# Patient Record
Sex: Female | Born: 1935 | Race: White | Hispanic: No | Smoking: Never smoker
Health system: Southern US, Community
[De-identification: ages and names within clinical notes are randomized; demographics above are authoritative.]

## PROBLEM LIST (undated history)

## (undated) DIAGNOSIS — IMO0001 Reserved for inherently not codable concepts without codable children: Secondary | ICD-10-CM

## (undated) DIAGNOSIS — F0280 Dementia in other diseases classified elsewhere without behavioral disturbance: Secondary | ICD-10-CM

## (undated) DIAGNOSIS — G309 Alzheimer's disease, unspecified: Secondary | ICD-10-CM

## (undated) DIAGNOSIS — M4850XA Collapsed vertebra, not elsewhere classified, site unspecified, initial encounter for fracture: Secondary | ICD-10-CM

## (undated) DIAGNOSIS — F209 Schizophrenia, unspecified: Secondary | ICD-10-CM

## (undated) DIAGNOSIS — I10 Essential (primary) hypertension: Secondary | ICD-10-CM

## (undated) DIAGNOSIS — G8929 Other chronic pain: Secondary | ICD-10-CM

## (undated) DIAGNOSIS — K449 Diaphragmatic hernia without obstruction or gangrene: Secondary | ICD-10-CM

## (undated) DIAGNOSIS — K219 Gastro-esophageal reflux disease without esophagitis: Secondary | ICD-10-CM

## (undated) DIAGNOSIS — M549 Dorsalgia, unspecified: Secondary | ICD-10-CM

## (undated) DIAGNOSIS — A0472 Enterocolitis due to Clostridium difficile, not specified as recurrent: Secondary | ICD-10-CM

## (undated) HISTORY — PX: CAROTID ENDARTERECTOMY: SUR193

## (undated) HISTORY — PX: CAROTID STENT: SHX1301

## (undated) HISTORY — PX: LUNG REMOVAL, PARTIAL: SHX233

## (undated) HISTORY — PX: OTHER SURGICAL HISTORY: SHX169

---

## 2006-10-04 ENCOUNTER — Inpatient Hospital Stay (HOSPITAL_COMMUNITY): Admission: EM | Admit: 2006-10-04 | Discharge: 2006-10-10 | Payer: Self-pay | Admitting: Emergency Medicine

## 2006-11-03 ENCOUNTER — Emergency Department (HOSPITAL_COMMUNITY): Admission: EM | Admit: 2006-11-03 | Discharge: 2006-11-03 | Payer: Self-pay | Admitting: Emergency Medicine

## 2010-04-30 ENCOUNTER — Emergency Department (HOSPITAL_COMMUNITY): Admission: EM | Admit: 2010-04-30 | Discharge: 2010-04-30 | Payer: Self-pay | Admitting: Emergency Medicine

## 2010-10-06 ENCOUNTER — Encounter: Payer: Self-pay | Admitting: Orthopaedic Surgery

## 2010-11-30 LAB — CBC
Hemoglobin: 14 g/dL (ref 12.0–15.0)
MCH: 32.5 pg (ref 26.0–34.0)
MCV: 96.3 fL (ref 78.0–100.0)
Platelets: 101 10*3/uL — ABNORMAL LOW (ref 150–400)
RBC: 4.3 MIL/uL (ref 3.87–5.11)
RDW: 14.3 % (ref 11.5–15.5)

## 2010-11-30 LAB — DIFFERENTIAL
Basophils Absolute: 0 10*3/uL (ref 0.0–0.1)
Eosinophils Absolute: 0.1 10*3/uL (ref 0.0–0.7)
Eosinophils Relative: 2 % (ref 0–5)
Lymphocytes Relative: 17 % (ref 12–46)
Lymphs Abs: 1.3 10*3/uL (ref 0.7–4.0)
Monocytes Absolute: 0.8 10*3/uL (ref 0.1–1.0)
Monocytes Relative: 10 % (ref 3–12)
Neutro Abs: 5.5 10*3/uL (ref 1.7–7.7)
Neutrophils Relative %: 71 % (ref 43–77)

## 2010-11-30 LAB — COMPREHENSIVE METABOLIC PANEL
Albumin: 3.6 g/dL (ref 3.5–5.2)
BUN: 17 mg/dL (ref 6–23)
CO2: 31 mEq/L (ref 19–32)
Calcium: 9.4 mg/dL (ref 8.4–10.5)
GFR calc non Af Amer: >60 mL/min (ref >60)
Total Protein: 6.8 g/dL (ref 6.0–8.3)

## 2010-11-30 LAB — URINE CULTURE
Culture  Setup Time: 201108162045
Culture: NO GROWTH

## 2010-11-30 LAB — URINE MICROSCOPIC-ADD ON

## 2010-11-30 LAB — URINALYSIS, ROUTINE W REFLEX MICROSCOPIC
Bilirubin Urine: NEGATIVE
Nitrite: NEGATIVE
pH: 6 (ref 5.0–8.0)

## 2011-02-01 NOTE — Discharge Summary (Signed)
NAMEKIMBERLEE, [name] NO.:  [id number]   MEDICAL RECORD NO.:  [id number]          PATIENT TYPE:  INP   LOCATION:  3021                         FACILITY:  [facility]   PHYSICIAN:  Lonia Blood, M.D.       DATE OF BIRTH:  1923/11/24   DATE OF ADMISSION:  10/03/2006  DATE OF DISCHARGE:                               DISCHARGE SUMMARY   The patient's primary care physician is in [place], [place].   DISCHARGE DIAGNOSES:  1. Clostridium difficile colitis.  2. Alzheimer dementia.  3. Psychosis, not otherwise specified.  4. Hypertension.  5. Hyperlipidemia.  6. Status post left lung resection.  7. Status post right carotid endarterectomy December 2007.  8. Hypokalemia secondary to diarrhea, repleting.  9. Anemia of chronic disease.   MEDICATIONS:  1. Megace 400 mg daily.  2. Metronidazole 500 mg by mouth 3 times a day with a stop date of      October 26, 2006.  3. Seroquel 25 mg in the morning and 50 mg at bedtime.  4. Ensure one can four times a day.  5. Tylenol 650 mg by mouth every 4 hours as needed for pain.   CONDITION ON DISCHARGE:  The disposition is still unclear for this  patient.  Discussions are still ongoing with the daughter about the  patient returning home versus placement in assisted living versus short-  term skilled nursing home placement.   CONSULTATIONS DURING THIS ADMISSION:  We have requested a psychiatry  consultation for competency evaluation.   PROCEDURES DURING THIS ADMISSION:  1. On October 04, 2006, the patient underwent abdominal ultrasound      with finding of gallstones, bilateral renal cysts.  2. Chest x-ray October 06, 2005, with findings of left basilar      postoperative changes and scarring, no definite acute findings.   For admission history and physical, refer to the dictated H&P done by  Dr. Lovell Sheehan October 04, 2006.   HOSPITAL COURSE:  Problem 1.  DIARRHEA AND ABDOMINAL PAIN:  [name] was admitted to  [facility] with a presumed diagnosis of Clostridium difficile  colitis.  The diagnosis was confirmed shortly after admission by a  positive Clostridium difficile toxin assay.  [name] was taken off of  any offending antibiotics and she was kept on Flagyl orally throughout  the hospitalization .   Problem 2.  DEMENTIA WITH HISTORY OF PSYCHOSIS:  During this  hospitalization we have decreased the dose of Seroquel that [name]  was using because of increasing sedation.  Shortly after doing that, the  patient started having some paranoid ideations and we have obtained a  psychiatry consult.  The consult is pending at the time of my dictation.   Problem 3.  HYPERTENSION:  The patient's blood pressure has been  excellently controlled without any interventions.   Problem 4.  HYPOKALEMIA:  This was secondary to diarrhea and the  patient's potassium is getting repleted as we speak.   Problem 5.  The disposition is very unclear for this patient.  She has a  complicated social situation  with a history of placement in a long-term  facility where apparently the conditions were borderline acceptable.  Currently the patient's daughter and health care power of attorney is  attempting to place the patient in assisted living [place]  where there is another daughter living.      Lonia Blood, M.D.  Electronically Signed     SL/MEDQ  D:  10/07/2006  T:  10/07/2006  Job:  [id number]

## 2011-02-01 NOTE — H&P (Signed)
[name], [name]                  ACCOUNT NO.:  [id number]   MEDICAL RECORD NO.:  [id number]          PATIENT TYPE:  INP   LOCATION:  1828                         FACILITY:  [facility]   PHYSICIAN:  Della Goo, M.D. DATE OF BIRTH:  1923-11-13   DATE OF ADMISSION:  10/03/2006  DATE OF DISCHARGE:                              HISTORY & PHYSICAL   CHIEF COMPLAINT:  Abdominal pain.   HISTORY OF PRESENT ILLNESS:  This is an 77 year old female who was  brought to the emergency department by her daughter secondary to  complaints of abdominal pain and continued diarrhea.  The patient has  had diarrhea since January 2 and prior to this had a diarrheal illness  for several weeks and was hospitalized in a hospital in [place],  [place], secondary to severe dehydration.  The patient's daughter  reports the diarrhea is not as severe as before; however, it is foul-  smelling.  The patient recently moved in with her daughter from an  assisted living facility in [place], [place], and the patient's  daughter reports the patient has had decreased p.o. intake, fatigue and  lethargy during the past week.  The patient is able to give some of her  history of complaints.  However, most of the history has been obtained  from the patient's daughter.  The patient does have a history of  Alzheimer's dementia.   PAST MEDICAL HISTORY:  1. Alzheimer's dementia.  2. Hyperlipidemia.  3. Hypertension.  4. Osteopenia.   PAST SURGICAL HISTORY:  1. Status post left lung resection secondary to a benign nodule.  2. Status post right carotid endarterectomy December26,2007.   MEDICATIONS:  1. Depakote 250 mg, this is the extended-release tablets, one p.o.      b.i.d.  2. Triamterene/hydrochlorothiazide 37.5/25 mg one p.o. q.a.m.  3. Zocor 20 mg one p.o. daily.  4. Seroquel 50 mg one p.o. q.a.m. and 100 mg p.o. q.h.s.  5. Aricept 5 mg one p.o. daily.  6. Potassium chloride 20 mEq one p.o. daily.  7. Colace  had been discontinued secondary to the patient's diarrhea.   ALLERGIES:  CODEINE.   SOCIAL HISTORY:  The patient is currently living with her [place]  No history of tobacco or alcohol usage.   FAMILY HISTORY:  Noncontributory.   REVIEW OF SYSTEMS:  Pertinents are mentioned above.   LABORATORY STUDIES:  White blood cell count 11.6, hemoglobin 10.9,  hematocrit 32.6, platelets 380, neutrophils 79%, lymphocytes 12%.  Urinalysis:  Large leukocyte esterase and positive urine nitrites.  Sodium 138, potassium 4.2, chloride 96, CO2 32, BUN 19, creatinine 0.99,  glucose 116, AST 21, ALT 12.  Lipase 112.  Valproic acid 11.5.   ASSESSMENT:  An 77 year old female admitted with complaints of abdominal  pain, found to have:   1. A urinary tract infection.  2. Chronic diarrhea, rule out Clostridium difficile.  3. Dehydration.  4. Normocytic anemia.  5. History of Alzheimer's.   PLAN:  The patient will be admitted and placed on IV antibiotic therapy  of Rocephin and Cipro.  She will  be placed on IV fluids for rehydration  therapy.  Stool will be sent for C. difficile and C&S.  The antibiotic  treatment will be further adjusted pending culture results.  The patient  will continue on her regular medications at this time.      Della Goo, M.D.  Electronically Signed     HJ/MEDQ  D:  10/04/2006  T:  10/04/2006  Job:  [id number]

## 2011-02-01 NOTE — Discharge Summary (Signed)
[name], [name] NO.:  [id number]   MEDICAL RECORD NO.:  [id number]          PATIENT TYPE:  INP   LOCATION:  3021                         FACILITY:  [facility]   PHYSICIAN:  Marcellus Scott, MD     DATE OF BIRTH:  1923/11/11   DATE OF ADMISSION:  10/03/2006  DATE OF DISCHARGE:  10/10/2006                               DISCHARGE SUMMARY   ADDENDUM AND FINAL DISCHARGE SUMMARY:  The patient's primary care  physician is in [place], [place], so the patient is unassigned to the  [employer].   This is an addendum discharge summary which will outline the events of  this patient's inpatient care from October 08, 2006, to date.  For  details of her inpatient care prior to that, please refer to the history  and physical note done on October 04, 2006, and the interim discharge  summary that was done by Dr. Lavera Guise on October 07, 2006.   DISCHARGE DIAGNOSES:  1. Clostridium difficile colitis.  2. Hypokalemia.  3. Hypomagnesemia.  4. Anemia.  5. Dyslipidemia.  6. 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  7. Dementia.  8. Hypertension.   MEDICATIONS:  1. Depakote Extended Release 250 mg orally twice daily.  2. Zocor 20 mg p.o. q.h.s.  3. FloraQ one capsule p.o. b.i.d. through Oct 26, 2006  4. Pepcid 20 mg p.o. daily.  5. Nu-Iron 150 mg p.o. daily.  6. Megace 400 mg p.o. daily.  7. Metronidazole 500 mg p.o. three times a day through October 26, 2006.  8. Seroquel 25 mg p.o. daily and 50 mg p.o. q.h.s.  9. Aricept 10 mg p.o. q.h.s.  10.Ensure one can p.o. four times a day.  11.Tylenol 650 mg p.o. q.4-6h. p.r.n. for pain.   PROCEDURES DONE SINCE JANUARY 22:  None.   CONSULTATIONS:  1. Psychiatry, Antonietta Breach, M.D.  2. Speech therapy.  3. Physical therapy and occupational therapy.   HOSPITAL COURSE AND CONDITION OF PATIENT ON DISCHARGE:  Since January  22, the patient's diarrhea was noted to progressively decrease and today  she  has only had one BM, which was formed stool, over the last 24 hours.  The patient says she is feeling much better.  She has remained afebrile  with no leukocytosis, and the patient is to complete her course of  Flagyl.  Her Sherre Lain can also be continued until February 10 and then  discontinued.  The patient accidentally received a single dose of  Protonix, Lasix and Renagel on the 23rd.  Her input-outputs were closely  monitored.  The patient was also briefly placed on intravenous fluid  hydration.  Her electrolytes of hypokalemia and hypomagnesemia were  checked and repleted.  Her daughter was informed of this event and she  verbalized understanding.  The patient was also seen by psychiatry on  October 08, 2006.  Please refer to their consult note for further  details.  They essentially assessed that the patient did not have the  capacity for informed consent and they suggested to avoid excess  Seroquel because of  side effects and suggested increase only for return  of hallucinations and delusions. If there was any increase in agitation  only, they suggested increasing the dose of Depakote.  They also  recommended following her CBCs and hepatic panel periodically while on  Depakote.  They also suggested increasing her Aricept to 10 mg p.o.  q.h.s., which has been done.  The patient has not demonstrated any  further hallucinations or delusions or agitation at this time.  Her  anemia workup is outlined as below.  It has been stable and further  workup can be done as an outpatient as deemed necessary by her primary  care physicians.   The patient had a speech and swallow evaluation, which was initially  suboptimal because of her being sedated on Seroquel.  A repeat swallow  evaluation was done today, January 25.  Please refer to their  recommendations for details, but they have suggested a regular/thin diet  and no further speech therapy evaluation.  She was also evaluated by  physical  therapy and occupational therapy, and they are okay with the  patient being transferred to an assisted living facility as expressed by  her daughter and health care power of attorney.  I have been calling the  patient's daughter on a daily basis, updating her on her mother's  progress in the hospital, and she has verbalized understanding.  The  patient carries a diagnosis of hypertension; however, thiazide diuretics  have been held in the hospital secondary to her presenting with diarrhea  and hypokalemia.  Even now the blood pressures are controlled well  without any medications.  This has to be reassessed as an outpatient and  if requiring antihypertensives, thiazide diuretics are probably not the  best choice for this elderly woman who is prone for multiple electrolyte  abnormalities.   LABORATORY DATA:  Most recent CBC:  Hemoglobin of 9.9, hematocrit of  28.9, MCV of 87.8, white blood cells 6.9, platelets of 303.  Her basic  metabolic panel:  Sodium 141, potassium 3.5, chloride 103, bicarb 29,  glucose 110, BUN 4, creatinine 0.76, calcium of 8.8.  Magnesium of 1.8.  Phosphorus of 2.5.  TSH of 0.852.  Anemia workup with iron less than 10,  a ferritin of 417, suggesting an anemia of chronic disease, vitamin B12  of 1884, folate of 953.  Her stool culture is negative.  Urine cultures  were negative.  Stool C. difficile was positive.      Marcellus Scott, MD  Electronically Signed     AH/MEDQ  D:  10/10/2006  T:  10/10/2006  Job:  [id number]

## 2012-01-24 DIAGNOSIS — R109 Unspecified abdominal pain: Secondary | ICD-10-CM | POA: Diagnosis not present

## 2012-01-24 DIAGNOSIS — R82998 Other abnormal findings in urine: Secondary | ICD-10-CM | POA: Diagnosis not present

## 2012-01-24 DIAGNOSIS — N952 Postmenopausal atrophic vaginitis: Secondary | ICD-10-CM | POA: Diagnosis not present

## 2012-03-10 DIAGNOSIS — N281 Cyst of kidney, acquired: Secondary | ICD-10-CM | POA: Diagnosis not present

## 2012-03-10 DIAGNOSIS — R82998 Other abnormal findings in urine: Secondary | ICD-10-CM | POA: Diagnosis not present

## 2012-03-10 DIAGNOSIS — N952 Postmenopausal atrophic vaginitis: Secondary | ICD-10-CM | POA: Diagnosis not present

## 2012-07-21 DIAGNOSIS — R109 Unspecified abdominal pain: Secondary | ICD-10-CM | POA: Diagnosis not present

## 2012-07-21 DIAGNOSIS — N952 Postmenopausal atrophic vaginitis: Secondary | ICD-10-CM | POA: Diagnosis not present

## 2012-07-29 DIAGNOSIS — F200 Paranoid schizophrenia: Secondary | ICD-10-CM | POA: Diagnosis not present

## 2012-08-26 DIAGNOSIS — F200 Paranoid schizophrenia: Secondary | ICD-10-CM | POA: Diagnosis not present

## 2012-10-02 ENCOUNTER — Encounter (HOSPITAL_COMMUNITY): Payer: Self-pay | Admitting: *Deleted

## 2012-10-02 ENCOUNTER — Emergency Department (HOSPITAL_COMMUNITY)
Admission: EM | Admit: 2012-10-02 | Discharge: 2012-10-03 | DRG: 379 | Disposition: A | Discharge status: Home / Self Care | Payer: Medicare Other | Attending: Emergency Medicine | Admitting: Emergency Medicine

## 2012-10-02 DIAGNOSIS — F209 Schizophrenia, unspecified: Secondary | ICD-10-CM | POA: Diagnosis present

## 2012-10-02 DIAGNOSIS — K625 Hemorrhage of anus and rectum: Secondary | ICD-10-CM | POA: Diagnosis present

## 2012-10-02 DIAGNOSIS — K219 Gastro-esophageal reflux disease without esophagitis: Secondary | ICD-10-CM | POA: Diagnosis present

## 2012-10-02 DIAGNOSIS — G309 Alzheimer's disease, unspecified: Secondary | ICD-10-CM | POA: Diagnosis present

## 2012-10-02 DIAGNOSIS — F0280 Dementia in other diseases classified elsewhere without behavioral disturbance: Secondary | ICD-10-CM | POA: Diagnosis not present

## 2012-10-02 DIAGNOSIS — K922 Gastrointestinal hemorrhage, unspecified: Secondary | ICD-10-CM

## 2012-10-02 DIAGNOSIS — I10 Essential (primary) hypertension: Secondary | ICD-10-CM | POA: Diagnosis present

## 2012-10-02 DIAGNOSIS — Z79899 Other long term (current) drug therapy: Secondary | ICD-10-CM | POA: Diagnosis not present

## 2012-10-02 DIAGNOSIS — Z7982 Long term (current) use of aspirin: Secondary | ICD-10-CM | POA: Diagnosis not present

## 2012-10-02 DIAGNOSIS — F0390 Unspecified dementia without behavioral disturbance: Secondary | ICD-10-CM | POA: Diagnosis present

## 2012-10-02 HISTORY — DX: Essential (primary) hypertension: I10

## 2012-10-02 HISTORY — DX: Gastro-esophageal reflux disease without esophagitis: K21.9

## 2012-10-02 HISTORY — DX: Dementia in other diseases classified elsewhere, unspecified severity, without behavioral disturbance, psychotic disturbance, mood disturbance, and anxiety: F02.80

## 2012-10-02 HISTORY — DX: Schizophrenia, unspecified: F20.9

## 2012-10-02 HISTORY — DX: Alzheimer's disease, unspecified: G30.9

## 2012-10-02 HISTORY — DX: Reserved for inherently not codable concepts without codable children: IMO0001

## 2012-10-02 LAB — BASIC METABOLIC PANEL
BUN: 18 mg/dL (ref 6–23)
CO2: 32 mEq/L (ref 19–32)
Calcium: 9.9 mg/dL (ref 8.4–10.5)
Chloride: 100 mEq/L (ref 96–112)
GFR calc Af Amer: 63 mL/min — ABNORMAL LOW (ref >90)
Glucose, Bld: 116 mg/dL — ABNORMAL HIGH (ref 70–99)
Potassium: 3.7 mEq/L (ref 3.5–5.1)
Sodium: 137 mEq/L (ref 135–145)

## 2012-10-02 LAB — CBC WITH DIFFERENTIAL/PLATELET
Band Neutrophils: 0 % (ref 0–10)
Eosinophils Relative: 3 % (ref 0–5)
HCT: 36.1 % (ref 36.0–46.0)
Hemoglobin: 12.1 g/dL (ref 12.0–15.0)
Lymphs Abs: 1.9 10*3/uL (ref 0.7–4.0)
MCV: 90.5 fL (ref 78.0–100.0)
Monocytes Absolute: 0.6 10*3/uL (ref 0.1–1.0)
Promyelocytes Absolute: 0 %
RBC: 3.99 MIL/uL (ref 3.87–5.11)
RDW: 12.1 % (ref 11.5–15.5)
nRBC: 0 /100 WBC

## 2012-10-02 NOTE — ED Provider Notes (Addendum)
History     CSN: [id number]  Arrival date & time 10/02/12  2123   First MD Initiated Contact with Patient 10/02/12 2310      Chief Complaint  Patient presents with  . Vaginal Bleeding    (Consider location/radiation/quality/duration/timing/severity/associated sxs/prior treatment) HPI  Level 5 caveat due to dementia Lawonda Pretlow Racette is a 79 y.o. female who presents to the Emergency Department complaining of vaginal bleeding that began an hour ago.Per daughter she had bleeding from either the rectum or the vagina. When the daughter looked she thought it was coming from the vagina. Patient has had normal stools for the past several days. Denies any recent fever, chills, vomiting, c/o abdominal pain. Patient has dementia and schizophrenia. Requires total care which is being provided in the home.   PCP Dr. Janna Arch Past Medical History  Diagnosis Date  . Hypertension   . Reflux   . Schizophrenia   . Alzheimer disease     Past Surgical History  Procedure Date  . Carotid stent   . Tumor of lung -benign , and removed.     History reviewed. No pertinent family history.  History  Substance Use Topics  . Smoking status: Never Smoker   . Smokeless tobacco: Not on file  . Alcohol Use: No    OB History    Grav Para Term Preterm Abortions TAB SAB Ect Mult Living                  Review of Systems  Unable to perform ROS: Dementia    Allergies  Codeine  Home Medications   Current Outpatient Rx  Name  Route  Sig  Dispense  Refill  . ASPIRIN EC 81 MG PO TBEC   Oral   Take 81 mg by mouth every evening.         Marland Kitchen ESTRADIOL 2 MG VA RING   Vaginal   Place 2 mg vaginally every 3 (three) months. follow package directions         . LISINOPRIL-HYDROCHLOROTHIAZIDE 20-12.5 MG PO TABS   Oral   Take 1 tablet by mouth daily.         . ADULT MULTIVITAMIN W/MINERALS CH   Oral   Take 1 tablet by mouth daily.         Marland Kitchen OLANZAPINE 5 MG PO TBDP   Oral   Take 5 mg by mouth  at bedtime.         Marland Kitchen PANTOPRAZOLE SODIUM 20 MG PO TBEC   Oral   Take 20 mg by mouth 2 (two) times daily.         Marland Kitchen VITAMIN C 500 MG PO TABS   Oral   Take 500 mg by mouth daily.           BP 141/66  Pulse 64  Temp 98.4 F (36.9 C) (Oral)  Resp 20  Ht 5\' 1"  (1.549 m)  Wt 128 lb (58.06 kg)  BMI 24.19 kg/m2  SpO2 95%  Physical Exam  Constitutional: She appears well-developed and well-nourished.  HENT:  Head: Normocephalic and atraumatic.  Right Ear: External ear normal.  Left Ear: External ear normal.  Mouth/Throat: Oropharynx is clear and moist.  Eyes: EOM are normal. Pupils are equal, round, and reactive to light.  Neck: Normal range of motion.  Cardiovascular: Normal heart sounds.   Pulmonary/Chest: Breath sounds normal.  Abdominal: Bowel sounds are normal.  Genitourinary:       Pelvic exam performed with permission of  daughter  and female ED tech assist during exam.  External genitalia w/o lesions. Vaginal vault without discharge.  Cervix w/o lesions, not friable, No blood in the vaginal vault or from the cervix.   Bimanual exam w/o CMT, uterine or adenexal tenderness.Rectal exam with soft stool in the rectal vault, brown, guaiac positive.   Musculoskeletal: Normal range of motion.  Neurological: She is alert. She has normal reflexes.    ED Course  Procedures (including critical care time)  Results for orders placed during the hospital encounter of 10/02/12  CBC WITH DIFFERENTIAL      Component Value Range   WBC 7.2  4.0 - 10.5 K/uL   RBC 3.99  3.87 - 5.11 MIL/uL   Hemoglobin 12.1  12.0 - 15.0 g/dL   HCT 09.8  11.9 - 14.7 %   MCV 90.5  78.0 - 100.0 fL   MCH 30.3  26.0 - 34.0 pg   MCHC 33.5  30.0 - 36.0 g/dL   RDW 82.9  56.2 - 13.0 %   Platelets 231  150 - 400 K/uL   Neutrophils Relative 60  43 - 77 %   Lymphocytes Relative 27  12 - 46 %   Monocytes Relative 9  3 - 12 %   Eosinophils Relative 3  0 - 5 %   Basophils Relative 1  0 - 1 %   Band  Neutrophils 0  0 - 10 %   Metamyelocytes Relative 0     Myelocytes 0     Promyelocytes Absolute 0     Blasts 0     nRBC 0  0 /100 WBC   Neutro Abs 4.4  1.7 - 7.7 K/uL   Lymphs Abs 1.9  0.7 - 4.0 K/uL   Monocytes Absolute 0.6  0.1 - 1.0 K/uL   Eosinophils Absolute 0.2  0.0 - 0.7 K/uL   Basophils Absolute 0.1  0.0 - 0.1 K/uL  BASIC METABOLIC PANEL      Component Value Range   Sodium 137  135 - 145 mEq/L   Potassium 3.7  3.5 - 5.1 mEq/L   Chloride 100  96 - 112 mEq/L   CO2 32  19 - 32 mEq/L   Glucose, Bld 116 (*) 70 - 99 mg/dL   BUN 18  6 - 23 mg/dL   Creatinine, Ser 8.65  0.50 - 1.10 mg/dL   Calcium 9.9  8.4 - 78.4 mg/dL   GFR calc non Af Amer 54 (*) >90 mL/min   GFR calc Af Amer 63 (*) >90 mL/min   12:04 AM:  T/C to Dr. Phillips Odor, hospitalist, case discussed, including:  HPI, pertinent PM/SHx, VS/PE, dx testing, ED course and treatment.  Agreeable to admission.  Requests to write temporary orders, telemetry bed.   MDM  Patient presents with what was thought to be vaginal bleeding. On physical exam bleeding was from the rectum, brown stool which guaiaced positive. Vital signs are stable. Hgb is stable at 12.1. Will arrange for admission for further work up of possible GI bleeding.Spoke with Dr. Phillips Odor, hospitalist, who will admit the patient. Pt stable in ED with no significant deterioration in condition.The patient appears reasonably stabilized for admission considering the current resources, flow, and capabilities available in the ED at this time, and I doubt any other [facility] requiring further screening and/or treatment in the ED prior to admission.  MDM Reviewed: nursing note and vitals Interpretation: labs     Nicoletta Dress. Colon Branch, MD 10/03/12 6962  [id number] Discussed  patient with Dr. Phillips Odor. She has spent time with the daughter and discussed the pros and cons of admission. Daughter would like to take the patient home. Dr. Phillips Odor has approved of discharge home. Referral will be made to  GI. Daughter advised to return to the ER if bleeding resumes.   Nicoletta Dress. Colon Branch, MD 10/03/12 [phone]

## 2012-10-02 NOTE — ED Notes (Addendum)
Vaginal bleeding. X 1 hour.  Hx of vaginal dryness and uses a estrogen ring to treat pt.Last placed in October. Pt sleeping in triage, daughter says she took her schizophrenia med tonight.

## 2012-10-03 DIAGNOSIS — K922 Gastrointestinal hemorrhage, unspecified: Secondary | ICD-10-CM

## 2012-10-03 DIAGNOSIS — K625 Hemorrhage of anus and rectum: Secondary | ICD-10-CM | POA: Diagnosis present

## 2012-10-03 DIAGNOSIS — F209 Schizophrenia, unspecified: Secondary | ICD-10-CM | POA: Diagnosis present

## 2012-10-03 DIAGNOSIS — F0390 Unspecified dementia without behavioral disturbance: Secondary | ICD-10-CM | POA: Diagnosis present

## 2012-10-03 NOTE — Progress Notes (Signed)
Call received from patient's daughter that when she got patient home bleeding reoccurred -when she inspected where the blood was coming from she clearly feels that it is vaginal, despite a normal but technically challenging exam in the ED this PM- patient had no blood around rectum no bowel movements and when daughter looked at the area.blood was pooled in vaginal introitus. She is on Estring for UTI prevention and is at risk for postmenopausal uterine hypertrophy on estrogen and also bleeding. I advised patient's daughter to have the ring removed ASAP and have complete evaluation by [facility] who is the prescriber. I again educated her on signs that her mother may be having clinically significant blood loss-ie soaking 2-3 pads in an hour or any maroon or bloody loose stools. She will follow up with PCP and call urology on Monday AM.

## 2012-10-03 NOTE — ED Notes (Signed)
Pts daughter spoke with EDP and decided to wait to speak with Dr. Phillips Odor prior to deciding if her mother should be admitted or not

## 2012-10-03 NOTE — ED Notes (Signed)
Await further orders as pts daughter would like to speak with admitting MD prior to making her decision about her mother being admitted

## 2012-10-03 NOTE — ED Notes (Signed)
When EDP performed rectal exam and checked hemocult, the hemocult was positive

## 2012-10-03 NOTE — Consult Note (Addendum)
Admission requested for [name] who was brought in by her daughter for BRBPR that occurred this afternoon. Admission accepted but when I arrived to the room the patient's daughter was questioning whether she wanted her be admitted. We had a detailed discussion about the patients overall condition which includes advanced dementia and behavioral disturbance secondary to schizophrenia. She has a long and complicated medical history and per the daughter it has taken them years to get her controlled enough to be at home- she also is frightened of her mother getting a hospital acquired infection-she has her mother wearing a mask and gloves in the ED room. She has history of hospital aquired C. Diff that per daughter "almost killed her". Since being in the ED she has had no more rectal bleeding-history is consistent with either hemorrhoid rupture or fissure, nothing to suggest brisk bleed or diverticular bleed. She had severe constipation about 4-5 days ago which required enemas and aggressive bowel regimen-so there may have been some trauma associated with that. Patient also starting to show signs of delirium and worsening confusion.  Recommendation: 1. Probably safe to discharge home-I educated daughter on red flag signs and symptoms and to return if significant re-bleed occurs.  2. Daily Stool Softner OTC Colace.  3. Consider outpatient GI consultation if problem continues- we discussed risks and benefits of aggressive care vs. Comfort care and QOL.   4. Hold ASA for 3 days.  Discussed with Dr. Colon Branch who will complete d/c.

## 2012-10-05 LAB — OCCULT BLOOD, POC DEVICE: Fecal Occult Bld: POSITIVE — AB

## 2012-11-23 DIAGNOSIS — F200 Paranoid schizophrenia: Secondary | ICD-10-CM | POA: Diagnosis not present

## 2013-02-01 DIAGNOSIS — E212 Other hyperparathyroidism: Secondary | ICD-10-CM | POA: Diagnosis not present

## 2013-02-01 DIAGNOSIS — M1990 Unspecified osteoarthritis, unspecified site: Secondary | ICD-10-CM | POA: Diagnosis not present

## 2013-02-01 DIAGNOSIS — I119 Hypertensive heart disease without heart failure: Secondary | ICD-10-CM | POA: Diagnosis not present

## 2013-02-01 DIAGNOSIS — L0390 Cellulitis, unspecified: Secondary | ICD-10-CM | POA: Diagnosis not present

## 2013-02-01 DIAGNOSIS — L0291 Cutaneous abscess, unspecified: Secondary | ICD-10-CM | POA: Diagnosis not present

## 2013-02-03 DIAGNOSIS — I739 Peripheral vascular disease, unspecified: Secondary | ICD-10-CM | POA: Diagnosis not present

## 2013-02-15 ENCOUNTER — Emergency Department (HOSPITAL_COMMUNITY): Payer: Medicare Other

## 2013-02-15 ENCOUNTER — Encounter (HOSPITAL_COMMUNITY): Payer: Self-pay | Admitting: *Deleted

## 2013-02-15 ENCOUNTER — Emergency Department (HOSPITAL_COMMUNITY)
Admission: EM | Admit: 2013-02-15 | Discharge: 2013-02-15 | Disposition: A | Discharge status: Home / Self Care | Payer: Medicare Other | Attending: Emergency Medicine | Admitting: Emergency Medicine

## 2013-02-15 DIAGNOSIS — I10 Essential (primary) hypertension: Secondary | ICD-10-CM | POA: Insufficient documentation

## 2013-02-15 DIAGNOSIS — Z043 Encounter for examination and observation following other accident: Secondary | ICD-10-CM | POA: Diagnosis not present

## 2013-02-15 DIAGNOSIS — W19XXXA Unspecified fall, initial encounter: Secondary | ICD-10-CM

## 2013-02-15 DIAGNOSIS — Y9389 Activity, other specified: Secondary | ICD-10-CM | POA: Insufficient documentation

## 2013-02-15 DIAGNOSIS — F0280 Dementia in other diseases classified elsewhere without behavioral disturbance: Secondary | ICD-10-CM | POA: Insufficient documentation

## 2013-02-15 DIAGNOSIS — W1809XA Striking against other object with subsequent fall, initial encounter: Secondary | ICD-10-CM | POA: Insufficient documentation

## 2013-02-15 DIAGNOSIS — S0990XA Unspecified injury of head, initial encounter: Secondary | ICD-10-CM | POA: Insufficient documentation

## 2013-02-15 DIAGNOSIS — G8929 Other chronic pain: Secondary | ICD-10-CM | POA: Diagnosis not present

## 2013-02-15 DIAGNOSIS — K219 Gastro-esophageal reflux disease without esophagitis: Secondary | ICD-10-CM | POA: Insufficient documentation

## 2013-02-15 DIAGNOSIS — M549 Dorsalgia, unspecified: Secondary | ICD-10-CM | POA: Diagnosis not present

## 2013-02-15 DIAGNOSIS — Z7982 Long term (current) use of aspirin: Secondary | ICD-10-CM | POA: Diagnosis not present

## 2013-02-15 DIAGNOSIS — Z87828 Personal history of other (healed) physical injury and trauma: Secondary | ICD-10-CM | POA: Diagnosis not present

## 2013-02-15 DIAGNOSIS — Z79899 Other long term (current) drug therapy: Secondary | ICD-10-CM | POA: Insufficient documentation

## 2013-02-15 DIAGNOSIS — S79919A Unspecified injury of unspecified hip, initial encounter: Secondary | ICD-10-CM | POA: Diagnosis not present

## 2013-02-15 DIAGNOSIS — Y9289 Other specified places as the place of occurrence of the external cause: Secondary | ICD-10-CM | POA: Insufficient documentation

## 2013-02-15 DIAGNOSIS — F209 Schizophrenia, unspecified: Secondary | ICD-10-CM | POA: Diagnosis not present

## 2013-02-15 DIAGNOSIS — T1490XA Injury, unspecified, initial encounter: Secondary | ICD-10-CM | POA: Diagnosis not present

## 2013-02-15 DIAGNOSIS — S79929A Unspecified injury of unspecified thigh, initial encounter: Secondary | ICD-10-CM | POA: Diagnosis not present

## 2013-02-15 DIAGNOSIS — G309 Alzheimer's disease, unspecified: Secondary | ICD-10-CM | POA: Insufficient documentation

## 2013-02-15 DIAGNOSIS — W010XXA Fall on same level from slipping, tripping and stumbling without subsequent striking against object, initial encounter: Secondary | ICD-10-CM | POA: Insufficient documentation

## 2013-02-15 HISTORY — DX: Other chronic pain: G89.29

## 2013-02-15 HISTORY — DX: Dorsalgia, unspecified: M54.9

## 2013-02-15 NOTE — ED Notes (Signed)
Pt to department via EMS.  Per report, pt fell over trying to put on slippers.  Pt didn't c/o injury to EMS, upon arrival to department, pt c/o back pain.  Pt does have history of back problems.  Pt has history of Alzheimer's, and is poor historian.  Family expected to arrive in department.  Not available at present.

## 2013-02-15 NOTE — ED Provider Notes (Signed)
History  This chart was scribed for Donnetta Hutching, MD by Ardelia Mems, ED Scribe. This patient was seen in room APA03/APA03 and the patient's care was started at 8:03 PM.   CSN: [id number]  Arrival date & time 02/15/13  1931     Chief Complaint  Patient presents with  . Fall     The history is provided by a relative. The history is limited by the condition of the patient. No language interpreter was used.   Level 5 Caveat secondary to alzheimer's, dementia and schizophrenia.  HPI Comments: [name] is a 79 y.o. Female with a h/o Alzheimer's, dementia, schizophrenia and chronic back pain brought in by EMS who presents to the Emergency Department complaining of a fall that occurred earlier today. Pt is a poor historian. Pt was attempting to put her shoes on and slipped and fell according to relative. Relative states that pt hit her head on the hardwood. Pt normally walks and stands only with assistance. Relative states that pt has a h/o spinal injuries and that her bones are brittle. Relative states that pt's behavior is normal today and that pt is heavily medicated to combat her agitation. Relative seems to be hypervigilant and also states that pt's breathing is not normal today. Pt has h/o of blockage and stents in both of her carotid arteries.  Past Medical History  Diagnosis Date  . Hypertension   . Reflux   . Schizophrenia   . Alzheimer disease   . Chronic back pain     Past Surgical History  Procedure Laterality Date  . Carotid stent    . Tumor of lung -benign , and removed.      History reviewed. No pertinent family history.  History  Substance Use Topics  . Smoking status: Never Smoker   . Smokeless tobacco: Not on file  . Alcohol Use: No    OB History   Grav Para Term Preterm Abortions TAB SAB Ect Mult Living                  Review of Systems  Unable to perform ROS: Dementia    Allergies  Codeine  Home Medications   Current Outpatient Rx  Name   Route  Sig  Dispense  Refill  . aspirin EC 81 MG tablet   Oral   Take 81 mg by mouth every evening.         Marland Kitchen estradiol (ESTRING) 2 MG vaginal ring   Vaginal   Place 2 mg vaginally every 3 (three) months. follow package directions         . lisinopril-hydrochlorothiazide (PRINZIDE,ZESTORETIC) 20-12.5 MG per tablet   Oral   Take 1 tablet by mouth daily.         . Multiple Vitamin (MULTIVITAMIN WITH MINERALS) TABS   Oral   Take 1 tablet by mouth daily.         Marland Kitchen OLANZapine zydis (ZYPREXA) 5 MG disintegrating tablet   Oral   Take 5 mg by mouth at bedtime.         . pantoprazole (PROTONIX) 20 MG tablet   Oral   Take 20 mg by mouth 2 (two) times daily.         . vitamin C (ASCORBIC ACID) 500 MG tablet   Oral   Take 500 mg by mouth daily.           Triage Vitals: BP 150/94  Pulse 92  Temp(Src) 98 F (36.7 C) (Oral)  Resp 20  SpO2 95%  Physical Exam  Nursing note and vitals reviewed. HENT:  Head: Normocephalic and atraumatic.  Eyes:  Unable.  Neck: Normal range of motion. Neck supple.  Cardiovascular: Normal rate, regular rhythm and normal heart sounds.   Pulmonary/Chest: Effort normal and breath sounds normal.  Abdominal: Soft. Bowel sounds are normal.  Musculoskeletal:  unable  Neurological: She has normal reflexes.  unable  Skin: Skin is warm and dry.  Psychiatric:  unable    ED Course  Procedures (including critical care time)  DIAGNOSTIC STUDIES: Oxygen Saturation is 95% on RA, adequate by my interpretation.    COORDINATION OF CARE: 8:23 PM- Pt and pt's relative informed of plan to receive x-ray of pelvis and CT scan of head.  Dg Pelvis 1-2 Views  02/15/2013   *RADIOLOGY REPORT*  Clinical Data: History of fall.  PELVIS - 1-2 VIEW  Comparison: Pelvic radiograph 04/30/2010.  Findings: The bones appear osteopenic.  No definite acute displaced fracture of the bony pelvic ring or either of the proximal femurs as visualized.  Femoral heads  project over the acetabula bilaterally on this single-view examination.  IMPRESSION: 1.  No acute radiographic abnormality of the bony pelvis.   Original Report Authenticated By: Trudie Reed, M.D.   Ct Head Wo Contrast  02/15/2013   *RADIOLOGY REPORT*  Clinical Data: History of trauma from a fall.  CT HEAD WITHOUT CONTRAST  Technique:  Contiguous axial images were obtained from the base of the skull through the vertex without contrast.  Comparison: Head CT 04/30/2010  Findings: Mild cerebral atrophy.  Patchy and confluent areas of decreased attenuation throughout the deep and periventricular white matter of the cerebral hemispheres bilaterally, compatible with chronic microvascular ischemic disease. No acute displaced skull fractures are identified.  No acute intracranial abnormality. Specifically, no evidence of acute post-traumatic intracranial hemorrhage, no definite regions of acute/subacute cerebral ischemia, no focal mass, mass effect, hydrocephalus or abnormal intra or extra-axial fluid collections.  The visualized paranasal sinuses and mastoids are well pneumatized.  IMPRESSION: 1.  No acute displaced skull fractures or acute intracranial abnormalities. 2.  Mild cerebral atrophy with chronic microvascular ischemic changes in the cerebral white matter redemonstrated, as above.   Original Report Authenticated By: Trudie Reed, M.D.    Labs Reviewed - No data to display No results found.   No diagnosis found.   Date: 02/15/2013  Rate: 52  Rhythm:sinus brady QRS Axis: normal  Intervals: normal  ST/T Wave abnormalities: normal  Conduction Disutrbances: none  Narrative Interpretation: unremarkable     MDM  CT scan of head and plain films of pelvis negative.   Daughter reports normal behavior          I personally performed the services described in this documentation, which was scribed in my presence. The recorded information has been reviewed and is accurate.    Donnetta Hutching, MD 02/15/13 2211

## 2013-02-15 NOTE — ED Notes (Signed)
Family at bedside. Daughter reports that fall was assisted.  Daughter also reports that pt has history of osteoperosis and she is concerned about a fracture.  Daughter further requests that any pain medications be non-narcotic due to pt's problems with constipation.

## 2013-03-29 DIAGNOSIS — F200 Paranoid schizophrenia: Secondary | ICD-10-CM | POA: Diagnosis not present

## 2013-05-12 DIAGNOSIS — R5382 Chronic fatigue, unspecified: Secondary | ICD-10-CM | POA: Diagnosis not present

## 2013-05-12 DIAGNOSIS — I110 Hypertensive heart disease with heart failure: Secondary | ICD-10-CM | POA: Diagnosis not present

## 2013-05-12 DIAGNOSIS — M1990 Unspecified osteoarthritis, unspecified site: Secondary | ICD-10-CM | POA: Diagnosis not present

## 2013-05-12 DIAGNOSIS — E039 Hypothyroidism, unspecified: Secondary | ICD-10-CM | POA: Diagnosis not present

## 2013-07-06 DIAGNOSIS — R5381 Other malaise: Secondary | ICD-10-CM | POA: Diagnosis not present

## 2013-07-06 DIAGNOSIS — E785 Hyperlipidemia, unspecified: Secondary | ICD-10-CM | POA: Diagnosis not present

## 2013-07-06 DIAGNOSIS — D649 Anemia, unspecified: Secondary | ICD-10-CM | POA: Diagnosis not present

## 2013-07-06 DIAGNOSIS — I10 Essential (primary) hypertension: Secondary | ICD-10-CM | POA: Diagnosis not present

## 2013-07-09 DIAGNOSIS — F200 Paranoid schizophrenia: Secondary | ICD-10-CM | POA: Diagnosis not present

## 2013-12-11 ENCOUNTER — Encounter (HOSPITAL_COMMUNITY): Payer: Self-pay | Admitting: Emergency Medicine

## 2013-12-11 ENCOUNTER — Emergency Department (HOSPITAL_COMMUNITY): Payer: Medicare Other

## 2013-12-11 ENCOUNTER — Emergency Department (HOSPITAL_COMMUNITY)
Admission: EM | Admit: 2013-12-11 | Discharge: 2013-12-12 | Disposition: A | Discharge status: Home / Self Care | Payer: Medicare Other | Attending: Emergency Medicine | Admitting: Emergency Medicine

## 2013-12-11 DIAGNOSIS — R109 Unspecified abdominal pain: Secondary | ICD-10-CM | POA: Insufficient documentation

## 2013-12-11 DIAGNOSIS — G309 Alzheimer's disease, unspecified: Secondary | ICD-10-CM | POA: Insufficient documentation

## 2013-12-11 DIAGNOSIS — Z79899 Other long term (current) drug therapy: Secondary | ICD-10-CM | POA: Diagnosis not present

## 2013-12-11 DIAGNOSIS — Z8659 Personal history of other mental and behavioral disorders: Secondary | ICD-10-CM | POA: Diagnosis not present

## 2013-12-11 DIAGNOSIS — R05 Cough: Secondary | ICD-10-CM | POA: Diagnosis not present

## 2013-12-11 DIAGNOSIS — F0280 Dementia in other diseases classified elsewhere without behavioral disturbance: Secondary | ICD-10-CM | POA: Insufficient documentation

## 2013-12-11 DIAGNOSIS — R079 Chest pain, unspecified: Secondary | ICD-10-CM | POA: Diagnosis not present

## 2013-12-11 DIAGNOSIS — R059 Cough, unspecified: Secondary | ICD-10-CM | POA: Diagnosis not present

## 2013-12-11 DIAGNOSIS — I10 Essential (primary) hypertension: Secondary | ICD-10-CM | POA: Diagnosis not present

## 2013-12-11 DIAGNOSIS — G8929 Other chronic pain: Secondary | ICD-10-CM | POA: Insufficient documentation

## 2013-12-11 DIAGNOSIS — K219 Gastro-esophageal reflux disease without esophagitis: Secondary | ICD-10-CM | POA: Diagnosis not present

## 2013-12-11 NOTE — ED Provider Notes (Signed)
CSN: [id number]     Arrival date & time 12/11/13  2009 History   First MD Initiated Contact with Patient 12/11/13 2033     Chief Complaint  Patient presents with  . Cough     (Consider location/radiation/quality/duration/timing/severity/associated sxs/prior Treatment) HPI Comments: [name] is a 80 y.o. Female with a history of advanced alzheimers in need of total care, and is cared for by her daughter in her home.  The caregiver has noticed a wet sounding but non productive cough starting earlier this week and is concerned for the possibility of this becoming a pneumonia given her limited mobility.  She has not had fever or chills or appearance of shortness of breath.  She has been at her baseline regarding activity and level of alertness.  The caregiver also states she has been very careful to keep her exposure to illnesses to a minimum, and has had no illnesses in the past 4 years.  Unfortunately she was exposed to a caregiver this week with a cough.   Daughter also reports she has complaint of abdominal pain frequently in the morning upon first waking, last episode occuring yesterday am.  Caregiver states she only complains however, when she is asked if her abdomen hurts.  She has no vomiting, no diarrhea or fevers.  She has not had symptoms today.  Daughter states she has noticed this pattern since before Christmas.  She has not discussed this with her pcp.     The history is provided by a caregiver. The history is limited by the condition of the patient.    Past Medical History  Diagnosis Date  . Hypertension   . Reflux   . Schizophrenia   . Alzheimer disease   . Chronic back pain    Past Surgical History  Procedure Laterality Date  . Carotid stent    . Tumor of lung -benign , and removed.     History reviewed. No pertinent family history. History  Substance Use Topics  . Smoking status: Never Smoker   . Smokeless tobacco: Not on file  . Alcohol Use: No   OB History    Grav Para Term Preterm Abortions TAB SAB Ect Mult Living                 Review of Systems  Unable to perform ROS Constitutional: Negative for fever.  Respiratory: Positive for cough. Negative for shortness of breath.   Gastrointestinal: Negative for vomiting and diarrhea.  Skin: Negative for rash and wound.      Allergies  Codeine  Home Medications   Current Outpatient Rx  Name  Route  Sig  Dispense  Refill  . ketoconazole (NIZORAL) 2 % cream   Topical   Apply 1 application topically daily.         Marland Kitchen LISINOPRIL-HYDROCHLOROTHIAZIDE PO   Oral   Take 5 mLs by mouth daily. Patient taking Lisinopril 20/12.5/70ml daily         . pantoprazole sodium (PROTONIX) 40 mg/20 mL PACK   Oral   Take 20 mg by mouth 2 (two) times daily.         Marland Kitchen PRIMSOL 50 MG/5ML SOLN   Oral   Take 10 mLs by mouth at bedtime.          BP 133/88  Pulse 84  Temp(Src) 98.8 F (37.1 C) (Oral)  Resp 20  Ht 5' (1.524 m)  Wt 112 lb (50.803 kg)  BMI 21.87 kg/m2  SpO2 99% Physical Exam  Nursing note and vitals reviewed. Constitutional: She appears well-developed and well-nourished.  HENT:  Head: Normocephalic and atraumatic.  Eyes: Conjunctivae are normal.  Neck: Normal range of motion. Neck supple.  Cardiovascular: Normal rate, regular rhythm, normal heart sounds and intact distal pulses.   Pulmonary/Chest: Effort normal and breath sounds normal. No respiratory distress. She has no decreased breath sounds. She has no wheezes. She has no rhonchi. She exhibits no tenderness.  Poor effort with exam   Abdominal: Soft. Bowel sounds are normal. She exhibits no distension and no mass. There is no tenderness. There is no guarding.  Musculoskeletal: Normal range of motion. She exhibits no edema and no tenderness.  Lymphadenopathy:    She has no cervical adenopathy.  Neurological: She is alert.  Normal demeanor per family.  Pleasant, laughs,  Cannot answer questions meaningfully.  Skin: Skin  is warm and dry.    ED Course  Procedures (including critical care time) Labs Review Labs Reviewed - No data to display Imaging Review Dg Chest 2 View  12/11/2013   CLINICAL DATA:  Cough, chest congestion and chest pain.  EXAM: CHEST  2 VIEW  COMPARISON:  10/06/2006.  FINDINGS: Stable normal sized heart. The lungs remain clear. Stable surgical staples in the left lower lung. A moderate-sized hiatal hernia is unchanged. Diffuse osteopenia. Thoracolumbar vertebral compression deformities with anterior degenerative changes, compatible with old fractures. These have not changed significantly since 10/06/2006.  IMPRESSION: 1. No acute abnormality. 2. Stable moderate-sized hiatal hernia. 3. Old thoracolumbar vertebral compression fractures.   Electronically Signed   By: Gordan PaymentSteve  Reid M.D.   On: 12/11/2013 21:49   Dg Abd 2 Views  12/11/2013   CLINICAL DATA:  Abdominal pain, weakness  EXAM: ABDOMEN - 2 VIEW  COMPARISON:  None.  FINDINGS: Nonobstructive bowel gas pattern.  No evidence of free air under the diaphragm on the upright view.  Postsurgical changes overlying the left lower lung.  Degenerative changes of the lumbar spine.  IMPRESSION: No evidence of small bowel obstruction or free air.   Electronically Signed   By: Charline BillsSriyesh  Krishnan M.D.   On: 12/11/2013 21:54     EKG Interpretation None      MDM   Final diagnoses:  Cough    Patients labs and/or radiological studies were viewed and considered during the medical decision making and disposition process. Pt was also seen by Dr. Adriana Simasook during this visit.  Pt stable for dc.  Offered guaifenesin for cough, daughter deferred.      Burgess AmorJulie Jillann Charette, PA-C 12/11/13 2340

## 2013-12-11 NOTE — ED Notes (Signed)
Pt. Caregiver states "I need to leave my heart is in A-fib and you all are not going to do anything for her tonight." Asked pt. To stay and assured her that we would take care of the pt. And that the pt. Was stable to be discharged and that we would provide her with discharge paperwork and rx. Explained to pt caregiver that her paperwork was not up yet. EDP spoke with caregiver and explained to her that it was a virus. Pt. Caregiver requested that her husband come back in the morning to pick up rx for medication discussed with EDP. Spoke with charge nurse and she stated that it would be ok for pt. Caregiver to return in the morning to pick up rx. Pt. Left with caregiver at 2322.

## 2013-12-11 NOTE — ED Notes (Signed)
Pts caretaker came in with a cough earlier this week. Pt began coughing yesterday, worse in the evening.

## 2013-12-12 MED ORDER — GUAIFENESIN-DM 100-10 MG/5ML PO SYRP: 5.0000 mL | ORAL_SOLUTION | ORAL | Status: DC | PRN

## 2013-12-12 NOTE — ED Provider Notes (Signed)
Level V caveat for dementia. Patient is in no respiratory distress. Chest x-ray negative.  Discussed with caregiver  Donnetta HutchingBrian Destanee Bedonie, MD 12/12/13 [phone]

## 2013-12-12 NOTE — Discharge Instructions (Signed)
Cough, Adult  A cough is a reflex. It helps you clear your throat and airways. A cough can help heal your body. A cough can last 2 or 3 weeks (acute) or may last more than 8 weeks (chronic). Some common causes of a cough can include an infection, allergy, or a cold. HOME CARE  Only take medicine as told by your doctor.  If given, take your medicines (antibiotics) as told. Finish them even if you start to feel better.  Use a cold steam vaporizer or humidier in your home. This can help loosen thick spit (secretions).  Rest as needed.  Stop smoking if you smoke. GET HELP RIGHT AWAY IF:  You have yellowish-white fluid (pus) in your thick spit.  Your cough gets worse.  Your medicine does not reduce coughing, and you are losing sleep.  You cough up blood.  You have trouble breathing.  Your pain gets worse and medicine does not help.  You have a fever. MAKE SURE YOU:   Understand these instructions.  Will watch your condition.  Will get help right away if you are not doing well or get worse. Document Released: 05/16/2011 Document Revised: 11/25/2011 Document Reviewed: 05/16/2011 [facility] Patient Information 2014 [place], [place].

## 2013-12-13 DIAGNOSIS — J209 Acute bronchitis, unspecified: Secondary | ICD-10-CM | POA: Diagnosis not present

## 2013-12-18 ENCOUNTER — Encounter (HOSPITAL_COMMUNITY): Payer: Self-pay | Admitting: Emergency Medicine

## 2013-12-18 ENCOUNTER — Inpatient Hospital Stay (HOSPITAL_COMMUNITY)
Admission: EM | Admit: 2013-12-18 | Discharge: 2013-12-25 | DRG: 641 | Disposition: A | Discharge status: Home Health | Payer: Medicare Other | Attending: Family Medicine | Admitting: Family Medicine

## 2013-12-18 ENCOUNTER — Emergency Department (HOSPITAL_COMMUNITY): Payer: Medicare Other

## 2013-12-18 DIAGNOSIS — K521 Toxic gastroenteritis and colitis: Secondary | ICD-10-CM | POA: Diagnosis present

## 2013-12-18 DIAGNOSIS — E8770 Fluid overload, unspecified: Secondary | ICD-10-CM | POA: Diagnosis present

## 2013-12-18 DIAGNOSIS — F209 Schizophrenia, unspecified: Secondary | ICD-10-CM | POA: Diagnosis present

## 2013-12-18 DIAGNOSIS — E871 Hypo-osmolality and hyponatremia: Principal | ICD-10-CM | POA: Diagnosis present

## 2013-12-18 DIAGNOSIS — R197 Diarrhea, unspecified: Secondary | ICD-10-CM | POA: Diagnosis not present

## 2013-12-18 DIAGNOSIS — R059 Cough, unspecified: Secondary | ICD-10-CM

## 2013-12-18 DIAGNOSIS — G8929 Other chronic pain: Secondary | ICD-10-CM | POA: Diagnosis present

## 2013-12-18 DIAGNOSIS — I10 Essential (primary) hypertension: Secondary | ICD-10-CM | POA: Diagnosis present

## 2013-12-18 DIAGNOSIS — G309 Alzheimer's disease, unspecified: Secondary | ICD-10-CM | POA: Diagnosis present

## 2013-12-18 DIAGNOSIS — R05 Cough: Secondary | ICD-10-CM | POA: Diagnosis not present

## 2013-12-18 DIAGNOSIS — F0390 Unspecified dementia without behavioral disturbance: Secondary | ICD-10-CM

## 2013-12-18 DIAGNOSIS — B349 Viral infection, unspecified: Secondary | ICD-10-CM | POA: Diagnosis present

## 2013-12-18 DIAGNOSIS — B9789 Other viral agents as the cause of diseases classified elsewhere: Secondary | ICD-10-CM | POA: Diagnosis present

## 2013-12-18 DIAGNOSIS — K219 Gastro-esophageal reflux disease without esophagitis: Secondary | ICD-10-CM | POA: Diagnosis present

## 2013-12-18 DIAGNOSIS — F0280 Dementia in other diseases classified elsewhere without behavioral disturbance: Secondary | ICD-10-CM | POA: Diagnosis not present

## 2013-12-18 DIAGNOSIS — E869 Volume depletion, unspecified: Secondary | ICD-10-CM | POA: Diagnosis present

## 2013-12-18 DIAGNOSIS — J9819 Other pulmonary collapse: Secondary | ICD-10-CM | POA: Diagnosis not present

## 2013-12-18 DIAGNOSIS — R918 Other nonspecific abnormal finding of lung field: Secondary | ICD-10-CM | POA: Diagnosis not present

## 2013-12-18 DIAGNOSIS — N390 Urinary tract infection, site not specified: Secondary | ICD-10-CM | POA: Diagnosis not present

## 2013-12-18 HISTORY — DX: Enterocolitis due to Clostridium difficile, not specified as recurrent: A04.72

## 2013-12-18 HISTORY — DX: Diaphragmatic hernia without obstruction or gangrene: K44.9

## 2013-12-18 HISTORY — DX: Collapsed vertebra, not elsewhere classified, site unspecified, initial encounter for fracture: M48.50XA

## 2013-12-18 LAB — URINALYSIS, ROUTINE W REFLEX MICROSCOPIC
Bilirubin Urine: NEGATIVE
Glucose, UA: NEGATIVE mg/dL
Ketones, ur: NEGATIVE mg/dL
NITRITE: NEGATIVE
PH: 6.5 (ref 5.0–8.0)
PROTEIN: NEGATIVE mg/dL
Specific Gravity, Urine: <1.005 — ABNORMAL LOW (ref 1.005–1.030)
Urobilinogen, UA: 0.2 mg/dL (ref 0.0–1.0)

## 2013-12-18 LAB — COMPREHENSIVE METABOLIC PANEL
ALBUMIN: 3.2 g/dL — AB (ref 3.5–5.2)
ALT: 21 U/L (ref 0–35)
AST: 56 U/L — AB (ref 0–37)
Alkaline Phosphatase: 81 U/L (ref 39–117)
BILIRUBIN TOTAL: 0.5 mg/dL (ref 0.3–1.2)
BUN: 8 mg/dL (ref 6–23)
CHLORIDE: 90 meq/L — AB (ref 96–112)
CO2: 26 mEq/L (ref 19–32)
Calcium: 9.3 mg/dL (ref 8.4–10.5)
Creatinine, Ser: 0.82 mg/dL (ref 0.50–1.10)
GFR calc Af Amer: 72 mL/min — ABNORMAL LOW (ref >90)
GFR calc non Af Amer: 63 mL/min — ABNORMAL LOW (ref >90)
Glucose, Bld: 121 mg/dL — ABNORMAL HIGH (ref 70–99)
POTASSIUM: 4.5 meq/L (ref 3.7–5.3)
SODIUM: 126 meq/L — AB (ref 137–147)
Total Protein: 6.6 g/dL (ref 6.0–8.3)

## 2013-12-18 LAB — CBC WITH DIFFERENTIAL/PLATELET
BASOS ABS: 0 10*3/uL (ref 0.0–0.1)
Basophils Relative: 0 % (ref 0–1)
Eosinophils Absolute: 0.1 10*3/uL (ref 0.0–0.7)
Eosinophils Relative: 1 % (ref 0–5)
HCT: 37.7 % (ref 36.0–46.0)
HEMOGLOBIN: 12.7 g/dL (ref 12.0–15.0)
Lymphocytes Relative: 29 % (ref 12–46)
Lymphs Abs: 2 10*3/uL (ref 0.7–4.0)
MCH: 29.4 pg (ref 26.0–34.0)
MCHC: 33.7 g/dL (ref 30.0–36.0)
MCV: 87.3 fL (ref 78.0–100.0)
Monocytes Absolute: 0.5 10*3/uL (ref 0.1–1.0)
Monocytes Relative: 8 % (ref 3–12)
NEUTROS ABS: 4.2 10*3/uL (ref 1.7–7.7)
NEUTROS PCT: 62 % (ref 43–77)
Platelets: 195 10*3/uL (ref 150–400)
RBC: 4.32 MIL/uL (ref 3.87–5.11)
RDW: 12.4 % (ref 11.5–15.5)
WBC: 6.8 10*3/uL (ref 4.0–10.5)

## 2013-12-18 LAB — LIPASE, BLOOD: Lipase: 83 U/L — ABNORMAL HIGH (ref 11–59)

## 2013-12-18 LAB — TROPONIN I: Troponin I: <0.3 ng/mL (ref <0.30)

## 2013-12-18 LAB — LACTIC ACID, PLASMA: Lactic Acid, Venous: 1.9 mmol/L (ref 0.5–2.2)

## 2013-12-18 LAB — URINE MICROSCOPIC-ADD ON

## 2013-12-18 LAB — PRO B NATRIURETIC PEPTIDE: Pro B Natriuretic peptide (BNP): 202 pg/mL (ref 0–450)

## 2013-12-18 LAB — POC OCCULT BLOOD, ED: FECAL OCCULT BLD: NEGATIVE

## 2013-12-18 MED ORDER — SODIUM CHLORIDE 0.9 % IV SOLN
INTRAVENOUS | Status: DC
  Administered 2013-12-18 – 2013-12-23 (×6): via INTRAVENOUS

## 2013-12-18 MED ORDER — PANTOPRAZOLE SODIUM 40 MG PO PACK
20.0000 mg | PACK | Freq: Two times a day (BID) | ORAL | Status: DC
  Administered 2013-12-20 – 2013-12-24 (×7): 20 mg via ORAL
  Filled 2013-12-18 (×18): qty 20

## 2013-12-18 MED ORDER — ONDANSETRON HCL 4 MG/2ML IJ SOLN: 4.0000 mg | Freq: Four times a day (QID) | INTRAMUSCULAR | Status: DC | PRN

## 2013-12-18 MED ORDER — SODIUM CHLORIDE 0.9 % IV SOLN
INTRAVENOUS | Status: AC
Start: 2013-12-18 — End: 2013-12-19
  Administered 2013-12-18: via INTRAVENOUS

## 2013-12-18 MED ORDER — DEXTROSE 5 % IV SOLN
1.0000 g | Freq: Once | INTRAVENOUS | Status: AC
  Administered 2013-12-18: 1 g via INTRAVENOUS
  Filled 2013-12-18: qty 10

## 2013-12-18 MED ORDER — DEXTROSE 5 % IV SOLN
1.0000 g | INTRAVENOUS | Status: DC
  Administered 2013-12-19 – 2013-12-22 (×4): 1 g via INTRAVENOUS
  Filled 2013-12-18 (×4): qty 10

## 2013-12-18 MED ORDER — ONDANSETRON HCL 4 MG PO TABS: 4.0000 mg | ORAL_TABLET | Freq: Four times a day (QID) | ORAL | Status: DC | PRN

## 2013-12-18 MED ORDER — HEPARIN SODIUM (PORCINE) 5000 UNIT/ML IJ SOLN
5000.0000 [IU] | Freq: Three times a day (TID) | INTRAMUSCULAR | Status: DC
  Administered 2013-12-19 – 2013-12-25 (×15): 5000 [IU] via SUBCUTANEOUS
  Filled 2013-12-18 (×18): qty 1

## 2013-12-18 NOTE — ED Notes (Signed)
Report called to Ladona Ridgelaylor, RN on unit 300.

## 2013-12-18 NOTE — ED Provider Notes (Signed)
CSN: [id number]     Arrival date & time 12/18/13  1844 History   First MD Initiated Contact with Patient 12/18/13 1902     Chief Complaint  Patient presents with  . Diarrhea  . Cough      Patient is a 80 y.o. female presenting with diarrhea and cough. The history is provided by a caregiver and a relative. The history is limited by the condition of the patient (Hx dementia).  Diarrhea Cough Pt was seen at 1905. Per pt's family, c/o pt with gradual onset and persistence of constant cough x1 week. Has been associated with multiple intermittent episodes of "loose stools." Pt's family states pt "clutches her chest when she coughs." Pt was evaluated in the ED 1 week ago for these symptoms, "dx pneumonia and rx antibiotics." Pt's caregiver states "the antibiotics need to be changed to something stronger because she's still coughing and getting worse." Denies N/V, no black or blood in stools, no fevers, no SOB/wheezing.     Past Medical History  Diagnosis Date  . Hypertension   . Reflux   . Schizophrenia   . Alzheimer disease   . Chronic back pain   . Clostridium difficile colitis   . Hiatal hernia   . Vertebral compression fracture    Past Surgical History  Procedure Laterality Date  . Carotid stent    . Tumor of lung -benign , and removed.    . Carotid endarterectomy    . Lung removal, partial      History  Substance Use Topics  . Smoking status: Never Smoker   . Smokeless tobacco: Not on file  . Alcohol Use: No    Review of Systems  Unable to perform ROS: Dementia  Respiratory: Positive for cough.   Gastrointestinal: Positive for diarrhea.      Allergies  Codeine  Home Medications   Current Outpatient Rx  Name  Route  Sig  Dispense  Refill  . guaiFENesin-dextromethorphan (ROBITUSSIN DM) 100-10 MG/5ML syrup   Oral   Take 5 mLs by mouth every 4 (four) hours as needed for cough.   118 mL   0   . ketoconazole (NIZORAL) 2 % cream   Topical   Apply 1 application  topically daily.         Marland Kitchen LISINOPRIL-HYDROCHLOROTHIAZIDE PO   Oral   Take 5 mLs by mouth daily. Patient taking Lisinopril 20/12.5/7ml daily         . pantoprazole sodium (PROTONIX) 40 mg/20 mL PACK   Oral   Take 20 mg by mouth 2 (two) times daily.         Marland Kitchen PRIMSOL 50 MG/5ML SOLN   Oral   Take 10 mLs by mouth at bedtime.          BP 166/67  Pulse 77  Temp(Src) 98.5 F (36.9 C) (Oral)  Resp 18  Ht 5\' 2"  (1.575 m)  Wt 110 lb (49.896 kg)  BMI 20.11 kg/m2  SpO2 97% Physical Exam 1910: Physical examination:  Nursing notes reviewed; Vital signs and O2 SAT reviewed;  Constitutional: Thin, frail, In no acute distress; Head:  Normocephalic, atraumatic; Eyes: EOMI, PERRL, No scleral icterus; ENMT: Mouth and pharynx normal, Mucous membranes dry; Neck: Supple, Full range of motion, No lymphadenopathy; Cardiovascular: Regular rate and rhythm, No gallop; Respiratory: Breath sounds coarse & equal bilaterally, No wheezes. No audible wheezing. +moist cough during exam. Speaking in few words per baseline. Normal respiratory effort/excursion; Chest: Nontender, Movement normal; Abdomen: Soft, Nontender, Nondistended,  Normal bowel sounds; Genitourinary: No CVA tenderness; Extremities: Pulses normal, No tenderness, +tr pedal edema bilat without calf edema or asymmetry.; Neuro: Awake, alert, confused re: time, place, events per hx dementia. Major CN grossly intact. No facial droop. Speech clear. Moves all extremities spontaneously on stretcher without apparent gross focal motor deficits.; Skin: Color normal, Warm, Dry.   ED Course  Procedures   1915:  EPIC chart reviewed: pt was evaluated in the ED on 12/11/13, CXR negative for infiltrate, rx robitussin. Workup in progress.   2100:  Pt has new hyponatremia compared to previous labs. +UTI, UC pending. Will dose IV rocephin. Cdiff and GI pathogen panel pending. Pt is not orthostatic on VS (only stands long enough to pivot per her baseline). T/C to  [facility]. Conley Rolls, case discussed, including:  HPI, pertinent PM/SHx, VS/PE, dx testing, ED course and treatment:  Agreeable to admit, requests to write temporary orders, obtain tele bed.     EKG Interpretation   Date/Time:  Saturday December 18 2013 19:24:49 EDT Ventricular Rate:  73 PR Interval:  224 QRS Duration: 104 QT Interval:  384 QTC Calculation: 423 R Axis:   20 Text Interpretation:  Sinus rhythm with 1st degree A-V block with  Premature atrial complexes Otherwise normal ECG No previous ECGs available  Confirmed by [facility]  MD, Nicholos Johns [id number]) on 12/18/2013 7:31:23 PM      MDM  MDM Reviewed: nursing note, previous chart and vitals Reviewed previous: labs, ECG and x-ray Interpretation: labs, ECG and x-ray Total time providing critical care: 30-74 minutes. This excludes time spent performing separately reportable procedures and services. Consults: admitting MD   CRITICAL CARE Performed by: Laray Anger Total critical care time: 35 Critical care time was exclusive of separately billable procedures and treating other patients. Critical care was necessary to treat or prevent imminent or life-threatening deterioration. Critical care was time spent personally by me on the following activities: development of treatment plan with patient and/or surrogate as well as nursing, discussions with consultants, evaluation of patient's response to treatment, examination of patient, obtaining history from patient or surrogate, ordering and performing treatments and interventions, ordering and review of laboratory studies, ordering and review of radiographic studies, pulse oximetry and re-evaluation of patient's condition.   Results for orders placed during the hospital encounter of 12/18/13  URINALYSIS, ROUTINE W REFLEX MICROSCOPIC      Result Value Ref Range   Color, Urine YELLOW  YELLOW   APPearance CLEAR  CLEAR   Specific Gravity, Urine <1.005 (*) 1.005 - 1.030   pH 6.5  5.0 - 8.0    Glucose, UA NEGATIVE  NEGATIVE mg/dL   Hgb urine dipstick SMALL (*) NEGATIVE   Bilirubin Urine NEGATIVE  NEGATIVE   Ketones, ur NEGATIVE  NEGATIVE mg/dL   Protein, ur NEGATIVE  NEGATIVE mg/dL   Urobilinogen, UA 0.2  0.0 - 1.0 mg/dL   Nitrite NEGATIVE  NEGATIVE   Leukocytes, UA LARGE (*) NEGATIVE  CBC WITH DIFFERENTIAL      Result Value Ref Range   WBC 6.8  4.0 - 10.5 K/uL   RBC 4.32  3.87 - 5.11 MIL/uL   Hemoglobin 12.7  12.0 - 15.0 g/dL   HCT 52.8  41.3 - 24.4 %   MCV 87.3  78.0 - 100.0 fL   MCH 29.4  26.0 - 34.0 pg   MCHC 33.7  30.0 - 36.0 g/dL   RDW 01.0  27.2 - 53.6 %   Platelets 195  150 - 400 K/uL  Neutrophils Relative % 62  43 - 77 %   Neutro Abs 4.2  1.7 - 7.7 K/uL   Lymphocytes Relative 29  12 - 46 %   Lymphs Abs 2.0  0.7 - 4.0 K/uL   Monocytes Relative 8  3 - 12 %   Monocytes Absolute 0.5  0.1 - 1.0 K/uL   Eosinophils Relative 1  0 - 5 %   Eosinophils Absolute 0.1  0.0 - 0.7 K/uL   Basophils Relative 0  0 - 1 %   Basophils Absolute 0.0  0.0 - 0.1 K/uL  COMPREHENSIVE METABOLIC PANEL      Result Value Ref Range   Sodium 126 (*) 137 - 147 mEq/L   Potassium 4.5  3.7 - 5.3 mEq/L   Chloride 90 (*) 96 - 112 mEq/L   CO2 26  19 - 32 mEq/L   Glucose, Bld 121 (*) 70 - 99 mg/dL   BUN 8  6 - 23 mg/dL   Creatinine, Ser 1.610.82  0.50 - 1.10 mg/dL   Calcium 9.3  8.4 - 09.610.5 mg/dL   Total Protein 6.6  6.0 - 8.3 g/dL   Albumin 3.2 (*) 3.5 - 5.2 g/dL   AST 56 (*) 0 - 37 U/L   ALT 21  0 - 35 U/L   Alkaline Phosphatase 81  39 - 117 U/L   Total Bilirubin 0.5  0.3 - 1.2 mg/dL   GFR calc non Af Amer 63 (*) >90 mL/min   GFR calc Af Amer 72 (*) >90 mL/min  LIPASE, BLOOD      Result Value Ref Range   Lipase 83 (*) 11 - 59 U/L  LACTIC ACID, PLASMA      Result Value Ref Range   Lactic Acid, Venous 1.9  0.5 - 2.2 mmol/L  TROPONIN I      Result Value Ref Range   Troponin I <0.30  <0.30 ng/mL  PRO B NATRIURETIC PEPTIDE      Result Value Ref Range   Pro B Natriuretic peptide (BNP)  202.0  0 - 450 pg/mL  URINE MICROSCOPIC-ADD ON      Result Value Ref Range   WBC, UA TOO NUMEROUS TO COUNT  <3 WBC/hpf   RBC / HPF 3-6  <3 RBC/hpf   Bacteria, UA MANY (*) RARE  POC OCCULT BLOOD, ED      Result Value Ref Range   Fecal Occult Bld NEGATIVE  NEGATIVE   Dg Chest 2 View 12/18/2013   CLINICAL DATA:  Cough.  EXAM: CHEST  2 VIEW  COMPARISON:  12/11/2013.  FINDINGS: The cardiac silhouette, mediastinal and hilar contours are within normal limits and stable. There is tortuosity, ectasia and calcification of the thoracic aorta. A moderate size hiatal hernia the lungs are clear. No pleural effusion or pneumothorax. The bony thorax is intact. There are remote lower thoracic and upper lumbar compression fractures.  IMPRESSION: No acute cardiopulmonary findings.   Electronically Signed   By: Loralie ChampagneMark  Gallerani M.D.   On: 12/18/2013 19:37      Laray AngerKathleen M Yossef Gilkison, DO 12/20/13 1347

## 2013-12-18 NOTE — ED Notes (Signed)
Patient unable to stand for orthostatic vitals.

## 2013-12-18 NOTE — ED Notes (Signed)
Patient incontinent of large volume urine.  Cleaned and changed linens.

## 2013-12-18 NOTE — ED Notes (Signed)
COUGH, CONGESTION, DIARRHEA, AND HER CHEST HURTS WHEN SHE COUGHS.

## 2013-12-18 NOTE — H&P (Signed)
[facility] History and Physical  Deborah Barrelma C Clowers EAV:[id number] DOB: 09/16/1923    PCP:   Isabella StallingNDIEGO,RICHARD M, MD   Chief Complaint: brought in by daughter for persistent coughs.  HPI: [name] is an 80 y.o. female lives at home with her daughter as a primary care taker, hx of advanced dementia, schizophrenia, HTN (on ACE-I, diuretic combination pill), prior C diff, returned to the ER with persistent coughing and having diarrhea.  She was seen about a week ago, and was given Zithromax liquid antibiotics.  Daughter said she started to have watery diarrhea since taking the medication.  She was doing well according to her daughter until about a month ago, when she was exposed to a helper with a "cough".  She has been coughing since then.  Daughter said at times, she coughs with eating and with drinking.  She has no fever.  With her dementia, she was not able to voice any complaints.  Evalatuion in the ER included a CXR which showed no acute process. Her WBC was normal with Hb of 12. 7 grams per dL, Na of 782126 mEq per L, and normal renal fx tests. Her UA showed TNTC WBCs and the presence of bacteria.  Hospitalist was asked to admit her for UTI, volume depletion with hyponatremia, and diarrhea.  Rewiew of Systems:  I was not able to obtain a meaningful ROS.  Past Medical History  Diagnosis Date  . Hypertension   . Reflux   . Schizophrenia   . Alzheimer disease   . Chronic back pain   . Clostridium difficile colitis   . Hiatal hernia   . Vertebral compression fracture     Past Surgical History  Procedure Laterality Date  . Carotid stent    . Tumor of lung -benign , and removed.    . Carotid endarterectomy    . Lung removal, partial      Medications:  HOME MEDS: Prior to Admission medications   Medication Sig Start Date End Date Taking? Authorizing Provider  guaiFENesin-dextromethorphan (ROBITUSSIN DM) 100-10 MG/5ML syrup Take 5 mLs by mouth every 4 (four) hours as needed for  cough. 12/12/13  Yes Raynelle FanningJulie Idol, PA-C  ketoconazole (NIZORAL) 2 % cream Apply 1 application topically daily.   Yes Historical Provider, MD  LISINOPRIL-HYDROCHLOROTHIAZIDE PO Take 5 mLs by mouth daily. Patient taking Lisinopril 20/12.5/765ml daily   Yes Historical Provider, MD  pantoprazole sodium (PROTONIX) 40 mg/20 mL PACK Take 20 mg by mouth 2 (two) times daily.   Yes Historical Provider, MD  PRIMSOL 50 MG/5ML SOLN Take 10 mLs by mouth at bedtime. 12/04/13  Yes Historical Provider, MD     Allergies:  Allergies  Allergen Reactions  . Codeine Other (See Comments)    UNKNOWN REACTION    Social History:   reports that she has never smoked. She does not have any smokeless tobacco history on file. She reports that she does not drink alcohol or use illicit drugs.  Family History: History reviewed. No pertinent family history.   Physical Exam: Filed Vitals:   12/18/13 1856 12/18/13 2046 12/18/13 2047 12/18/13 2053  BP: 134/75 125/82 126/73 166/67  Pulse: 78 72 76 77  Temp: 98.1 F (36.7 C)   98.5 F (36.9 C)  TempSrc: Oral   Oral  Resp: 18   18  Height: 5\' 2"  (1.575 m)     Weight: 49.896 kg (110 lb)     SpO2: 100%   97%   Blood pressure 166/67, pulse  77, temperature 98.5 F (36.9 C), temperature source Oral, resp. rate 18, height 5\' 2"  (1.575 m), weight 49.896 kg (110 lb), SpO2 97.00%.  GEN:  Pleasant patient lying in the stretcher in no acute distress; cooperative with exam. She has a wet cough. PSYCH:  alert and oriented x4; does not appear anxious or depressed; affect is appropriate. HEENT: Mucous membranes pink and anicteric; PERRLA; EOM intact; no cervical lymphadenopathy nor thyromegaly or carotid bruit; no JVD; There were no stridor. Neck is very supple. Breasts:: Not examined CHEST WALL: No tenderness CHEST: Normal respiration, No wheezing, but crackles everywhere. HEART: Regular rate and rhythm.  There are no murmur, rub, or gallops.   BACK: No kyphosis or scoliosis; no  CVA tenderness ABDOMEN: soft and non-tender; no masses, no organomegaly, normal abdominal bowel sounds; no pannus; no intertriginous candida. There is no rebound and no distention. Rectal Exam: Not done EXTREMITIES: No bone or joint deformity; age-appropriate arthropathy of the hands and knees; no edema; no ulcerations.  There is no calf tenderness. Genitalia: not examined PULSES: 2+ and symmetric SKIN: Normal hydration no rash or ulceration CNS: Cranial nerves 2-12 grossly intact no focal lateralizing neurologic deficit.  Speech is fluent; uvula elevated with phonation, facial symmetry and tongue midline. DTR are normal bilaterally, cerebella exam is intact, barbinski is negative and strengths are equaled bilaterally.  No sensory loss.   Labs on Admission:  Basic Metabolic Panel:  Recent Labs Lab 12/18/13 1952  NA 126*  K 4.5  CL 90*  CO2 26  GLUCOSE 121*  BUN 8  CREATININE 0.82  CALCIUM 9.3   Liver Function Tests:  Recent Labs Lab 12/18/13 1952  AST 56*  ALT 21  ALKPHOS 81  BILITOT 0.5  PROT 6.6  ALBUMIN 3.2*    Recent Labs Lab 12/18/13 1952  LIPASE 83*   No results found for this basename: AMMONIA,  in the last 168 hours CBC:  Recent Labs Lab 12/18/13 1952  WBC 6.8  NEUTROABS 4.2  HGB 12.7  HCT 37.7  MCV 87.3  PLT 195   Cardiac Enzymes:  Recent Labs Lab 12/18/13 1952  TROPONINI <0.30    CBG: No results found for this basename: GLUCAP,  in the last 168 hours   Radiological Exams on Admission: Dg Chest 2 View  12/18/2013   CLINICAL DATA:  Cough.  EXAM: CHEST  2 VIEW  COMPARISON:  12/11/2013.  FINDINGS: The cardiac silhouette, mediastinal and hilar contours are within normal limits and stable. There is tortuosity, ectasia and calcification of the thoracic aorta. A moderate size hiatal hernia the lungs are clear. No pleural effusion or pneumothorax. The bony thorax is intact. There are remote lower thoracic and upper lumbar compression fractures.   IMPRESSION: No acute cardiopulmonary findings.   Electronically Signed   By: Loralie Champagne M.D.   On: 12/18/2013 19:37   Assessment/Plan Present on Admission:  . Hyponatremia . UTI (urinary tract infection) . Viral syndrome . Diarrhea due to drug . Volume depletion  PLAN:  This patient likely has a viral syndrome, with frequent coughs.  Her diarrhea, I suspect, was from the antibiotic Zithromax.  She is having hypovolemic hyponatremia, from decrease oral intake, aggravated with both loose stool, and diuretic for her HTN.  She does have a UTI.  Will admit her for IV hydration, holding her diuretics.  She will be treated with IV Rocephin for her UTI.  She does have a frequent cough, and with hx of coughing with eating and drinking,  I will obtain a swallowing evalautuion thru speech therapist.  For her Na, will give NS and follow up with level.  She is stable, full code (reconfirmed tonight), and will be admitted to [facility] hospitalist service. Thank you for allowing me to particiapte in her care.  Other plans as per orders.  Code Status: FULL Unk Lightning, MD. [facility] Pager [phone] 7pm to 7am.  12/18/2013, 10:02 PM

## 2013-12-19 LAB — COMPREHENSIVE METABOLIC PANEL
ALT: 22 U/L (ref 0–35)
AST: 57 U/L — ABNORMAL HIGH (ref 0–37)
Albumin: 3.4 g/dL — ABNORMAL LOW (ref 3.5–5.2)
Alkaline Phosphatase: 85 U/L (ref 39–117)
BUN: 6 mg/dL (ref 6–23)
CALCIUM: 9.8 mg/dL (ref 8.4–10.5)
CO2: 31 mEq/L (ref 19–32)
Chloride: 99 mEq/L (ref 96–112)
Creatinine, Ser: 0.79 mg/dL (ref 0.50–1.10)
GFR calc non Af Amer: 73 mL/min — ABNORMAL LOW (ref >90)
GFR, EST AFRICAN AMERICAN: 84 mL/min — AB (ref >90)
Glucose, Bld: 106 mg/dL — ABNORMAL HIGH (ref 70–99)
Potassium: 4 mEq/L (ref 3.7–5.3)
SODIUM: 139 meq/L (ref 137–147)
TOTAL PROTEIN: 7 g/dL (ref 6.0–8.3)
Total Bilirubin: 0.6 mg/dL (ref 0.3–1.2)

## 2013-12-19 LAB — TROPONIN I

## 2013-12-19 NOTE — Progress Notes (Signed)
Notified by central telemetry that patient had a possible ST elevation. Assessed patient and patient was resting with no signs of distress. VS were within normal limits. MD was notified. MD ordered a troponin level in the AM. Will continue to monitor the patient.

## 2013-12-19 NOTE — Progress Notes (Signed)
NURSING PROGRESS NOTE  Deborah Barrelma C Polgar [id number] Admitted to 325: 12/18/13 Attending Provider: Dorothea OgleIskra M Myers, MD    [name] is a 80 y.o. female patient admitted from ED (received report from laurie,RN) awake, alert  & disorientated  X 4,  Full Code, VSS - Blood pressure 163/75, pulse 78, temperature 99.1 F (37.3 C), temperature source Oral, resp. rate 15, height 5\' 2"  (1.575 m), weight 53.071 kg (117 lb), SpO2 97.00%. No c/o shortness of breath, no c/o chest pain, no distress noted. Tele # [id number] placed.   IV site WDL: Right AC with a transparent dsg that's clean dry and intact.  Allergies:   Allergies  Allergen Reactions  . Codeine Other (See Comments)    UNKNOWN REACTION     Past Medical History  Diagnosis Date  . Hypertension   . Reflux   . Schizophrenia   . Alzheimer disease   . Chronic back pain   . Clostridium difficile colitis   . Hiatal hernia   . Vertebral compression fracture     History:  obtained from patients daughter, pt is has advance alzheimer's and does not follow commands.  Pt and daughter orientation to unit, room and routine. Admission INP armband ID verified with patient/family, and in place. SR up x 2, fall risk assessment complete with daughter verbalizing understanding of risks associated with falls. Pt's daughter verbalizes an understanding of how to use the call bell and to call for help before getting out of bed. Pt's bed alarm is on. Skin, clean-dry- intact without evidence of bruising, or skin tears.   No evidence of skin break down noted on exam.    Will cont to monitor and assist as needed.  Ubaldo GlassingJames, Tyri Elmore Morgan, RN 12/19/13

## 2013-12-19 NOTE — Progress Notes (Signed)
Subjective: Patient was admitted yesterday due to hyponatremia and UTI. She is doing much better today.  Objective: Vital signs in last 24 hours: Temp:  [98 F (36.7 C)-99.1 F (37.3 C)] 98 F (36.7 C) (04/05 16100608) Pulse Rate:  [72-90] 90 (04/05 0608) Resp:  [15-18] 16 (04/05 0608) BP: (125-166)/(67-82) 162/72 mmHg (04/05 0608) SpO2:  [96 %-100 %] 96 % (04/05 0608) Weight:  [49.896 kg (110 lb)-53.071 kg (117 lb)] 53.071 kg (117 lb) (04/04 2231) Weight change:  Last BM Date: 12/18/13  Intake/Output from previous day: 04/04 0701 - 04/05 0700 In: 437.5 [I.V.:437.5] Out: -   PHYSICAL EXAM General appearance: alert and no distress Resp: clear to auscultation bilaterally Cardio: S1, S2 normal GI: soft, non-tender; bowel sounds normal; no masses,  no organomegaly Extremities: extremities normal, atraumatic, no cyanosis or edema  Lab Results:    @labtest @ ABGS No results found for this basename: PHART, PCO2, PO2ART, TCO2, HCO3,  in the last 72 hours CULTURES No results found for this or any previous visit (from the past 240 hour(s)). Studies/Results: Dg Chest 2 View  12/18/2013   CLINICAL DATA:  Cough.  EXAM: CHEST  2 VIEW  COMPARISON:  12/11/2013.  FINDINGS: The cardiac silhouette, mediastinal and hilar contours are within normal limits and stable. There is tortuosity, ectasia and calcification of the thoracic aorta. A moderate size hiatal hernia the lungs are clear. No pleural effusion or pneumothorax. The bony thorax is intact. There are remote lower thoracic and upper lumbar compression fractures.  IMPRESSION: No acute cardiopulmonary findings.   Electronically Signed   By: Loralie ChampagneMark  Gallerani M.D.   On: 12/18/2013 19:37    Medications: I have reviewed the patient's current medications.  Assesment: Principal Problem:   Hyponatremia Active Problems:   UTI (urinary tract infection)   Viral syndrome   Diarrhea due to drug   Volume depletion    Plan: Medications  reviewed Continue IV hydration Will continue to monitor BMP continue supportive care.    LOS: 1 day   Victory Dresden 12/19/2013, 12:50 PM

## 2013-12-19 NOTE — Progress Notes (Signed)
Per physician and family request pt was seen for a bedside swallow screen.  Evaluated using water, jello and cracker.  Pt swallowed each item without coughing.  Daughter states patient does a puree diet with baby food consistency will start patient on this per Dr Letitia NeriFanta's verbal order.   Fanta wanted pt to be evaluated due to patients family request that pt be allowed to eat.

## 2013-12-20 LAB — URINE CULTURE
CULTURE: NO GROWTH
Colony Count: NO GROWTH

## 2013-12-20 NOTE — Progress Notes (Signed)
Utilization Review Complete  

## 2013-12-20 NOTE — Progress Notes (Signed)
[id number] 

## 2013-12-21 LAB — CBC
HEMATOCRIT: 34.3 % — AB (ref 36.0–46.0)
HEMOGLOBIN: 11.3 g/dL — AB (ref 12.0–15.0)
MCH: 29.7 pg (ref 26.0–34.0)
MCHC: 32.9 g/dL (ref 30.0–36.0)
MCV: 90 fL (ref 78.0–100.0)
Platelets: 188 10*3/uL (ref 150–400)
RBC: 3.81 MIL/uL — ABNORMAL LOW (ref 3.87–5.11)
RDW: 13 % (ref 11.5–15.5)
WBC: 4.9 10*3/uL (ref 4.0–10.5)

## 2013-12-21 LAB — CBC WITH DIFFERENTIAL/PLATELET
Basophils Absolute: 0 10*3/uL (ref 0.0–0.1)
Basophils Relative: 1 % (ref 0–1)
Eosinophils Absolute: 0.2 10*3/uL (ref 0.0–0.7)
Eosinophils Relative: 3 % (ref 0–5)
HCT: 33.8 % — ABNORMAL LOW (ref 36.0–46.0)
HEMOGLOBIN: 11 g/dL — AB (ref 12.0–15.0)
LYMPHS ABS: 2.4 10*3/uL (ref 0.7–4.0)
Lymphocytes Relative: 40 % (ref 12–46)
MCH: 29.3 pg (ref 26.0–34.0)
MCHC: 32.5 g/dL (ref 30.0–36.0)
MCV: 89.9 fL (ref 78.0–100.0)
MONOS PCT: 7 % (ref 3–12)
Monocytes Absolute: 0.4 10*3/uL (ref 0.1–1.0)
NEUTROS PCT: 49 % (ref 43–77)
Neutro Abs: 2.9 10*3/uL (ref 1.7–7.7)
PLATELETS: 150 10*3/uL (ref 150–400)
RBC: 3.76 MIL/uL — AB (ref 3.87–5.11)
RDW: 12.9 % (ref 11.5–15.5)
WBC: 6 10*3/uL (ref 4.0–10.5)

## 2013-12-21 LAB — BASIC METABOLIC PANEL
BUN: 14 mg/dL (ref 6–23)
CO2: 27 mEq/L (ref 19–32)
Calcium: 8.8 mg/dL (ref 8.4–10.5)
Chloride: 106 mEq/L (ref 96–112)
Creatinine, Ser: 0.72 mg/dL (ref 0.50–1.10)
GFR, EST AFRICAN AMERICAN: 87 mL/min — AB (ref >90)
GFR, EST NON AFRICAN AMERICAN: 75 mL/min — AB (ref >90)
Glucose, Bld: 101 mg/dL — ABNORMAL HIGH (ref 70–99)
Potassium: 4.2 mEq/L (ref 3.7–5.3)
SODIUM: 142 meq/L (ref 137–147)

## 2013-12-21 LAB — SEDIMENTATION RATE: SED RATE: 12 mm/h (ref 0–22)

## 2013-12-21 LAB — TSH: TSH: 1.11 u[IU]/mL (ref 0.350–4.500)

## 2013-12-21 MED ORDER — GUAIFENESIN-DM 100-10 MG/5ML PO SYRP
5.0000 mL | ORAL_SOLUTION | Freq: Three times a day (TID) | ORAL | Status: DC | PRN
  Administered 2013-12-21 – 2013-12-22 (×2): 5 mL via ORAL
  Filled 2013-12-21 (×4): qty 5

## 2013-12-21 MED ORDER — PANTOPRAZOLE SODIUM 40 MG PO TBEC
40.0000 mg | DELAYED_RELEASE_TABLET | Freq: Every day | ORAL | Status: DC
  Filled 2013-12-21: qty 1

## 2013-12-21 NOTE — Progress Notes (Signed)
[id number] 

## 2013-12-21 NOTE — Progress Notes (Signed)
12/21/13 1914 Patient noted to have possible rhythm change on telemetry monitoring. Previously afib per nurse report, appears to be in second degree heart block at this time. Notified Dr. Juanetta GoslingHawkins, on-call for Dr. Janna ArchonDiego. Order received for stat EKG to verify rhythm. Notified RT of order for EKG. Devin GoingA. Adelina Collard, RN

## 2013-12-21 NOTE — Progress Notes (Signed)
Patient's daughter [name] called to check on patient. [name] is concerned patient's has some issues with her lungs and request a pulmonologist to evaluate. [name] also requested cough syrup for patient. [facility] x[facility] was negative and the doctor's note state no evidence of upper respiratory infection Upon assessment of patients, lungs clear and diminished, no cough noted, no labored breathing, patient is not in acute distress. Dr. Janna Archondiego notified. New orders for Robitussin DM q8hrs as needed.

## 2013-12-21 NOTE — Progress Notes (Signed)
Patient right AC iv infiltrated. Area surrounding iv site is edematous. Iv discontinued. New iv to be restarted. Will continue to monitor.

## 2013-12-21 NOTE — Progress Notes (Signed)
NAMLendell Caprice:  Nickolas, Ruqayyah                  ACCOUNT NO.:  [id number]  MEDICAL RECORD NO.:  [id number]  LOCATION:  A325                          FACILITY:  [facility]  PHYSICIAN:  Melvyn Novasichard Michael Niyah Mamaril, MDDATE OF BIRTH:  06-Oct-1923  DATE OF PROCEDURE: DATE OF DISCHARGE:                                PROGRESS NOTE   This is an 80 year old white female with advanced dementia, at home with daughter who seems overwhelmed, came in with hyponatremia along with significant UTI.  WBCs TNTC.  She was on ACE inhibitor with diuretic. Sodium has improved from 126 to 139, currently on Rocephin for a period of 48 hours for resolving UTI.  PHYSICAL EXAMINATION:  LUNGS:  Clear.  No rales, wheezes, or rhonchi. HEART:  Regular rhythm.  No S3, S4.  No heaves, thrills, or rubs. ABDOMEN:  Soft, nontender.  Bowel sounds normoactive. VITAL SIGNS:  Blood pressure 138/82, pulse is 80 and regular, and she is afebrile.  PLAN:  Right now is to continue IV Rocephin.  Monitor CBC as well as BMET in a.m.  No evidence of upper respiratory infection.  Awaiting stool cultures at present for history of diarrhea prior to admission.     Melvyn Novasichard Michael Dara Beidleman, MD     RMD/MEDQ  D:  12/20/2013  T:  12/21/2013  Job:  [id number]

## 2013-12-21 NOTE — Progress Notes (Signed)
Discussed with patient's daughter, Clayborne Danaatti, Dr. Janna Archondiego decision not to consult Dr. Juanetta GoslingHawkins. Daughter, Clayborne Danaatti expressed concerned for continuing Dr. Janna Archondiego as PCP. Dr. Janna Archondiego notified. Dr. Janna Archondiego to find new PCP for patient. Dr. Janna Archondiego called and stated Dr. Juanetta GoslingHawkins will be taking over patient's care.

## 2013-12-21 NOTE — Care Management Note (Addendum)
    Page 1 of 2   12/24/2013     2:01:25 PM   CARE MANAGEMENT NOTE 12/24/2013  Patient:  [name],[name]   Account Number:  [id number]  Date Initiated:  12/21/2013  Documentation initiated by:  Rosemary HolmsOBSON,Green [place]  Subjective/Objective Assessment:   Pt admitted from home where she lives with her daughter. Daughter concerned that care giver in home gave her mother a cough. Daughter requesting a new PCP after Dr. Janna Fleming refused to consult with Pulmonologist.     Action/Plan:   CM offered a list of primary care physicians to daughter for after DC. CM spoke to [employer] Christina regarding pt wanting a new PCP and a referral to Dr. Juanetta Fleming.   Anticipated DC Date:  12/22/2013   Anticipated DC Plan:  HOME W HOME HEALTH SERVICES      DC Planning Services  CM consult      Choice offered to / List presented to:          [facility]  [facility] RN  HH-10 DISEASE MANAGEMENT  HH-2 PT  HH-3 OT  HH-4 NURSE'S AIDE  HH-5 SPEECH THERAPY  HH-6 SOCIAL WORKER      HH agency  [facility]   Status of service:  Completed, signed off Medicare Important Message given?   (If response is "NO", the following Medicare IM given date fields will be blank) Date Medicare IM given:   Date Additional Medicare IM given:    Discharge Disposition:    Per UR Regulation:    If discussed at Long Length of Stay Meetings, dates discussed:   12/23/2013    Comments:  12/24/13 Rosemary HolmsAmy Kaleah Hagemeister RN BSN CM Hopeful for DC today but pt has pulmonary edema.  12/22/13 Samie Reasons Leanord Hawkingobson RN BSN CM Dr. Juanetta Fleming has assumed care. CM followed up on Speech Therapy. Order not in system, verbal order given by [name] and notified by Therapy that speech would be available tomorrow. Dr. Juanetta Fleming aware. No PT recommended.  12/21/13 Devinn Hurwitz Leanord Hawkingobson RN BSN CM

## 2013-12-22 LAB — BASIC METABOLIC PANEL
BUN: 9 mg/dL (ref 6–23)
CHLORIDE: 106 meq/L (ref 96–112)
CO2: 29 meq/L (ref 19–32)
CREATININE: 0.73 mg/dL (ref 0.50–1.10)
Calcium: 9.2 mg/dL (ref 8.4–10.5)
GFR calc Af Amer: 87 mL/min — ABNORMAL LOW (ref >90)
GFR calc non Af Amer: 75 mL/min — ABNORMAL LOW (ref >90)
GLUCOSE: 103 mg/dL — AB (ref 70–99)
Potassium: 4 mEq/L (ref 3.7–5.3)
Sodium: 143 mEq/L (ref 137–147)

## 2013-12-22 LAB — RPR

## 2013-12-22 LAB — VITAMIN B12: Vitamin B-12: 1192 pg/mL — ABNORMAL HIGH (ref 211–911)

## 2013-12-22 LAB — FOLATE: FOLATE: 9.8 ng/mL

## 2013-12-22 LAB — TSH
TSH: 1.46 u[IU]/mL (ref 0.350–4.500)
TSH: 2.19 u[IU]/mL (ref 0.350–4.500)

## 2013-12-22 MED ORDER — GUAIFENESIN-DM 100-10 MG/5ML PO SYRP
5.0000 mL | ORAL_SOLUTION | ORAL | Status: DC
  Administered 2013-12-22 – 2013-12-25 (×13): 5 mL via ORAL
  Filled 2013-12-22 (×19): qty 5

## 2013-12-22 NOTE — Progress Notes (Signed)
NAMLendell Caprice:  Pinzon, Yosselin                  ACCOUNT NO.:  [id number]  MEDICAL RECORD NO.:  [id number]  LOCATION:  A325                          FACILITY:  [facility]  PHYSICIAN:  Melvyn Novasichard Michael Ryah Cribb, MDDATE OF BIRTH:  Jan 30, 1924  DATE OF PROCEDURE:  12/21/2013 DATE OF DISCHARGE:                                PROGRESS NOTE   This is an 80 year old white female with advanced dementia who lives at home with her daughter admitted for essentially UTI with wbc's TNTC. She has some hyponatremia, which is now corrected.  She has baseline dementia, which is chronic and severe.  Daughter has taken her off Namenda, I believe.  Daughter is very concerned about a chronic cough the patient has.  She does have a hiatal hernia and, however, swallowing study reveals no evidence of swallowing abnormalities of significance and was placed on Protonix today for the possibility of acid reflux induced cough.  Lungs are clear.  Chest x-ray was clear twice in the last 2 weeks.  No evidence of reactive airway disease or infiltrate. Currently, on Rocephin for UTI.  The patient is hemodynamically stable. Currently, on Rocephin.  Observing.  We will add Protonix today. Daughter mentions something about the pulmonary consultant.  If she would like this, I will be __________ her hopefully and we will make further recommendations as the database expands.     Melvyn Novasichard Michael Temiloluwa Laredo, MD     RMD/MEDQ  D:  12/21/2013  T:  12/21/2013  Job:  [id number]

## 2013-12-22 NOTE — Progress Notes (Signed)
Subjective: This is an assumption of care note. I discussed her situation with her daughter by telephone. Her daughter's concerns are that she still has significant cough which was not present at home. She was exposed to someone who had an upper respiratory infection or allergies and is concerned that that may be part of the problem. She is also concerned that her mother has not been up since she's been hospitalized. Her chest x-ray does not reveal a cause of the cough. She had bedside swallow evaluation but does not reveal a cause of cough. I have noted that she is on lisinopril which is a common cause of cough even if patients have been on it for some time.  Objective: Vital signs in last 24 hours: Temp:  [97.9 F (36.6 C)-98.1 F (36.7 C)] 97.9 F (36.6 C) (04/08 0502) Pulse Rate:  [78-86] 86 (04/08 0502) Resp:  [18-20] 18 (04/08 0502) BP: (152-159)/(64-81) 152/81 mmHg (04/08 0502) SpO2:  [96 %-98 %] 98 % (04/08 0502) Weight change:  Last BM Date: 12/18/13  Intake/Output from previous day: 04/07 0701 - 04/08 0700 In: 65 [P.O.:65] Out: -   PHYSICAL EXAM General appearance: alert and Mildly confused and she is not actually coughing during my examination Resp: rhonchi bilaterally Cardio: regular rate and rhythm, S1, S2 normal, no murmur, click, rub or gallop GI: soft, non-tender; bowel sounds normal; no masses,  no organomegaly Extremities: extremities normal, atraumatic, no cyanosis or edema  Lab Results:    Basic Metabolic Panel:  Recent Labs  29/56/21 0630 12/22/13 0519  NA 142 143  K 4.2 4.0  CL 106 106  CO2 27 29  GLUCOSE 101* 103*  BUN 14 9  CREATININE 0.72 0.73  CALCIUM 8.8 9.2   Liver Function Tests: No results found for this basename: AST, ALT, ALKPHOS, BILITOT, PROT, ALBUMIN,  in the last 72 hours No results found for this basename: LIPASE, AMYLASE,  in the last 72 hours No results found for this basename: AMMONIA,  in the last 72 hours CBC:  Recent  Labs  12/21/13 0630 12/21/13 1751  WBC 6.0 4.9  NEUTROABS 2.9  --   HGB 11.0* 11.3*  HCT 33.8* 34.3*  MCV 89.9 90.0  PLT 150 188   Cardiac Enzymes: No results found for this basename: CKTOTAL, CKMB, CKMBINDEX, TROPONINI,  in the last 72 hours BNP: No results found for this basename: PROBNP,  in the last 72 hours D-Dimer: No results found for this basename: DDIMER,  in the last 72 hours CBG: No results found for this basename: GLUCAP,  in the last 72 hours Hemoglobin A1C: No results found for this basename: HGBA1C,  in the last 72 hours Fasting Lipid Panel: No results found for this basename: CHOL, HDL, LDLCALC, TRIG, CHOLHDL, LDLDIRECT,  in the last 72 hours Thyroid Function Tests:  Recent Labs  12/21/13 1751  TSH 1.460   Anemia Panel:  Recent Labs  12/21/13 1751  VITAMINB12 1192*  FOLATE 9.8   Coagulation: No results found for this basename: LABPROT, INR,  in the last 72 hours Urine Drug Screen: Drugs of Abuse  No results found for this basename: labopia, cocainscrnur, labbenz, amphetmu, thcu, labbarb    Alcohol Level: No results found for this basename: ETH,  in the last 72 hours Urinalysis: No results found for this basename: COLORURINE, APPERANCEUR, LABSPEC, PHURINE, GLUCOSEU, HGBUR, BILIRUBINUR, KETONESUR, PROTEINUR, UROBILINOGEN, NITRITE, LEUKOCYTESUR,  in the last 72 hours Misc. Labs:  ABGS No results found for this basename: PHART,  PCO2, PO2ART, TCO2, HCO3,  in the last 72 hours CULTURES Recent Results (from the past 240 hour(s))  URINE CULTURE     Status: None   Collection Time    12/18/13  7:50 PM      Result Value Ref Range Status   Specimen Description URINE, CATHETERIZED   Final   Special Requests NONE   Final   Culture  Setup Time     Final   Value: 12/20/2013 01:16     [facility]   Colony Count     Final   Value: NO GROWTH     [facility]   Culture     Final   Value: NO GROWTH     [facility]formed  at [facility]   Report Status 12/20/2013 FINAL   Final   Studies/Results: No results found.  Medications:  Prior to Admission:  Prescriptions prior to admission  Medication Sig Dispense Refill  . guaiFENesin-dextromethorphan (ROBITUSSIN DM) 100-10 MG/5ML syrup Take 5 mLs by mouth every 4 (four) hours as needed for cough.  118 mL  0  . ketoconazole (NIZORAL) 2 % cream Apply 1 application topically daily.      Marland Kitchen. LISINOPRIL-HYDROCHLOROTHIAZIDE PO Take 5 mLs by mouth daily. Patient taking Lisinopril 20/12.5/435ml daily      . pantoprazole sodium (PROTONIX) 40 mg/20 mL PACK Take 20 mg by mouth 2 (two) times daily.      Marland Kitchen. azithromycin (ZITHROMAX) 200 MG/5ML suspension Take 200-400 mg by mouth See admin instructions. To  Take two teaspoonsful on day 1 then take one teaspoonful daily until all gone for 5 additional days       Scheduled: . cefTRIAXone (ROCEPHIN)  IV  1 g Intravenous Q24H  . guaiFENesin-dextromethorphan  5 mL Oral Q4H  . heparin  5,000 Units Subcutaneous 3 times per day  . pantoprazole  40 mg Oral Q1200  . pantoprazole sodium  20 mg Oral BID   Continuous: . sodium chloride 75 mL/hr at 12/22/13 0511   ZOX:[id number]:ondansetron (ZOFRAN) IV, ondansetron  Assesment: She was admitted with dehydration, probable urinary tract infection although apparently her culture is negative. She was hyponatremic which has resolved. At baseline she has dementia. She has a cough which may be related to lisinopril. Principal Problem:   Hyponatremia Active Problems:   UTI (urinary tract infection)   Viral syndrome   Diarrhea due to drug   Volume depletion    Plan: She will stay off lisinopril. I have changed her Robitussin to the way she takes it at home. She will continue her other treatments.    LOS: 4 days   Fredirick Maudlindward L Bland Rudzinski 12/22/2013, 8:35 AM

## 2013-12-22 NOTE — Progress Notes (Signed)
The patients daughter wants to make sure that she gets an opportunity to discuss her concerns with Dr Juanetta GoslingHawkins before he rounds on her mother. I let her know that I was not sure about the exact time that he would be making rounds, but that I would pass this info on in report. She also discussed that her mothers order for robitussin is usually Q4H PRN at home and that she would like it to be given at the same frequency while she is here. I will also pass this on in report to the day shift nurse taking over.

## 2013-12-22 NOTE — Evaluation (Signed)
Physical Therapy Evaluation Patient Details Name: [name] C Achor MRN: [id number] DOB: 03/29/1924 Today's Date: 12/22/2013   History of Present Illness  [name] C Litzau is an 80 y.o. female lives at home with her daughter as a primary care taker, hx of advanced dementia, schizophrenia, HTN (on ACE-I, diuretic combination pill), prior C diff, returned to the ER with persistent coughing and having diarrhea.  She was seen about a week ago, and was given Zithromax liquid antibiotics.  Daughter said she started to have watery diarrhea since taking the medication.  She was doing well according to her daughter until about a month ago, when she was exposed to a helper with a "cough".  She has been coughing since then.   Clinical Impression  [name] has advance dementia and is not able to follow verbal command but will respond to hand gestures and gentle prodding.  PT is not a skilled therapy candidate due to her advance dementia but does need 24/7 supervision.  Pt is able to be mobile with contact guard assist and should continued to be walked daily to keep this ability.      Follow Up Recommendations No PT follow up    Equipment Recommendations  None recommended by PT    Recommendations for Other Services   none    Precautions / Restrictions Precautions Precautions: None Restrictions Weight Bearing Restrictions: No      Mobility  Bed Mobility Overal bed mobility: Needs Assistance Bed Mobility: Supine to Sit     Supine to sit: Min assist        Transfers Overall transfer level: Needs assistance Equipment used: Rolling walker (2 wheeled) Transfers: Sit to/from Stand Sit to Stand: Min guard       Ambulation/Gait Ambulation/Gait assistance: Min guard Ambulation Distance (Feet): 120 Feet Assistive device: Rolling walker (2 wheeled)     Gait velocity interpretation: at or above normal speed for age/gender          Pertinent Vitals/Pain Facial expression= no pain    Home  Living Family/patient expects to be discharged to:: Private residence Living Arrangements: Children Available Help at Discharge: Family Type of Home: House                Prior Function Level of Independence: Needs assistance   Gait / Transfers Assistance Needed: RW Supervision  ADL's / Homemaking Assistance Needed: total        Hand Dominance   Dominant Hand: Right       Communication   Communication: No difficulties  Cognition Arousal/Alertness: Awake/alert   Overall Cognitive Status: History of cognitive impairments - at baseline                    Assessment/Plan    PT Assessment Patent does not need any further PT services  PT Diagnosis     PT Problem List    PT Treatment Interventions     PT Goals (Current goals can be found in the Care Plan section) Acute Rehab PT Goals PT Goal Formulation: No goals set, d/c therapy    Frequency     Barriers to discharge           End of Session Equipment Utilized During Treatment: Gait belt Activity Tolerance: Patient tolerated treatment well Patient left: in chair;with call bell/phone within reach;with chair alarm set           Time: 1120-1149 PT Time Calculation (min): 29 min   Charges:   PT Evaluation $Initial  PT Evaluation Tier I: 1 Procedure          Bella Kennedy 12/22/2013, 11:49 AM

## 2013-12-22 NOTE — Evaluation (Signed)
Clinical/Bedside Swallow Evaluation Patient Details  Name: [name] MRN: [id number] Date of Birth: 04/19/1924  Today's Date: 12/22/2013 Time: 0400-0430 SLP Time Calculation (min): 30 min  Past Medical History:  Past Medical History  Diagnosis Date  . Hypertension   . Reflux   . Schizophrenia   . Alzheimer disease   . Chronic back pain   . Clostridium difficile colitis   . Hiatal hernia   . Vertebral compression fracture    Past Surgical History:  Past Surgical History  Procedure Laterality Date  . Carotid stent    . Tumor of lung -benign , and removed.    . Carotid endarterectomy    . Lung removal, partial     HPI:  [name] is an 80 y.o. female lives at home with her daughter as a primary care taker, hx of advanced dementia, schizophrenia, HTN (on ACE-I, diuretic combination pill), prior Fleming diff, returned to the ER with persistent coughing and having diarrhea.  She was seen about a week ago, and was given Zithromax liquid antibiotics.  Daughter said she started to have watery diarrhea since taking the medication.  She was doing well according to her daughter until about a month ago, when she was exposed to a helper with a "cough".  She has been coughing since then.     Assessment / Plan / Recommendation Clinical Impression  Pt was seen in room today for bedside evaluation of swallow function. Pt presented with advanced dementia that made following directions difficult during oral mechanism examination. However, she was able to consume thin liquids and pureed foods without overt s/sx of aspiration and/or penetration, which is her baseline diet. Recommend conitnuation of pureed diet with thin liquids with SLP f/u for tolerance and swallowing safety strategies.     Aspiration Risk  Mild    Diet Recommendation Thin liquid;Dysphagia 1 (Puree)   Liquid Administration via: Cup Medication Administration: Whole meds with liquid Supervision: Staff to assist with self  feeding;Intermittent supervision to cue for compensatory strategies Compensations: Slow rate;Small sips/bites;Check for pocketing Postural Changes and/or Swallow Maneuvers: Seated upright 90 degrees;Upright 30-60 min after meal;Out of bed for meals    Other  Recommendations Oral Care Recommendations: Oral care BID   Follow Up Recommendations       Frequency and Duration min 2x/week  2 weeks        Swallow Study Prior Functional Status   Pt lives at home with her daughter, who is her primary caregiver. Her daughter feeds her pureed foods with thin liquids only.     General Date of Onset: 12/18/13 HPI: [name] is an 80 y.o. female lives at home with her daughter as a primary care taker, hx of advanced dementia, schizophrenia, HTN (on ACE-I, diuretic combination pill), prior Fleming diff, returned to the ER with persistent coughing and having diarrhea.  She was seen about a week ago, and was given Zithromax liquid antibiotics.  Daughter said she started to have watery diarrhea since taking the medication.  She was doing well according to her daughter until about a month ago, when she was exposed to a helper with a "cough".  She has been coughing since then.   Type of Study: Bedside swallow evaluation Previous Swallow Assessment: RN swallow screen only, pt passed this and has been taking meds with water without difficulty.  Diet Prior to this Study: Dysphagia 1 (puree);Thin liquids Temperature Spikes Noted: N/A Behavior/Cognition: Alert;Confused;Requires cueing Self-Feeding Abilities: Needs assist Patient Positioning: Upright  in chair Baseline Vocal Quality: Clear;Low vocal intensity Volitional Cough: Cognitively unable to elicit Volitional Swallow: Unable to elicit    Oral/Motor/Sensory Function Overall Oral Motor/Sensory Function: Appears within functional limits for tasks assessed   Ice Chips Ice chips: Within functional limits   Thin Liquid Thin Liquid: Within functional limits     Nectar Thick Nectar Thick Liquid: Not tested   Honey Thick Honey Thick Liquid: Not tested   Puree Puree: Within functional limits   Solid   GO    Solid: Not tested Other Comments: Not tested d/t decreased cognition as well as baseline at pureed foods.       Chevella Pearce S [name] 12/22/2013,6:07 PM

## 2013-12-23 LAB — BASIC METABOLIC PANEL
BUN: 8 mg/dL (ref 6–23)
CO2: 30 meq/L (ref 19–32)
CREATININE: 0.75 mg/dL (ref 0.50–1.10)
Calcium: 9 mg/dL (ref 8.4–10.5)
Chloride: 105 mEq/L (ref 96–112)
GFR calc Af Amer: 86 mL/min — ABNORMAL LOW (ref >90)
GFR calc non Af Amer: 74 mL/min — ABNORMAL LOW (ref >90)
GLUCOSE: 97 mg/dL (ref 70–99)
Potassium: 3.9 mEq/L (ref 3.7–5.3)
Sodium: 142 mEq/L (ref 137–147)

## 2013-12-23 MED ORDER — SACCHAROMYCES BOULARDII 250 MG PO CAPS
250.0000 mg | ORAL_CAPSULE | Freq: Two times a day (BID) | ORAL | Status: DC
  Administered 2013-12-24: 250 mg via ORAL
  Filled 2013-12-23 (×2): qty 1

## 2013-12-23 MED ORDER — AMOXICILLIN 250 MG/5ML PO SUSR
250.0000 mg | Freq: Three times a day (TID) | ORAL | Status: DC
  Administered 2013-12-23 – 2013-12-25 (×4): 250 mg via ORAL
  Filled 2013-12-23 (×13): qty 5

## 2013-12-23 MED ORDER — AMOXICILLIN 250 MG/5ML PO SUSR
ORAL | Status: AC
  Filled 2013-12-23: qty 5

## 2013-12-23 NOTE — Progress Notes (Signed)
Subjective: She looks okay. She is not coughing mom in the room according to her daughter she has been coughing a lot when she's up. She's not coughing anything up.  Objective: Vital signs in last 24 hours: Temp:  [98.1 F (36.7 C)-98.7 F (37.1 C)] 98.3 F (36.8 C) (04/09 0533) Pulse Rate:  [76-77] 77 (04/08 2017) Resp:  [20] 20 (04/09 0533) BP: (136-152)/(56-87) 136/87 mmHg (04/09 0533) SpO2:  [96 %-98 %] 97 % (04/09 0533) Weight change:  Last BM Date: 12/22/13  Intake/Output from previous day: 04/08 0701 - 04/09 0700 In: 135 [P.O.:85; IV Piggyback:50] Out: -   PHYSICAL EXAM General appearance: She is awake but confused Resp: clear to auscultation bilaterally Cardio: regular rate and rhythm, S1, S2 normal, no murmur, click, rub or gallop GI: soft, non-tender; bowel sounds normal; no masses,  no organomegaly Extremities: extremities normal, atraumatic, no cyanosis or edema  Lab Results:    Basic Metabolic Panel:  Recent Labs  98/07/9103/08/15 0519 12/23/13 0628  NA 143 142  K 4.0 3.9  CL 106 105  CO2 29 30  GLUCOSE 103* 97  BUN 9 8  CREATININE 0.73 0.75  CALCIUM 9.2 9.0   Liver Function Tests: No results found for this basename: AST, ALT, ALKPHOS, BILITOT, PROT, ALBUMIN,  in the last 72 hours No results found for this basename: LIPASE, AMYLASE,  in the last 72 hours No results found for this basename: AMMONIA,  in the last 72 hours CBC:  Recent Labs  12/21/13 0630 12/21/13 1751  WBC 6.0 4.9  NEUTROABS 2.9  --   HGB 11.0* 11.3*  HCT 33.8* 34.3*  MCV 89.9 90.0  PLT 150 188   Cardiac Enzymes: No results found for this basename: CKTOTAL, CKMB, CKMBINDEX, TROPONINI,  in the last 72 hours BNP: No results found for this basename: PROBNP,  in the last 72 hours D-Dimer: No results found for this basename: DDIMER,  in the last 72 hours CBG: No results found for this basename: GLUCAP,  in the last 72 hours Hemoglobin A1C: No results found for this basename:  HGBA1C,  in the last 72 hours Fasting Lipid Panel: No results found for this basename: CHOL, HDL, LDLCALC, TRIG, CHOLHDL, LDLDIRECT,  in the last 72 hours Thyroid Function Tests:  Recent Labs  12/22/13 0519  TSH 2.190   Anemia Panel:  Recent Labs  12/21/13 1751  VITAMINB12 1192*  FOLATE 9.8   Coagulation: No results found for this basename: LABPROT, INR,  in the last 72 hours Urine Drug Screen: Drugs of Abuse  No results found for this basename: labopia, cocainscrnur, labbenz, amphetmu, thcu, labbarb    Alcohol Level: No results found for this basename: ETH,  in the last 72 hours Urinalysis: No results found for this basename: COLORURINE, APPERANCEUR, LABSPEC, PHURINE, GLUCOSEU, HGBUR, BILIRUBINUR, KETONESUR, PROTEINUR, UROBILINOGEN, NITRITE, LEUKOCYTESUR,  in the last 72 hours Misc. Labs:  ABGS No results found for this basename: PHART, PCO2, PO2ART, TCO2, HCO3,  in the last 72 hours CULTURES Recent Results (from the past 240 hour(s))  URINE CULTURE     Status: None   Collection Time    12/18/13  7:50 PM      Result Value Ref Range Status   Specimen Description URINE, CATHETERIZED   Final   Special Requests NONE   Final   Culture  Setup Time     Final   Value: 12/20/2013 01:16     Performed at [facility]   Colony Count  Final   Value: NO GROWTH     Performed at [facility]   Culture     Final   Value: NO GROWTH     Performed at [facility]   Report Status 12/20/2013 FINAL   Final   Studies/Results: No results found.  Medications:  Prior to Admission:  Prescriptions prior to admission  Medication Sig Dispense Refill  . guaiFENesin-dextromethorphan (ROBITUSSIN DM) 100-10 MG/5ML syrup Take 5 mLs by mouth every 4 (four) hours as needed for cough.  118 mL  0  . ketoconazole (NIZORAL) 2 % cream Apply 1 application topically daily.      Marland Kitchen LISINOPRIL-HYDROCHLOROTHIAZIDE PO Take 5 mLs by mouth daily. Patient taking Lisinopril  20/12.5/77ml daily      . pantoprazole sodium (PROTONIX) 40 mg/20 mL PACK Take 20 mg by mouth 2 (two) times daily.      Marland Kitchen azithromycin (ZITHROMAX) 200 MG/5ML suspension Take 200-400 mg by mouth See admin instructions. To  Take two teaspoonsful on day 1 then take one teaspoonful daily until all gone for 5 additional days       Scheduled: . cefTRIAXone (ROCEPHIN)  IV  1 g Intravenous Q24H  . guaiFENesin-dextromethorphan  5 mL Oral Q4H  . heparin  5,000 Units Subcutaneous 3 times per day  . pantoprazole sodium  20 mg Oral BID   Continuous: . sodium chloride 75 mL/hr at 12/23/13 0739   ZOX:[id number] (ZOFRAN) IV, ondansetron  Assesment: She was admitted with hyponatremia. She did not have a urinary tract infection. She has a cough which I think is probably related to a viral illness but he may be bacterial Principal Problem:   Hyponatremia Active Problems:   UTI (urinary tract infection)   Viral syndrome   Diarrhea due to drug   Volume depletion    Plan: I'm going to give her some low-dose amoxicillin and a probiotic. I discussed with her family that we probably can get her home tomorrow    LOS: 5 days   Fredirick Maudlin 12/23/2013, 8:53 AM

## 2013-12-24 ENCOUNTER — Inpatient Hospital Stay (HOSPITAL_COMMUNITY): Payer: Medicare Other

## 2013-12-24 LAB — GLUCOSE, CAPILLARY: GLUCOSE-CAPILLARY: 106 mg/dL — AB (ref 70–99)

## 2013-12-24 MED ORDER — AMOXICILLIN 250 MG/5ML PO SUSR: 250.0000 mg | Freq: Three times a day (TID) | ORAL | Status: DC

## 2013-12-24 MED ORDER — FUROSEMIDE 10 MG/ML IJ SOLN
40.0000 mg | Freq: Once | INTRAMUSCULAR | Status: AC
  Administered 2013-12-24: 40 mg via INTRAVENOUS
  Filled 2013-12-24 (×2): qty 4

## 2013-12-24 MED ORDER — ENSURE PUDDING PO PUDG: 1.0000 | Freq: Three times a day (TID) | ORAL | Status: DC

## 2013-12-24 MED ORDER — FUROSEMIDE 10 MG/ML IJ SOLN
40.0000 mg | Freq: Once | INTRAMUSCULAR | Status: AC
  Administered 2013-12-24: 40 mg via INTRAVENOUS
  Filled 2013-12-24: qty 4

## 2013-12-24 MED ORDER — SODIUM CHLORIDE 0.9 % IV SOLN: INTRAVENOUS | Status: DC

## 2013-12-24 MED ORDER — SACCHAROMYCES BOULARDII 250 MG PO CAPS: 250.0000 mg | ORAL_CAPSULE | Freq: Two times a day (BID) | ORAL | Status: DC

## 2013-12-24 NOTE — Progress Notes (Signed)
Patient refused to take PM medications. Encouraged patient to drink some fluids and patient refused to do so also. Will continue to monitor patient at this time.

## 2013-12-24 NOTE — Progress Notes (Signed)
INITIAL NUTRITION ASSESSMENT  DOCUMENTATION CODES Per approved criteria  -Not Applicable   INTERVENTION: Ensure Pudding po TID, each supplement provides 170 kcal and 4 grams of protein  NUTRITION DIAGNOSIS: Inadequate oral intake related to decreased appetite as evidenced by PO: 0-50%.   Goal: Pt will meet >90% of estimated nutritional needs  Monitor:  PO intake, labs, skin assessments, weight changes, I/O's  Reason for Assessment: LOS, poor po's  80 y.o. female  Admitting Dx: Hyponatremia  ASSESSMENT: Pt admitted from home with UTI and hyponatremia. Home diet is dysphagia 1. Noted that pt is lactose in tolerant.  Pt has been evaluated by SLP and nursing for swallow safety and both disciplines have assessed that pt is able to swallow safely. While pt tolerates diet well, intake has been very poor for entire admission- average meal intake 0-50%. Additionally, intake has been declining due to poor responsiveness. Unfortunately, unable to examine pt at time of visit, due to pt being assessed by other members of the healthcare team at time of visit.  Due to poor responsiveness, suspect pt would tolerate a pudding thick type supplement better than a liquid supplement. Will add Ensure Pudding. Wt hx reveals 8.6% wt loss over one year ago, which is not clinically significant. Also noted 4.4% wt gain x approximately one week, which is positive given wt decrease and poor po intake.     Height: Ht Readings from Last 1 Encounters:  12/18/13 5\' 2"  (1.575 m)    Weight: Wt Readings from Last 1 Encounters:  12/24/13 117 lb 1.6 oz (53.116 kg)    Ideal Body Weight: 110#  % Ideal Body Weight: 106%  Wt Readings from Last 10 Encounters:  12/24/13 117 lb 1.6 oz (53.116 kg)  12/11/13 112 lb (50.803 kg)  10/02/12 128 lb (58.06 kg)    Usual Body Weight: 128#  % Usual Body Weight: 91%  BMI:  Body mass index is 21.41 kg/(m^2). Meets criteria for normal weight.   Estimated Nutritional  Needs: Kcal: 1325-1590 daily Protein: 53-64 grams daily Fluid: 1.3-1.6 L daily  Skin: Intact  Diet Order: Dysphagia  EDUCATION NEEDS: -Education not appropriate at this time   Intake/Output Summary (Last 24 hours) at 12/24/13 1552 Last data filed at 12/24/13 1349  Gross per 24 hour  Intake 846.25 ml  Output      0 ml  Net 846.25 ml    Last BM: 12/23/13  Labs:   Recent Labs Lab 12/21/13 0630 12/22/13 0519 12/23/13 0628  NA 142 143 142  K 4.2 4.0 3.9  CL 106 106 105  CO2 27 29 30   BUN 14 9 8   CREATININE 0.72 0.73 0.75  CALCIUM 8.8 9.2 9.0  GLUCOSE 101* 103* 97    CBG (last 3)   Recent Labs  12/24/13 0610  GLUCAP 106*    Scheduled Meds: . amoxicillin  250 mg Oral TID  . furosemide  40 mg Intravenous Once  . guaiFENesin-dextromethorphan  5 mL Oral Q4H  . heparin  5,000 Units Subcutaneous 3 times per day  . pantoprazole sodium  20 mg Oral BID  . saccharomyces boulardii  250 mg Oral BID    Continuous Infusions: . sodium chloride 10 mL/hr at 12/24/13 1359    Past Medical History  Diagnosis Date  . Hypertension   . Reflux   . Schizophrenia   . Alzheimer disease   . Chronic back pain   . Clostridium difficile colitis   . Hiatal hernia   . Vertebral compression  fracture     Past Surgical History  Procedure Laterality Date  . Carotid stent    . Tumor of lung -benign , and removed.    . Carotid endarterectomy    . Lung removal, partial      Diora Bellizzi A. Mayford KnifeWilliams, RD, LDN Pager: [phone]

## 2013-12-24 NOTE — Progress Notes (Signed)
Subjective: She is overall about the same. She is poorly responsive. She is not coughing while I am in the room but her daughter reports that she had significant cough yesterday  Objective: Vital signs in last 24 hours: Temp:  [97.3 F (36.3 C)-98.6 F (37 C)] 97.3 F (36.3 C) (04/10 0433) Pulse Rate:  [59-72] 59 (04/10 0433) Resp:  [20] 20 (04/10 0433) BP: (137-160)/(42-60) 137/42 mmHg (04/10 0433) SpO2:  [97 %-98 %] 98 % (04/10 0433) Weight:  [53.116 kg (117 lb 1.6 oz)] 53.116 kg (117 lb 1.6 oz) (04/10 0433) Weight change:  Last BM Date: 12/23/13  Intake/Output from previous day: 04/09 0701 - 04/10 0700 In: 816.3 [P.O.:10; I.V.:806.3] Out: -   PHYSICAL EXAM General appearance: alert and Poorly responsive. She will open her eyes and attempts to speak to me but really not able to answer questions Resp: clear to auscultation bilaterally Cardio: regular rate and rhythm, S1, S2 normal, no murmur, click, rub or gallop GI: soft, non-tender; bowel sounds normal; no masses,  no organomegaly Extremities: extremities normal, atraumatic, no cyanosis or edema  Lab Results:    Basic Metabolic Panel:  Recent Labs  16/10/96 0519 12/23/13 0628  NA 143 142  K 4.0 3.9  CL 106 105  CO2 29 30  GLUCOSE 103* 97  BUN 9 8  CREATININE 0.73 0.75  CALCIUM 9.2 9.0   Liver Function Tests: No results found for this basename: AST, ALT, ALKPHOS, BILITOT, PROT, ALBUMIN,  in the last 72 hours No results found for this basename: LIPASE, AMYLASE,  in the last 72 hours No results found for this basename: AMMONIA,  in the last 72 hours CBC:  Recent Labs  12/21/13 1751  WBC 4.9  HGB 11.3*  HCT 34.3*  MCV 90.0  PLT 188   Cardiac Enzymes: No results found for this basename: CKTOTAL, CKMB, CKMBINDEX, TROPONINI,  in the last 72 hours BNP: No results found for this basename: PROBNP,  in the last 72 hours D-Dimer: No results found for this basename: DDIMER,  in the last 72  hours CBG:  Recent Labs  12/24/13 0610  GLUCAP 106*   Hemoglobin A1C: No results found for this basename: HGBA1C,  in the last 72 hours Fasting Lipid Panel: No results found for this basename: CHOL, HDL, LDLCALC, TRIG, CHOLHDL, LDLDIRECT,  in the last 72 hours Thyroid Function Tests:  Recent Labs  12/22/13 0519  TSH 2.190   Anemia Panel:  Recent Labs  12/21/13 1751  VITAMINB12 1192*  FOLATE 9.8   Coagulation: No results found for this basename: LABPROT, INR,  in the last 72 hours Urine Drug Screen: Drugs of Abuse  No results found for this basename: labopia, cocainscrnur, labbenz, amphetmu, thcu, labbarb    Alcohol Level: No results found for this basename: ETH,  in the last 72 hours Urinalysis: No results found for this basename: COLORURINE, APPERANCEUR, LABSPEC, PHURINE, GLUCOSEU, HGBUR, BILIRUBINUR, KETONESUR, PROTEINUR, UROBILINOGEN, NITRITE, LEUKOCYTESUR,  in the last 72 hours Misc. Labs:  ABGS No results found for this basename: PHART, PCO2, PO2ART, TCO2, HCO3,  in the last 72 hours CULTURES Recent Results (from the past 240 hour(s))  URINE CULTURE     Status: None   Collection Time    12/18/13  7:50 PM      Result Value Ref Range Status   Specimen Description URINE, CATHETERIZED   Final   Special Requests NONE   Final   Culture  Setup Time     Final  Value: 12/20/2013 01:16     Performed at [facility]   Colony Count     Final   Value: NO GROWTH     Performed at [facility]   Culture     Final   Value: NO GROWTH     Performed at [facility]   Report Status 12/20/2013 FINAL   Final   Studies/Results: No results found.  Medications:  Prior to Admission:  Prescriptions prior to admission  Medication Sig Dispense Refill  . guaiFENesin-dextromethorphan (ROBITUSSIN DM) 100-10 MG/5ML syrup Take 5 mLs by mouth every 4 (four) hours as needed for cough.  118 mL  0  . ketoconazole (NIZORAL) 2 % cream Apply 1 application  topically daily.      Marland Kitchen. LISINOPRIL-HYDROCHLOROTHIAZIDE PO Take 5 mLs by mouth daily. Patient taking Lisinopril 20/12.5/155ml daily      . pantoprazole sodium (PROTONIX) 40 mg/20 mL PACK Take 20 mg by mouth 2 (two) times daily.      Marland Kitchen. azithromycin (ZITHROMAX) 200 MG/5ML suspension Take 200-400 mg by mouth See admin instructions. To  Take two teaspoonsful on day 1 then take one teaspoonful daily until all gone for 5 additional days       Scheduled: . amoxicillin  250 mg Oral TID  . guaiFENesin-dextromethorphan  5 mL Oral Q4H  . heparin  5,000 Units Subcutaneous 3 times per day  . pantoprazole sodium  20 mg Oral BID  . saccharomyces boulardii  250 mg Oral BID   Continuous: . sodium chloride 75 mL/hr at 12/23/13 0739   ZOX:[id number]:ondansetron (ZOFRAN) IV, ondansetron  Assesment: She has cough. This may be related to ACE inhibitor. She was admitted with what was thought to be a urinary tract infection but urine culture was negative. This may be viral syndrome. She was volume depleted on admission and hyponatremic which has resolved. She is not clearly aspirating. Principal Problem:   Hyponatremia Active Problems:   UTI (urinary tract infection)   Viral syndrome   Diarrhea due to drug   Volume depletion    Plan: No change in treatments. Check a chest x-ray to be sure that she is not having a pneumonia that was not seen on initial film    LOS: 6 days   Fredirick Maudlindward L Aylene Acoff 12/24/2013, 8:47 AM

## 2013-12-24 NOTE — Progress Notes (Signed)
Late entry:  Dr. Juanetta GoslingHawkins notified of results of chest x-ray.  Gave order to decrease fluids to NS at k[facility], ordered two doses of Lasix for 1330 and 2000 tonight and repeat chest x-ray in the morning.  Dr. Juanetta GoslingHawkins stated he had left message to notify daughter.

## 2013-12-24 NOTE — Progress Notes (Signed)
Speech Language Pathology Treatment:    Patient Details Name: [name] MRN: [id number] DOB: 02/21/1924 Today's Date: 12/24/2013 Time:  1:10- 1:25    Assessment / Plan / Recommendation Clinical Impression  SLP followed up with pt today to confirm her tolerance of recommended diet, puree/thin liquids which is pt's baseline. Pt was consuming lunch fed by daughter. No signs of swallow deficit were observed with this diet. SLP educated daughter on continuation of this diet as well as safe swallow strategies. Daughter receptive. SLP to f/u PRN.    HPI HPI: [name] is an 80 y.o. female lives at home with her daughter as a primary care taker, hx of advanced dementia, schizophrenia, HTN (on ACE-I, diuretic combination pill), prior C diff, returned to the ER with persistent coughing and having diarrhea.  She was seen about a week ago, and was given Zithromax liquid antibiotics.  Daughter said she started to have watery diarrhea since taking the medication.  She was doing well according to her daughter until about a month ago, when she was exposed to a helper with a "cough".  She has been coughing since then.     Pertinent Vitals N/A  SLP Plan  Discharge SLP treatment due to (comment) (Pt consuming baseline diet safely.)    Recommendations Medication Administration: Whole meds with liquid Supervision: Staff to assist with self feeding;Intermittent supervision to cue for compensatory strategies Compensations: Slow rate;Small sips/bites;Check for pocketing Postural Changes and/or Swallow Maneuvers: Seated upright 90 degrees;Upright 30-60 min after meal;Out of bed for meals              Oral Care Recommendations: Oral care BID Plan: Discharge SLP treatment due to (comment) (Pt consuming baseline diet safely.)    GO     Julian Askin S Love Chowning 12/24/2013, 1:41 PM

## 2013-12-25 ENCOUNTER — Inpatient Hospital Stay (HOSPITAL_COMMUNITY): Payer: Medicare Other

## 2013-12-25 DIAGNOSIS — E8770 Fluid overload, unspecified: Secondary | ICD-10-CM | POA: Diagnosis present

## 2013-12-25 NOTE — Progress Notes (Signed)
Subjective: She had some volume overload yesterday probably from her volume replacement for dehydration. She received Lasix twice and looks much better. I discussed her situation with her daughter at bedside she understands and wants to take her home.  Objective: Vital signs in last 24 hours: Temp:  [98.2 F (36.8 C)-99.2 F (37.3 C)] 99 F (37.2 C) (04/11 0639) Pulse Rate:  [69-85] 69 (04/11 0639) Resp:  [18-20] 18 (04/11 0639) BP: (140-161)/(70-81) 151/71 mmHg (04/11 0639) SpO2:  [95 %-97 %] 95 % (04/11 0639) Weight change:  Last BM Date: 12/24/13  Intake/Output from previous day: 04/10 0701 - 04/11 0700 In: 66.2 [P.O.:40; I.V.:26.2] Out: -   PHYSICAL EXAM General appearance: alert, no distress and Still confused but more alert than last visit Resp: clear to auscultation bilaterally Cardio: regular rate and rhythm, S1, S2 normal, no murmur, click, rub or gallop GI: soft, non-tender; bowel sounds normal; no masses,  no organomegaly Extremities: extremities normal, atraumatic, no cyanosis or edema  Lab Results:    Basic Metabolic Panel:  Recent Labs  16/10/96 0628  NA 142  K 3.9  CL 105  CO2 30  GLUCOSE 97  BUN 8  CREATININE 0.75  CALCIUM 9.0   Liver Function Tests: No results found for this basename: AST, ALT, ALKPHOS, BILITOT, PROT, ALBUMIN,  in the last 72 hours No results found for this basename: LIPASE, AMYLASE,  in the last 72 hours No results found for this basename: AMMONIA,  in the last 72 hours CBC: No results found for this basename: WBC, NEUTROABS, HGB, HCT, MCV, PLT,  in the last 72 hours Cardiac Enzymes: No results found for this basename: CKTOTAL, CKMB, CKMBINDEX, TROPONINI,  in the last 72 hours BNP: No results found for this basename: PROBNP,  in the last 72 hours D-Dimer: No results found for this basename: DDIMER,  in the last 72 hours CBG:  Recent Labs  12/24/13 0610  GLUCAP 106*   Hemoglobin A1C: No results found for this  basename: HGBA1C,  in the last 72 hours Fasting Lipid Panel: No results found for this basename: CHOL, HDL, LDLCALC, TRIG, CHOLHDL, LDLDIRECT,  in the last 72 hours Thyroid Function Tests: No results found for this basename: TSH, T4TOTAL, FREET4, T3FREE, THYROIDAB,  in the last 72 hours Anemia Panel: No results found for this basename: VITAMINB12, FOLATE, FERRITIN, TIBC, IRON, RETICCTPCT,  in the last 72 hours Coagulation: No results found for this basename: LABPROT, INR,  in the last 72 hours Urine Drug Screen: Drugs of Abuse  No results found for this basename: labopia, cocainscrnur, labbenz, amphetmu, thcu, labbarb    Alcohol Level: No results found for this basename: ETH,  in the last 72 hours Urinalysis: No results found for this basename: COLORURINE, APPERANCEUR, LABSPEC, PHURINE, GLUCOSEU, HGBUR, BILIRUBINUR, KETONESUR, PROTEINUR, UROBILINOGEN, NITRITE, LEUKOCYTESUR,  in the last 72 hours Misc. Labs:  ABGS No results found for this basename: PHART, PCO2, PO2ART, TCO2, HCO3,  in the last 72 hours CULTURES Recent Results (from the past 240 hour(s))  URINE CULTURE     Status: None   Collection Time    12/18/13  7:50 PM      Result Value Ref Range Status   Specimen Description URINE, CATHETERIZED   Final   Special Requests NONE   Final   Culture  Setup Time     Final   Value: 12/20/2013 01:16     Performed at [facility] Count     Final  Value: NO GROWTH     Performed at [facility]   Culture     Final   Value: NO GROWTH     Performed at [facility]   Report Status 12/20/2013 FINAL   Final   Studies/Results: Dg Chest 1 View  12/25/2013   CLINICAL DATA:  Reassess pulmonary edema  EXAM: CHEST - 1 VIEW  COMPARISON:  DG CHEST 1V PORT dated 12/24/2013  FINDINGS: The lungs are borderline hypoinflated. However, the interstitial markings have improved considerably. Previously demonstrated subsegmental atelectasis at the right lung base has  cleared. There is no significant pleural effusion. The cardiac silhouette is normal in size. The pulmonary vascularity is not engorged. The observed portions of the bony thorax appear normal.  IMPRESSION: There has been considerable improvement in the appearance of the pulmonary interstitium since the previous study. The there is no evidence of pneumonia nor pulmonary edema currently.   Electronically Signed   By: David  [place]   On: 12/25/2013 09:22   Dg Chest Port 1 View  12/24/2013   CLINICAL DATA:  Cough.  EXAM: PORTABLE CHEST - 1 VIEW  COMPARISON:  Two-view chest x-ray 12/18/2013, 12/11/2013 and portable chest x-ray 10/06/2006.  FINDINGS: Suboptimal inspiration accounts for crowded bronchovascular markings diffusely and atelectasis in the bases, and accentuates the cardiac silhouette. Taking this into account, cardiac silhouette upper normal in size to slightly enlarged but stable. Interval development of mild diffuse interstitial pulmonary edema since the examination 6 days ago. Possible small bilateral pleural effusions superimposed upon chronic pleura parenchymal scarring at the bases.  IMPRESSION: Mild CHF and/or fluid overload, with new mild interstitial pulmonary edema and probable small bilateral pleural effusions. Suboptimal inspiration accounts for bibasilar atelectasis.   Electronically Signed   By: Hulan Saas M.D.   On: 12/24/2013 12:48    Medications:  Prior to Admission:  Prescriptions prior to admission  Medication Sig Dispense Refill  . guaiFENesin-dextromethorphan (ROBITUSSIN DM) 100-10 MG/5ML syrup Take 5 mLs by mouth every 4 (four) hours as needed for cough.  118 mL  0  . ketoconazole (NIZORAL) 2 % cream Apply 1 application topically daily.      . pantoprazole sodium (PROTONIX) 40 mg/20 mL PACK Take 20 mg by mouth 2 (two) times daily.      . [DISCONTINUED] LISINOPRIL-HYDROCHLOROTHIAZIDE PO Take 5 mLs by mouth daily. Patient taking Lisinopril 20/12.5/49ml daily      .  [DISCONTINUED] azithromycin (ZITHROMAX) 200 MG/5ML suspension Take 200-400 mg by mouth See admin instructions. To  Take two teaspoonsful on day 1 then take one teaspoonful daily until all gone for 5 additional days       Scheduled: . amoxicillin  250 mg Oral TID  . feeding supplement (ENSURE)  1 Container Oral TID BM  . guaiFENesin-dextromethorphan  5 mL Oral Q4H  . heparin  5,000 Units Subcutaneous 3 times per day  . pantoprazole sodium  20 mg Oral BID  . saccharomyces boulardii  250 mg Oral BID   Continuous: . sodium chloride 10 mL/hr at 12/24/13 1359   ZOX:WRUEAVWUJWJ (ZOFRAN) IV, ondansetron  Assesment: She was admitted with present urinary tract infection and hyponatremia. She has had a severe cough which is better. She had dehydration on admission and received volume replacement and had some volume overload yesterday. This is improved today. I reviewed the chest x-ray personally and 1 yesterday was fairly poor quality with poor inspiratory effort but did show some volume overload today looks much improved Principal Problem:  Hyponatremia Active Problems:   UTI (urinary tract infection)   Viral syndrome   Diarrhea due to drug   Volume depletion    Plan: Discharge home    LOS: 7 days   Fredirick Maudlindward L Ashad Fawbush 12/25/2013, 10:06 AM

## 2013-12-25 NOTE — Discharge Summary (Signed)
Physician Discharge Summary  Patient ID: [name] MRN: [id number] DOB/AGE: 1924/08/16 80 y.o. Primary Care Physician:DONDIEGO,RICHARD M, MD Admit date: 12/18/2013 Discharge date: 12/25/2013    Discharge Diagnoses:   Principal Problem:   Hyponatremia Active Problems:   UTI (urinary tract infection)   Viral syndrome   Diarrhea due to drug   Volume depletion   Volume overload     Medication List    STOP taking these medications       azithromycin 200 MG/5ML suspension  Commonly known as:  ZITHROMAX     LISINOPRIL-HYDROCHLOROTHIAZIDE PO      TAKE these medications       amoxicillin 250 MG/5ML suspension  Commonly known as:  AMOXIL  Take 5 mLs (250 mg total) by mouth 3 (three) times daily.     guaiFENesin-dextromethorphan 100-10 MG/5ML syrup  Commonly known as:  ROBITUSSIN DM  Take 5 mLs by mouth every 4 (four) hours as needed for cough.     ketoconazole 2 % cream  Commonly known as:  NIZORAL  Apply 1 application topically daily.     pantoprazole sodium 40 mg/20 mL Pack  Commonly known as:  PROTONIX  Take 20 mg by mouth 2 (two) times daily.     saccharomyces boulardii 250 MG capsule  Commonly known as:  FLORASTOR  Take 1 capsule (250 mg total) by mouth 2 (two) times daily.        Discharged Condition: Improved    Consults: None  Significant Diagnostic Studies: Dg Chest 1 View  12/25/2013   CLINICAL DATA:  Reassess pulmonary edema  EXAM: CHEST - 1 VIEW  COMPARISON:  DG CHEST 1V PORT dated 12/24/2013  FINDINGS: The lungs are borderline hypoinflated. However, the interstitial markings have improved considerably. Previously demonstrated subsegmental atelectasis at the right lung base has cleared. There is no significant pleural effusion. The cardiac silhouette is normal in size. The pulmonary vascularity is not engorged. The observed portions of the bony thorax appear normal.  IMPRESSION: There has been considerable improvement in the appearance of the pulmonary  interstitium since the previous study. The there is no evidence of pneumonia nor pulmonary edema currently.   Electronically Signed   By: David  [place]   On: 12/25/2013 09:22   Dg Chest 2 View  12/18/2013   CLINICAL DATA:  Cough.  EXAM: CHEST  2 VIEW  COMPARISON:  12/11/2013.  FINDINGS: The cardiac silhouette, mediastinal and hilar contours are within normal limits and stable. There is tortuosity, ectasia and calcification of the thoracic aorta. A moderate size hiatal hernia the lungs are clear. No pleural effusion or pneumothorax. The bony thorax is intact. There are remote lower thoracic and upper lumbar compression fractures.  IMPRESSION: No acute cardiopulmonary findings.   Electronically Signed   By: Loralie Champagne M.D.   On: 12/18/2013 19:37   Dg Chest 2 View  12/11/2013   CLINICAL DATA:  Cough, chest congestion and chest pain.  EXAM: CHEST  2 VIEW  COMPARISON:  10/06/2006.  FINDINGS: Stable normal sized heart. The lungs remain clear. Stable surgical staples in the left lower lung. A moderate-sized hiatal hernia is unchanged. Diffuse osteopenia. Thoracolumbar vertebral compression deformities with anterior degenerative changes, compatible with old fractures. These have not changed significantly since 10/06/2006.  IMPRESSION: 1. No acute abnormality. 2. Stable moderate-sized hiatal hernia. 3. Old thoracolumbar vertebral compression fractures.   Electronically Signed   By: Gordan Payment M.D.   On: 12/11/2013 21:49   Dg Chest [facility] [address]  12/24/2013   CLINICAL DATA:  Cough.  EXAM: PORTABLE CHEST - 1 VIEW  COMPARISON:  Two-view chest x-ray 12/18/2013, 12/11/2013 and portable chest x-ray 10/06/2006.  FINDINGS: Suboptimal inspiration accounts for crowded bronchovascular markings diffusely and atelectasis in the bases, and accentuates the cardiac silhouette. Taking this into account, cardiac silhouette upper normal in size to slightly enlarged but stable. Interval development of mild diffuse interstitial  pulmonary edema since the examination 6 days ago. Possible small bilateral pleural effusions superimposed upon chronic pleura parenchymal scarring at the bases.  IMPRESSION: Mild CHF and/or fluid overload, with new mild interstitial pulmonary edema and probable small bilateral pleural effusions. Suboptimal inspiration accounts for bibasilar atelectasis.   Electronically Signed   By: Hulan Saas M.D.   On: 12/24/2013 12:48   Dg Abd 2 Views  12/11/2013   CLINICAL DATA:  Abdominal pain, weakness  EXAM: ABDOMEN - 2 VIEW  COMPARISON:  None.  FINDINGS: Nonobstructive bowel gas pattern.  No evidence of free air under the diaphragm on the upright view.  Postsurgical changes overlying the left lower lung.  Degenerative changes of the lumbar spine.  IMPRESSION: No evidence of small bowel obstruction or free air.   Electronically Signed   By: Charline Bills M.D.   On: 12/11/2013 21:54    Lab Results: Basic Metabolic Panel:  Recent Labs  40/98/11 0628  NA 142  K 3.9  CL 105  CO2 30  GLUCOSE 97  BUN 8  CREATININE 0.75  CALCIUM 9.0   Liver Function Tests: No results found for this basename: AST, ALT, ALKPHOS, BILITOT, PROT, ALBUMIN,  in the last 72 hours   CBC: No results found for this basename: WBC, NEUTROABS, HGB, HCT, MCV, PLT,  in the last 72 hours  Recent Results (from the past 240 hour(s))  URINE CULTURE     Status: None   Collection Time    12/18/13  7:50 PM      Result Value Ref Range Status   Specimen Description URINE, CATHETERIZED   Final   Special Requests NONE   Final   Culture  Setup Time     Final   Value: 12/20/2013 01:16     Performed at [facility] Count     Final   Value: NO GROWTH     Performed at [facility]   Culture     Final   Value: NO GROWTH     Performed at [facility]   Report Status 12/20/2013 FINAL   Final     Hospital Course: This is an 80 year old who came to the emergency department because of dehydration  and presented urinary tract infection. Her urine did not grow any organisms. She was hyponatremic. She was transferred to my service in the middle of her hospitalization at the request of Dr. Delbert Harness and the patient's daughter because of communication difficulties. She had a speech evaluation to see about chronic cough and it did not show any overt episodes of aspiration. She had a chest x-ray before discharge and it showed that she seem to have some volume overload probably from her volume repletion for dehydration and she was given Lasix twice with marked improvement in the chest x-ray. At the time of discharge was back to baseline.  Discharge Exam: Blood pressure 151/71, pulse 69, temperature 99 F (37.2 C), temperature source Axillary, resp. rate 18, height 5\' 2"  (1.575 m), weight 53.116 kg (117 lb 1.6 oz), SpO2 95.00%. She is awake  alert but somewhat confused. Her chest is clear. She does cough when she takes a deep breath. Her heart is regular. Her abdomen is soft  Disposition: Home with home health services      Discharge Orders   Future Orders Complete By Expires   Face-to-face encounter (required for Medicare/Medicaid patients)  As directed    Questions:     The encounter with the patient was in whole, or in part, for the following medical condition, which is the primary reason for home health care:  uti/weakness   I certify that, based on my findings, the following services are medically necessary home health services:  Nursing   Physical therapy   My clinical findings support the need for the above services:  Cognitive impairments, dementia, or mental confusion  that make it unsafe to leave home   Further, I certify that my clinical findings support that this patient is homebound due to:  Mental confusion   Reason for Medically Necessary Home Health Services:  Skilled Nursing- Change/Decline in Patient Status   Home Health  As directed    Questions:     To provide the following  care/treatments:  RN   PT        Signed: Fredirick MaudlinEdward L Talyn Dessert   12/25/2013, 10:10 AM

## 2013-12-25 NOTE — Progress Notes (Signed)
AVS reviewed with patient's daughter.  Educated on medications, physician follow-up and home health with [facility].  Verbalized understanding.  [facility]  Patient's daughter aware.  Teach back method used.  Patient's IV removed.  Site WNL.  Patient transported by NT via w/c to main entrance for discharge.  Patient stable at time of discharge.

## 2013-12-27 DIAGNOSIS — F0390 Unspecified dementia without behavioral disturbance: Secondary | ICD-10-CM | POA: Diagnosis not present

## 2013-12-27 DIAGNOSIS — N390 Urinary tract infection, site not specified: Secondary | ICD-10-CM | POA: Diagnosis not present

## 2013-12-27 DIAGNOSIS — I10 Essential (primary) hypertension: Secondary | ICD-10-CM | POA: Diagnosis not present

## 2013-12-27 DIAGNOSIS — F0280 Dementia in other diseases classified elsewhere without behavioral disturbance: Secondary | ICD-10-CM | POA: Diagnosis not present

## 2013-12-27 DIAGNOSIS — G309 Alzheimer's disease, unspecified: Secondary | ICD-10-CM | POA: Diagnosis not present

## 2013-12-27 DIAGNOSIS — F209 Schizophrenia, unspecified: Secondary | ICD-10-CM | POA: Diagnosis not present

## 2013-12-27 DIAGNOSIS — B9789 Other viral agents as the cause of diseases classified elsewhere: Secondary | ICD-10-CM | POA: Diagnosis not present

## 2013-12-27 NOTE — Progress Notes (Signed)
UR chart review completed.  

## 2013-12-28 DIAGNOSIS — G309 Alzheimer's disease, unspecified: Secondary | ICD-10-CM | POA: Diagnosis not present

## 2013-12-28 DIAGNOSIS — F209 Schizophrenia, unspecified: Secondary | ICD-10-CM | POA: Diagnosis not present

## 2013-12-28 DIAGNOSIS — F0280 Dementia in other diseases classified elsewhere without behavioral disturbance: Secondary | ICD-10-CM | POA: Diagnosis not present

## 2013-12-28 DIAGNOSIS — I10 Essential (primary) hypertension: Secondary | ICD-10-CM | POA: Diagnosis not present

## 2013-12-28 DIAGNOSIS — F0390 Unspecified dementia without behavioral disturbance: Secondary | ICD-10-CM | POA: Diagnosis not present

## 2013-12-28 DIAGNOSIS — N390 Urinary tract infection, site not specified: Secondary | ICD-10-CM | POA: Diagnosis not present

## 2013-12-28 DIAGNOSIS — B9789 Other viral agents as the cause of diseases classified elsewhere: Secondary | ICD-10-CM | POA: Diagnosis not present

## 2013-12-31 ENCOUNTER — Other Ambulatory Visit (HOSPITAL_COMMUNITY): Payer: Self-pay | Admitting: Pulmonary Disease

## 2013-12-31 ENCOUNTER — Ambulatory Visit (HOSPITAL_COMMUNITY)
Admission: RE | Admit: 2013-12-31 | Discharge: 2013-12-31 | Disposition: A | Discharge status: Home / Self Care | Payer: Medicare Other | Source: Ambulatory Visit | Attending: Pulmonary Disease | Admitting: Pulmonary Disease

## 2013-12-31 DIAGNOSIS — Z09 Encounter for follow-up examination after completed treatment for conditions other than malignant neoplasm: Secondary | ICD-10-CM | POA: Insufficient documentation

## 2013-12-31 DIAGNOSIS — J189 Pneumonia, unspecified organism: Secondary | ICD-10-CM | POA: Diagnosis not present

## 2013-12-31 DIAGNOSIS — R918 Other nonspecific abnormal finding of lung field: Secondary | ICD-10-CM | POA: Diagnosis not present

## 2014-01-03 DIAGNOSIS — N390 Urinary tract infection, site not specified: Secondary | ICD-10-CM | POA: Diagnosis not present

## 2014-01-03 DIAGNOSIS — K2100 Gastro-esophageal reflux disease with esophagitis, without bleeding: Secondary | ICD-10-CM | POA: Diagnosis not present

## 2014-01-03 DIAGNOSIS — F0390 Unspecified dementia without behavioral disturbance: Secondary | ICD-10-CM | POA: Diagnosis not present

## 2014-01-04 DIAGNOSIS — B9789 Other viral agents as the cause of diseases classified elsewhere: Secondary | ICD-10-CM | POA: Diagnosis not present

## 2014-01-04 DIAGNOSIS — F209 Schizophrenia, unspecified: Secondary | ICD-10-CM | POA: Diagnosis not present

## 2014-01-04 DIAGNOSIS — N390 Urinary tract infection, site not specified: Secondary | ICD-10-CM | POA: Diagnosis not present

## 2014-01-04 DIAGNOSIS — G309 Alzheimer's disease, unspecified: Secondary | ICD-10-CM | POA: Diagnosis not present

## 2014-01-04 DIAGNOSIS — F0390 Unspecified dementia without behavioral disturbance: Secondary | ICD-10-CM | POA: Diagnosis not present

## 2014-01-04 DIAGNOSIS — F0280 Dementia in other diseases classified elsewhere without behavioral disturbance: Secondary | ICD-10-CM | POA: Diagnosis not present

## 2014-01-04 DIAGNOSIS — I10 Essential (primary) hypertension: Secondary | ICD-10-CM | POA: Diagnosis not present

## 2014-01-05 DIAGNOSIS — F0280 Dementia in other diseases classified elsewhere without behavioral disturbance: Secondary | ICD-10-CM | POA: Diagnosis not present

## 2014-01-05 DIAGNOSIS — G309 Alzheimer's disease, unspecified: Secondary | ICD-10-CM | POA: Diagnosis not present

## 2014-01-05 DIAGNOSIS — F209 Schizophrenia, unspecified: Secondary | ICD-10-CM | POA: Diagnosis not present

## 2014-01-05 DIAGNOSIS — N390 Urinary tract infection, site not specified: Secondary | ICD-10-CM | POA: Diagnosis not present

## 2014-01-05 DIAGNOSIS — B9789 Other viral agents as the cause of diseases classified elsewhere: Secondary | ICD-10-CM | POA: Diagnosis not present

## 2014-01-05 DIAGNOSIS — I10 Essential (primary) hypertension: Secondary | ICD-10-CM | POA: Diagnosis not present

## 2014-01-05 DIAGNOSIS — F0390 Unspecified dementia without behavioral disturbance: Secondary | ICD-10-CM | POA: Diagnosis not present

## 2014-01-06 DIAGNOSIS — F0390 Unspecified dementia without behavioral disturbance: Secondary | ICD-10-CM | POA: Diagnosis not present

## 2014-01-06 DIAGNOSIS — G309 Alzheimer's disease, unspecified: Secondary | ICD-10-CM | POA: Diagnosis not present

## 2014-01-06 DIAGNOSIS — B9789 Other viral agents as the cause of diseases classified elsewhere: Secondary | ICD-10-CM | POA: Diagnosis not present

## 2014-01-06 DIAGNOSIS — I10 Essential (primary) hypertension: Secondary | ICD-10-CM | POA: Diagnosis not present

## 2014-01-06 DIAGNOSIS — N390 Urinary tract infection, site not specified: Secondary | ICD-10-CM | POA: Diagnosis not present

## 2014-01-06 DIAGNOSIS — F209 Schizophrenia, unspecified: Secondary | ICD-10-CM | POA: Diagnosis not present

## 2014-01-06 DIAGNOSIS — F0280 Dementia in other diseases classified elsewhere without behavioral disturbance: Secondary | ICD-10-CM | POA: Diagnosis not present

## 2014-01-08 DIAGNOSIS — F0280 Dementia in other diseases classified elsewhere without behavioral disturbance: Secondary | ICD-10-CM | POA: Diagnosis not present

## 2014-01-08 DIAGNOSIS — I10 Essential (primary) hypertension: Secondary | ICD-10-CM | POA: Diagnosis not present

## 2014-01-08 DIAGNOSIS — N390 Urinary tract infection, site not specified: Secondary | ICD-10-CM | POA: Diagnosis not present

## 2014-01-08 DIAGNOSIS — B9789 Other viral agents as the cause of diseases classified elsewhere: Secondary | ICD-10-CM | POA: Diagnosis not present

## 2014-01-08 DIAGNOSIS — F0390 Unspecified dementia without behavioral disturbance: Secondary | ICD-10-CM | POA: Diagnosis not present

## 2014-01-08 DIAGNOSIS — F209 Schizophrenia, unspecified: Secondary | ICD-10-CM | POA: Diagnosis not present

## 2014-01-08 DIAGNOSIS — G309 Alzheimer's disease, unspecified: Secondary | ICD-10-CM | POA: Diagnosis not present

## 2014-01-11 DIAGNOSIS — I739 Peripheral vascular disease, unspecified: Secondary | ICD-10-CM | POA: Diagnosis not present

## 2014-01-21 DIAGNOSIS — F200 Paranoid schizophrenia: Secondary | ICD-10-CM | POA: Diagnosis not present

## 2014-04-27 ENCOUNTER — Emergency Department (HOSPITAL_COMMUNITY)
Admission: EM | Admit: 2014-04-27 | Discharge: 2014-04-27 | Disposition: A | Discharge status: Home / Self Care | Payer: Medicare Other | Attending: Emergency Medicine | Admitting: Emergency Medicine

## 2014-04-27 ENCOUNTER — Encounter (HOSPITAL_COMMUNITY): Payer: Self-pay | Admitting: Emergency Medicine

## 2014-04-27 DIAGNOSIS — K219 Gastro-esophageal reflux disease without esophagitis: Secondary | ICD-10-CM | POA: Diagnosis not present

## 2014-04-27 DIAGNOSIS — Z79899 Other long term (current) drug therapy: Secondary | ICD-10-CM | POA: Diagnosis not present

## 2014-04-27 DIAGNOSIS — Z8619 Personal history of other infectious and parasitic diseases: Secondary | ICD-10-CM | POA: Diagnosis not present

## 2014-04-27 DIAGNOSIS — I10 Essential (primary) hypertension: Secondary | ICD-10-CM | POA: Insufficient documentation

## 2014-04-27 DIAGNOSIS — G309 Alzheimer's disease, unspecified: Secondary | ICD-10-CM | POA: Insufficient documentation

## 2014-04-27 DIAGNOSIS — F0280 Dementia in other diseases classified elsewhere without behavioral disturbance: Secondary | ICD-10-CM | POA: Insufficient documentation

## 2014-04-27 DIAGNOSIS — Z8781 Personal history of (healed) traumatic fracture: Secondary | ICD-10-CM | POA: Diagnosis not present

## 2014-04-27 DIAGNOSIS — G8929 Other chronic pain: Secondary | ICD-10-CM | POA: Diagnosis not present

## 2014-04-27 DIAGNOSIS — F209 Schizophrenia, unspecified: Secondary | ICD-10-CM | POA: Diagnosis not present

## 2014-04-27 NOTE — ED Notes (Signed)
Patients family verbalize understanding of discharge instructions. Patient out of department at this time by wheelchair, escorted by family.

## 2014-04-27 NOTE — ED Notes (Signed)
Her blood pressure is up real high per family. I called her MD and told him that her pressure was up, then it came back down. Now it is back up tonight. They called her pharmacy and ordered Lisinopril and they pharmacy told us to bring her in.

## 2014-04-27 NOTE — Discharge Instructions (Signed)
Continue the blood pressure medication. Recheck if she seems worse in any way.

## 2014-04-28 NOTE — ED Provider Notes (Signed)
CSN: [id number]     Arrival date & time 04/27/14  2146 History   First MD Initiated Contact with Patient 04/27/14 2216     Chief Complaint  Patient presents with  . Hypertension    (Consider location/radiation/quality/duration/timing/severity/associated sxs/prior Treatment)  Level V caveat for dementia  HPI Daughter states her mother has been her usual baseline self. She states her mother was on blood pressure medication however they stopped her medication in  April when she was admitted for fluid overload and hyponatremia. She states her blood pressure has been fine until today when her aide happened to check her blood pressure it was 205/105. When they rechecked it it had gone down to 164 systolic. At 8 PM this evening however is back up to 213/103. She called her PCP who called in her blood pressure medication she had been on before. She took her lisinopril at 8 PM. Daughter states her mother did not appear to be in any pain or have any noticeable change.  PCP Dr Juanetta Gosling  Past Medical History  Diagnosis Date  . Hypertension   . Reflux   . Schizophrenia   . Alzheimer disease   . Chronic back pain   . Clostridium difficile colitis   . Hiatal hernia   . Vertebral compression fracture    Past Surgical History  Procedure Laterality Date  . Carotid stent    . Tumor of lung -benign , and removed.    . Carotid endarterectomy    . Lung removal, partial     No family history on file. History  Substance Use Topics  . Smoking status: Never Smoker   . Smokeless tobacco: Not on file  . Alcohol Use: No   Lives with daughter   OB History   Grav Para Term Preterm Abortions TAB SAB Ect Mult Living                 Review of Systems  Unable to perform ROS: Dementia      Allergies  Codeine  Home Medications   Prior to Admission medications   Medication Sig Start Date End Date Taking? Authorizing Provider  pantoprazole sodium (PROTONIX) 40 mg/20 mL PACK Take 20 mg by mouth  2 (two) times daily.   Yes Historical Provider, MD  haloperidol (HALDOL) 0.5 MG tablet Take 0.5 mg by mouth daily. 04/06/14   Historical Provider, MD  lisinopril (PRINIVIL,ZESTRIL) 20 MG tablet Take 20 mg by mouth daily. 04/27/14   Historical Provider, MD  PRIMSOL 50 MG/5ML SOLN Take 5 mLs by mouth 3 (three) times daily. 04/11/14   Historical Provider, MD   BP 152/93  Pulse 76  Temp(Src) 99.6 F (37.6 C) (Oral)  Resp 16  Ht 5' (1.524 m)  Wt 117 lb (53.071 kg)  BMI 22.85 kg/m2  SpO2 99%  Vital signs normal except for a mild hypertension  Physical Exam  Nursing note and vitals reviewed. Constitutional: She appears well-developed and well-nourished.  Non-toxic appearance. She does not appear ill. No distress.  Patient sleeping  HENT:  Head: Normocephalic and atraumatic.  Right Ear: External ear normal.  Left Ear: External ear normal.  Nose: Nose normal. No mucosal edema or rhinorrhea.  Mouth/Throat: Oropharynx is clear and moist and mucous membranes are normal. No dental abscesses or uvula swelling.  Eyes: Conjunctivae and EOM are normal. Pupils are equal, round, and reactive to light.  Neck: Normal range of motion and full passive range of motion without pain. Neck supple.  Cardiovascular: Normal rate,  regular rhythm and normal heart sounds.  Exam reveals no gallop and no friction rub.   No murmur heard. Pulmonary/Chest: Effort normal and breath sounds normal. No respiratory distress. She has no wheezes. She has no rhonchi. She has no rales. She exhibits no tenderness and no crepitus.  Abdominal: Soft. Normal appearance and bowel sounds are normal. She exhibits no distension. There is no tenderness. There is no rebound and no guarding.  Musculoskeletal: Normal range of motion. She exhibits no edema and no tenderness.  Moves all extremities well.   Neurological: She has normal strength.  Pt moves her extremities and appears to be equal strength  Skin: Skin is warm, dry and intact. No  rash noted. No erythema. No pallor.  Psychiatric: Her speech is normal. Her mood appears not anxious.  Flat affect    ED Course  Procedures (including critical care time)  Review of her chart shows she was last admitted in April and her blood pressure medication was stopped at that time.  Pt already had her BP medication this evening.   Labs Review Labs Reviewed - No data to display  Imaging Review No results found.   EKG Interpretation   Date/Time:  Wednesday April 27 2014 22:25:51 EDT Ventricular Rate:  66 PR Interval:  246 QRS Duration: 105 QT Interval:  394 QTC Calculation: 413 R Axis:   68 Text Interpretation:  Sinus rhythm Prolonged PR interval Low voltage,  precordial leads Since last tracing rate slower (21 Dec 2013) Confirmed by  [facility]  MD-I, Rayaan Lorah ([id number]) on 04/27/2014 10:34:44 PM      MDM   Final diagnoses:  Essential hypertension    Plan discharge  Devoria AlbeIva Alexia Dinger, MD, Franz DellFACEP     Kenadi Miltner L Dorothea Yow, MD 04/28/14 0028

## 2014-06-09 ENCOUNTER — Emergency Department (HOSPITAL_COMMUNITY): Payer: Medicare Other

## 2014-06-09 ENCOUNTER — Inpatient Hospital Stay (HOSPITAL_COMMUNITY)
Admission: EM | Admit: 2014-06-09 | Discharge: 2014-06-12 | DRG: 689 | Disposition: A | Discharge status: Home / Self Care | Payer: Medicare Other | Attending: Internal Medicine | Admitting: Internal Medicine

## 2014-06-09 ENCOUNTER — Encounter (HOSPITAL_COMMUNITY): Payer: Self-pay | Admitting: Emergency Medicine

## 2014-06-09 DIAGNOSIS — B353 Tinea pedis: Secondary | ICD-10-CM | POA: Diagnosis present

## 2014-06-09 DIAGNOSIS — IMO0002 Reserved for concepts with insufficient information to code with codable children: Secondary | ICD-10-CM

## 2014-06-09 DIAGNOSIS — K625 Hemorrhage of anus and rectum: Secondary | ICD-10-CM

## 2014-06-09 DIAGNOSIS — F0390 Unspecified dementia without behavioral disturbance: Secondary | ICD-10-CM | POA: Diagnosis present

## 2014-06-09 DIAGNOSIS — E869 Volume depletion, unspecified: Secondary | ICD-10-CM

## 2014-06-09 DIAGNOSIS — M549 Dorsalgia, unspecified: Secondary | ICD-10-CM | POA: Diagnosis present

## 2014-06-09 DIAGNOSIS — R1310 Dysphagia, unspecified: Secondary | ICD-10-CM | POA: Diagnosis present

## 2014-06-09 DIAGNOSIS — R627 Adult failure to thrive: Secondary | ICD-10-CM | POA: Diagnosis present

## 2014-06-09 DIAGNOSIS — R569 Unspecified convulsions: Secondary | ICD-10-CM | POA: Diagnosis present

## 2014-06-09 DIAGNOSIS — R532 Functional quadriplegia: Secondary | ICD-10-CM | POA: Diagnosis present

## 2014-06-09 DIAGNOSIS — R64 Cachexia: Secondary | ICD-10-CM | POA: Diagnosis present

## 2014-06-09 DIAGNOSIS — R404 Transient alteration of awareness: Secondary | ICD-10-CM

## 2014-06-09 DIAGNOSIS — Z66 Do not resuscitate: Secondary | ICD-10-CM | POA: Diagnosis present

## 2014-06-09 DIAGNOSIS — B349 Viral infection, unspecified: Secondary | ICD-10-CM

## 2014-06-09 DIAGNOSIS — G8929 Other chronic pain: Secondary | ICD-10-CM | POA: Diagnosis present

## 2014-06-09 DIAGNOSIS — F0280 Dementia in other diseases classified elsewhere without behavioral disturbance: Secondary | ICD-10-CM | POA: Diagnosis present

## 2014-06-09 DIAGNOSIS — R259 Unspecified abnormal involuntary movements: Secondary | ICD-10-CM | POA: Diagnosis not present

## 2014-06-09 DIAGNOSIS — E871 Hypo-osmolality and hyponatremia: Secondary | ICD-10-CM

## 2014-06-09 DIAGNOSIS — K219 Gastro-esophageal reflux disease without esophagitis: Secondary | ICD-10-CM | POA: Diagnosis present

## 2014-06-09 DIAGNOSIS — F209 Schizophrenia, unspecified: Secondary | ICD-10-CM | POA: Diagnosis present

## 2014-06-09 DIAGNOSIS — I10 Essential (primary) hypertension: Secondary | ICD-10-CM | POA: Diagnosis not present

## 2014-06-09 DIAGNOSIS — R4182 Altered mental status, unspecified: Secondary | ICD-10-CM | POA: Diagnosis present

## 2014-06-09 DIAGNOSIS — G9340 Encephalopathy, unspecified: Secondary | ICD-10-CM | POA: Diagnosis present

## 2014-06-09 DIAGNOSIS — Z79899 Other long term (current) drug therapy: Secondary | ICD-10-CM | POA: Diagnosis not present

## 2014-06-09 DIAGNOSIS — N390 Urinary tract infection, site not specified: Principal | ICD-10-CM | POA: Diagnosis present

## 2014-06-09 DIAGNOSIS — G309 Alzheimer's disease, unspecified: Secondary | ICD-10-CM | POA: Diagnosis present

## 2014-06-09 DIAGNOSIS — K521 Toxic gastroenteritis and colitis: Secondary | ICD-10-CM

## 2014-06-09 LAB — URINE MICROSCOPIC-ADD ON

## 2014-06-09 LAB — CBC
HCT: 37.9 % (ref 36.0–46.0)
Hemoglobin: 12.8 g/dL (ref 12.0–15.0)
MCH: 30 pg (ref 26.0–34.0)
MCHC: 33.8 g/dL (ref 30.0–36.0)
MCV: 88.8 fL (ref 78.0–100.0)
Platelets: 199 10*3/uL (ref 150–400)
RBC: 4.27 MIL/uL (ref 3.87–5.11)
RDW: 12.4 % (ref 11.5–15.5)
WBC: 6.3 10*3/uL (ref 4.0–10.5)

## 2014-06-09 LAB — BASIC METABOLIC PANEL
Anion gap: 9 (ref 5–15)
BUN: 15 mg/dL (ref 6–23)
CHLORIDE: 98 meq/L (ref 96–112)
CO2: 28 mEq/L (ref 19–32)
CREATININE: 0.95 mg/dL (ref 0.50–1.10)
Calcium: 9.8 mg/dL (ref 8.4–10.5)
GFR calc Af Amer: 60 mL/min — ABNORMAL LOW (ref >90)
GFR calc non Af Amer: 52 mL/min — ABNORMAL LOW (ref >90)
Glucose, Bld: 112 mg/dL — ABNORMAL HIGH (ref 70–99)
Potassium: 4.7 mEq/L (ref 3.7–5.3)
Sodium: 135 mEq/L — ABNORMAL LOW (ref 137–147)

## 2014-06-09 LAB — HEPATIC FUNCTION PANEL
ALT: 12 U/L (ref 0–35)
AST: 21 U/L (ref 0–37)
Albumin: 3.5 g/dL (ref 3.5–5.2)
Alkaline Phosphatase: 77 U/L (ref 39–117)
BILIRUBIN TOTAL: 0.7 mg/dL (ref 0.3–1.2)
Total Protein: 6.6 g/dL (ref 6.0–8.3)

## 2014-06-09 LAB — URINALYSIS, ROUTINE W REFLEX MICROSCOPIC
Bilirubin Urine: NEGATIVE
Glucose, UA: NEGATIVE mg/dL
Ketones, ur: NEGATIVE mg/dL
Nitrite: NEGATIVE
PH: 7.5 (ref 5.0–8.0)
Protein, ur: NEGATIVE mg/dL
SPECIFIC GRAVITY, URINE: 1.004 — AB (ref 1.005–1.030)
Urobilinogen, UA: 0.2 mg/dL (ref 0.0–1.0)

## 2014-06-09 MED ORDER — CEFTRIAXONE SODIUM 1 G IJ SOLR
1.0000 g | Freq: Once | INTRAMUSCULAR | Status: AC
  Administered 2014-06-09: 1 g via INTRAVENOUS
  Filled 2014-06-09: qty 10

## 2014-06-09 MED ORDER — PANTOPRAZOLE SODIUM 40 MG PO PACK
20.0000 mg | PACK | Freq: Two times a day (BID) | ORAL | Status: DC
  Administered 2014-06-10 – 2014-06-12 (×4): 20 mg via ORAL
  Filled 2014-06-09 (×10): qty 20

## 2014-06-09 MED ORDER — ACETAMINOPHEN 160 MG/5ML PO SOLN
650.0000 mg | Freq: Once | ORAL | Status: AC
  Administered 2014-06-09: 650 mg via ORAL
  Filled 2014-06-09: qty 20.3

## 2014-06-09 MED ORDER — ACETAMINOPHEN 650 MG RE SUPP: 650.0000 mg | Freq: Four times a day (QID) | RECTAL | Status: DC | PRN

## 2014-06-09 MED ORDER — DEXTROSE 5 % IV SOLN
1.0000 g | INTRAVENOUS | Status: DC
  Administered 2014-06-10: 1 g via INTRAVENOUS
  Filled 2014-06-09 (×2): qty 10

## 2014-06-09 MED ORDER — ACETAMINOPHEN 325 MG PO TABS: 650.0000 mg | ORAL_TABLET | Freq: Once | ORAL | Status: DC

## 2014-06-09 MED ORDER — ENOXAPARIN SODIUM 40 MG/0.4ML ~~LOC~~ SOLN
40.0000 mg | Freq: Every day | SUBCUTANEOUS | Status: DC
  Administered 2014-06-10 – 2014-06-11 (×2): 40 mg via SUBCUTANEOUS
  Filled 2014-06-09 (×4): qty 0.4

## 2014-06-09 MED ORDER — SODIUM CHLORIDE 0.9 % IV BOLUS (SEPSIS)
1000.0000 mL | Freq: Once | INTRAVENOUS | Status: AC
  Administered 2014-06-09: 1000 mL via INTRAVENOUS

## 2014-06-09 MED ORDER — ACETAMINOPHEN 325 MG PO TABS: 650.0000 mg | ORAL_TABLET | Freq: Four times a day (QID) | ORAL | Status: DC | PRN

## 2014-06-09 MED ORDER — SODIUM CHLORIDE 0.9 % IV SOLN
INTRAVENOUS | Status: AC
  Administered 2014-06-10: 02:00:00 via INTRAVENOUS

## 2014-06-09 MED ORDER — ONDANSETRON HCL 4 MG PO TABS: 4.0000 mg | ORAL_TABLET | Freq: Four times a day (QID) | ORAL | Status: DC | PRN

## 2014-06-09 MED ORDER — ONDANSETRON HCL 4 MG/2ML IJ SOLN: 4.0000 mg | Freq: Four times a day (QID) | INTRAMUSCULAR | Status: DC | PRN

## 2014-06-09 NOTE — ED Notes (Signed)
Pt here with daughter/POA. sts that the pt's BP has been elevated lately, had taken her to [facility] for a follow up, BP was fine there so sent home. Reports that this has been a continuous problem. Called Cardiologist and told to double her BP meds, did that but BP is still elevated, took BP prior to coming and it was 188/84 and then 222/104. Today while caregiver with the pt - the pt was sitting in a chair when she suddenly raised her hands up then mouth fell open then head slumped and pt when unconscious for a second. Also want to have her checked out because she has been in a.fib for a few weeks and that is a new problem. Nad, skin warm and dry, resp e/u. Pt has hx of dementia.

## 2014-06-09 NOTE — ED Provider Notes (Signed)
CSN: [id number]     Arrival date & time 06/09/14  1600 History   First MD Initiated Contact with Patient 06/09/14 1708     Chief Complaint  Patient presents with  . Loss of Consciousness  . Hypertension     (Consider location/radiation/quality/duration/timing/severity/associated sxs/prior Treatment) HPI Comments: Pt cannot give hx d/t dementia Per duaghter/POA: Patient here today after "seizure" earlier today. Pt was sitting in chair when she threw her head back and then suddenly mouth fell over and she was unconscious. Only lasted a couple of seconds and quickly regained consciousness. Pt is unable to elaborate on these events. No head trauma. No hx of similar. No fevers, patient eating and acting normally. Pt not complaining of anything. Per daughter, patient's BP has been very labile lately. No cough, no other complaints. No hx of sz.    Past Medical History  Diagnosis Date  . Hypertension   . Reflux   . Schizophrenia   . Alzheimer disease   . Chronic back pain   . Clostridium difficile colitis   . Hiatal hernia   . Vertebral compression fracture    Past Surgical History  Procedure Laterality Date  . Carotid stent    . Tumor of lung -benign , and removed.    . Carotid endarterectomy    . Lung removal, partial     No family history on file. History  Substance Use Topics  . Smoking status: Never Smoker   . Smokeless tobacco: Not on file  . Alcohol Use: No   OB History   Grav Para Term Preterm Abortions TAB SAB Ect Mult Living                 Review of Systems  Unable to perform ROS: Dementia      Allergies  Codeine  Home Medications   Prior to Admission medications   Medication Sig Start Date End Date Taking? Authorizing Provider  ketoconazole (NIZORAL) 2 % cream Apply 1 application topically 2 (two) times daily as needed. Applies to both feet up to her ankles 06/06/14  Yes Historical Provider, MD  LISINOPRIL-HYDROCHLOROTHIAZIDE PO Take 5 mLs by mouth 2  (two) times daily.   Yes Historical Provider, MD  Multiple Vitamins-Minerals (MULTIVITAMIN & MINERAL PO) Take 15 mLs by mouth daily.   Yes Historical Provider, MD  pantoprazole sodium (PROTONIX) 40 mg/20 mL PACK Take 20 mg by mouth 2 (two) times daily.   Yes Historical Provider, MD  PRIMSOL 50 MG/5ML SOLN Take 10 mLs by mouth at bedtime.  04/11/14  Yes Historical Provider, MD   BP 133/79  Pulse 67  Temp(Src) 100.4 F (38 C) (Oral)  Resp 20  Ht  (1.549 m)  Wt 117 lb (53.071 kg)  BMI 22.12 kg/m2  SpO2 97% Physical Exam  Constitutional: She appears well-developed and well-nourished. No distress.  Elderly, NAD  HENT:  Head: Normocephalic and atraumatic.  Mouth/Throat: Oropharynx is clear and moist. No oropharyngeal exudate.  Eyes: Conjunctivae and EOM are normal. Pupils are equal, round, and reactive to light. Right eye exhibits no discharge. Left eye exhibits no discharge. No scleral icterus.  Neck: Normal range of motion. Neck supple.  Cardiovascular: Normal heart sounds.   No murmur heard. Patient HR speeds up and slows down, hovering at 110  Pulmonary/Chest: Effort normal and breath sounds normal. No respiratory distress. She has no wheezes. She has no rales.  Abdominal: Soft. She exhibits no distension and no mass. There is no tenderness.  Neurological: She  is alert. She exhibits normal muscle tone. Coordination normal.  Skin: Skin is warm. No rash noted. She is not diaphoretic.    ED Course  Procedures (including critical care time) Labs Review Labs Reviewed  BASIC METABOLIC PANEL - Abnormal; Notable for the following:    Sodium 135 (*)    Glucose, Bld 112 (*)    GFR calc non Af Amer 52 (*)    GFR calc Af Amer 60 (*)    All other components within normal limits  URINALYSIS, ROUTINE W REFLEX MICROSCOPIC - Abnormal; Notable for the following:    Specific Gravity, Urine 1.004 (*)    Hgb urine dipstick TRACE (*)    Leukocytes, UA MODERATE (*)    All other components  within normal limits  URINE CULTURE  CBC  HEPATIC FUNCTION PANEL  URINE MICROSCOPIC-ADD ON    Imaging Review Ct Head Wo Contrast  06/09/2014   CLINICAL DATA:  Altered mental status, elevated blood pressure.  EXAM: CT HEAD WITHOUT CONTRAST  TECHNIQUE: Contiguous axial images were obtained from the base of the skull through the vertex without intravenous contrast.  COMPARISON:  Brain CT 02/15/2013  FINDINGS: Periventricular and subcortical white matter hypodensity compatible with chronic small vessel ischemic change. Ventricles and sulci are appropriate for patient's age. No evidence for acute cortically based infarct, intracranial hemorrhage, mass-effect mass lesion. Bilateral cataract surgery. Paranasal sinuses mastoid air cells are unremarkable.  IMPRESSION: No acute intracranial process.   Electronically Signed   By: Annia Belt M.D.   On: 06/09/2014 19:57   Dg Chest Portable 1 View  06/09/2014   CLINICAL DATA:  Loss of consciousness, hypertension  EXAM: PORTABLE CHEST - 1 VIEW  COMPARISON:  Prior radiograph from 12/31/2013  FINDINGS: Cardiac and mediastinal silhouettes are within normal limits.  Lungs are hypoinflated. There is minimal left basilar atelectasis. No focal infiltrate, pulmonary edema, or pleural effusion. No definite pneumothorax, although the patient's chin overlies the lung apices.  No acute osseus abnormality.  IMPRESSION: Shallow lung inflation with no acute cardiopulmonary abnormality identified.   Electronically Signed   By: Rise Mu M.D.   On: 06/09/2014 18:18     EKG Interpretation   Date/Time:  Thursday June 09 2014 16:26:01 EDT Ventricular Rate:  69 PR Interval:  200 QRS Duration: 92 QT Interval:  378 QTC Calculation: 405 R Axis:   70 Text Interpretation:  Sinus rhythm with Premature supraventricular  complexes and with occasional Premature ventricular complexes Otherwise  normal ECG No significant change since last tracing Confirmed by YAO  MD,   DAVID ([id number]) on 06/09/2014 5:34:38 PM      MDM   MDM:  80 y.o. WM w/ PMHx of HTN, Dementia, w/ cc: of "seizure." Pt demented, cannot get full hx. Daughter/POA at bedside. Daughter states today she had episode for a few seconds where she nodded off. Not c/w seizure as no post ictal, no hx of seizure. No trauma to head. Did not fully lose consciousness, did not fall over. Daughter also complains about labile BPs that her PCP has been monitoring. Pt with no complaints. Here, tachy, febrile. Rest of exam as above. Will check labs, EKG, urine, give IVF. IVF with normalization of HR. UA ? Infected. Hx of UTIs. Pt poor historian. Offered to daughter wheter to watch and wait on culture or to bring in and they'd rather admit. Will treat with Rocephin and Admit.  Final diagnoses:  UTI (lower urinary tract infection)    Admit to Hospitalist  Pilar Jarvis, MD 06/09/14 2153

## 2014-06-09 NOTE — Consult Note (Signed)
NEURO HOSPITALIST CONSULT NOTE    Reason for Consult: transient episode of alteration of consciousness with shakiness, concern for seizure.  HPI:                                                                                                                                          [name] is an 80 y.o. female with a past medical history that is relevant for advanced AD, hypertension, schizophrenia, and recurrent urinary tract infections, brought in by family for further evaluation of the above stated complains. Patient has advanced dementia and can not contribute to her medical history, thus all clinical data is obtained from her daughter who is at the bedside and tells me around 2 pm today she witnessed an episode in which her mother was with her caregiver sitting in a chair and suddenly raised her hands up, had shaking arm movements, then mouth fell open then head slumped and pt when unconscious for fee seconds seconds and came back to her baseline. Her daughter said that her mother had a similar episode several years ago and at that time she was found to have severe carotid stenosis requiring stenting of the right ICA. Furthermore, her daughter said that her mother BP has been elevated lately, and when she took her BP after the episode it was above 200. Denies recent falls but per daughter patient has not had any fever but was found to have low-grade temperature in the emergency department. CT brain showed no acute intracranial abnormality. Unable to have MRI due to wires in her lungs that are not MRI compatible.  Past Medical History  Diagnosis Date  . Hypertension   . Reflux   . Schizophrenia   . Alzheimer disease   . Chronic back pain   . Clostridium difficile colitis   . Hiatal hernia   . Vertebral compression fracture     Past Surgical History  Procedure Laterality Date  . Carotid stent    . Tumor of lung -benign , and removed.    . Carotid  endarterectomy    . Lung removal, partial      Family History  Problem Relation Age of Onset  . Family history unknown: Yes    Social History:  reports that she has never smoked. She does not have any smokeless tobacco history on file. She reports that she does not drink alcohol or use illicit drugs.  Allergies  Allergen Reactions  . Codeine Other (See Comments)    UNKNOWN REACTION    MEDICATIONS:  I have reviewed the patient's current medications.   ROS: unable to obtain due to mental status.                                                                                                                           History obtained from chart review and daughter    Physical exam: pleasant female in no apparent distress. Blood pressure 148/74, pulse 69, temperature 98.8 F (37.1 C), temperature source Oral, resp. rate 20, height 5' 1"  (1.549 m), weight 53.071 kg (117 lb), SpO2 96.00%. Head: normocephalic. Neck: supple, no bruits, no JVD. Cardiac: no murmurs. Lungs: clear. Abdomen: soft, no tender, no mass. Extremities: no edema. Neurologic Examination:                                                                                                      General: Mental Status: Alert and awake but unable to follow commands. Cranial Nerves: II: Discs flat bilaterally; Visual fields grossly normal, pupils equal, round, reactive to light III,IV, VI: ptosis not present, extra-ocular motions intact bilaterally V,VII: smile symmetric, facial light touch sensation normal bilaterally VIII: hearing normal bilaterally IX,X: gag reflex present XI: bilateral shoulder shrug no tested XII: midline tongue extension without atrophy or fasciculations Motor: Seems to move all limbs symmetrically Sensory: no tested Deep Tendon Reflexes:  1+ all over Plantars: Right:  downgoing   Left: downgoing Cerebellar: No tested as patient can not follow instructions Gait:  Unable to test  No results found for this basename: cbc, bmp, coags, chol, tri, ldl, hga1c    Results for orders placed during the hospital encounter of 06/09/14 (from the past 48 hour(s))  CBC     Status: None   Collection Time    06/09/14  6:39 PM      Result Value Ref Range   WBC 6.3  4.0 - 10.5 K/uL   RBC 4.27  3.87 - 5.11 MIL/uL   Hemoglobin 12.8  12.0 - 15.0 g/dL   HCT 37.9  36.0 - 46.0 %   MCV 88.8  78.0 - 100.0 fL   MCH 30.0  26.0 - 34.0 pg   MCHC 33.8  30.0 - 36.0 g/dL   RDW 12.4  11.5 - 15.5 %   Platelets 199  150 - 400 K/uL  BASIC METABOLIC PANEL     Status: Abnormal   Collection Time    06/09/14  6:39 PM      Result Value Ref Range   Sodium 135 (*) 137 - 147 mEq/L   Potassium 4.7  3.7 - 5.3 mEq/L   Chloride 98  96 - 112 mEq/L   CO2 28  19 - 32 mEq/L   Glucose, Bld 112 (*) 70 - 99 mg/dL   BUN 15  6 - 23 mg/dL   Creatinine, Ser 0.95  0.50 - 1.10 mg/dL   Calcium 9.8  8.4 - 10.5 mg/dL   GFR calc non Af Amer 52 (*) >90 mL/min   GFR calc Af Amer 60 (*) >90 mL/min   Comment: (NOTE)     The eGFR has been calculated using the CKD EPI equation.     This calculation has not been validated in all clinical situations.     eGFR's persistently <90 mL/min signify possible Chronic Kidney     Disease.   Anion gap 9  5 - 15  HEPATIC FUNCTION PANEL     Status: None   Collection Time    06/09/14  6:39 PM      Result Value Ref Range   Total Protein 6.6  6.0 - 8.3 g/dL   Albumin 3.5  3.5 - 5.2 g/dL   AST 21  0 - 37 U/L   ALT 12  0 - 35 U/L   Alkaline Phosphatase 77  39 - 117 U/L   Total Bilirubin 0.7  0.3 - 1.2 mg/dL   Bilirubin, Direct <0.2  0.0 - 0.3 mg/dL   Indirect Bilirubin NOT CALCULATED  0.3 - 0.9 mg/dL  URINALYSIS, ROUTINE W REFLEX MICROSCOPIC     Status: Abnormal   Collection Time    06/09/14  7:05 PM      Result Value Ref Range   Color, Urine YELLOW  YELLOW    APPearance CLEAR  CLEAR   Specific Gravity, Urine 1.004 (*) 1.005 - 1.030   pH 7.5  5.0 - 8.0   Glucose, UA NEGATIVE  NEGATIVE mg/dL   Hgb urine dipstick TRACE (*) NEGATIVE   Bilirubin Urine NEGATIVE  NEGATIVE   Ketones, ur NEGATIVE  NEGATIVE mg/dL   Protein, ur NEGATIVE  NEGATIVE mg/dL   Urobilinogen, UA 0.2  0.0 - 1.0 mg/dL   Nitrite NEGATIVE  NEGATIVE   Leukocytes, UA MODERATE (*) NEGATIVE  URINE MICROSCOPIC-ADD ON     Status: None   Collection Time    06/09/14  7:05 PM      Result Value Ref Range   Squamous Epithelial / LPF RARE  RARE   WBC, UA 3-6  <3 WBC/hpf   RBC / HPF 0-2  <3 RBC/hpf   Bacteria, UA RARE  RARE    Ct Head Wo Contrast  06/09/2014   CLINICAL DATA:  Altered mental status, elevated blood pressure.  EXAM: CT HEAD WITHOUT CONTRAST  TECHNIQUE: Contiguous axial images were obtained from the base of the skull through the vertex without intravenous contrast.  COMPARISON:  Brain CT 02/15/2013  FINDINGS: Periventricular and subcortical white matter hypodensity compatible with chronic small vessel ischemic change. Ventricles and sulci are appropriate for patient's age. No evidence for acute cortically based infarct, intracranial hemorrhage, mass-effect mass lesion. Bilateral cataract surgery. Paranasal sinuses mastoid air cells are unremarkable.  IMPRESSION: No acute intracranial process.   Electronically Signed   By: Lovey Newcomer M.D.   On: 06/09/2014 19:57   Dg Chest Portable 1 View  06/09/2014   CLINICAL DATA:  Loss of consciousness, hypertension  EXAM: PORTABLE CHEST - 1 VIEW  COMPARISON:  Prior radiograph from 12/31/2013  FINDINGS: Cardiac and mediastinal silhouettes are within normal limits.  Lungs are hypoinflated.  There is minimal left basilar atelectasis. No focal infiltrate, pulmonary edema, or pleural effusion. No definite pneumothorax, although the patient's chin overlies the lung apices.  No acute osseus abnormality.  IMPRESSION: Shallow lung inflation with no acute  cardiopulmonary abnormality identified.   Electronically Signed   By: Jeannine Boga M.D.   On: 06/09/2014 18:18        Assessment/Plan: 80 y.o. female with a past medical history that is relevant for advanced AD, brought in after sustaining a very short lived episode of alteration of consciousness with arm shakiness. CT brain and serologies are unimpressive. Although AD patients are prone to seizures, the brevity of the episode with rapid return to baseline makes me wonder if she really had a seizure. Her neuro-exam is non focal. Will be in favor of obtaining EEG in am and making further decisions accordingly. She can not have MRI due to wires in her lungs that are not MRI compatible. Will follow up.   Dorian Pod, MD 06/09/2014, 11:11 PM [facility]

## 2014-06-09 NOTE — H&P (Signed)
Patient's PCP: Fredirick Maudlin, MD  Chief Complaint: Altered mental status  History of Present Illness: [name] is a 79 y.o. Caucasian female with history of advanced Alzheimer's dementia, hypertension, schizophrenia, and recurrent urinary tract infections who presents with the above complaints.  Most of the history was obtained by daughter at bedside.  Patient due to dementia cannot provide any history.  Daughter indicated that over the last few months patient has had labile blood pressure with blood pressures in the 180s, 190s, 200s at times followed by rapid improvement in blood pressures.  Her blood pressure has been managed by her primary care physician.  Today at 2 p.m., patient had a sudden episode where she had her head fall back and had shaking-like activity which lasted less than 10 seconds.  Daughter checked her blood pressure and it was elevated in the 200s systolic.  Of note, about her reports that patient had a similar episode in 2007 where she had the headache and had her head fall back with eyes rolled.  Eventually testing showed she had left carotid stenosis 100% and required stent in her right carotid artery. In the emergency department, she was found to have urinary tract infection.  Hospitalist service was asked to admit the patient for further care and management.  Per daughter patient has not had any fever but was found to have low-grade temperature in the emergency department.  Has not had any chills, nausea, vomiting, chest pain, shortness of breath, abdominal pain, diarrhea, headaches or vision changes.  Review of Systems: Limited due to patient's dementia.  Past Medical History  Diagnosis Date  . Hypertension   . Reflux   . Schizophrenia   . Alzheimer disease   . Chronic back pain   . Clostridium difficile colitis   . Hiatal hernia   . Vertebral compression fracture    Past Surgical History  Procedure Laterality Date  . Carotid stent    . Tumor of lung  -benign , and removed.    . Carotid endarterectomy    . Lung removal, partial     Family History  Problem Relation Age of Onset  . Family history unknown: Yes   Family history: Could not be obtained due to patient's dementia.  History   Social History  . Marital Status: Divorced    Spouse Name: N/A    Number of Children: N/A  . Years of Education: N/A   Occupational History  . Not on file.   Social History Main Topics  . Smoking status: Never Smoker   . Smokeless tobacco: Not on file  . Alcohol Use: No  . Drug Use: No  . Sexual Activity: Not on file   Other Topics Concern  . Not on file   Social History Narrative  . No narrative on file   Allergies: Codeine  Home Meds: Prior to Admission medications   Medication Sig Start Date End Date Taking? Authorizing Provider  ketoconazole (NIZORAL) 2 % cream Apply 1 application topically 2 (two) times daily as needed. Applies to both feet up to her ankles 06/06/14  Yes Historical Provider, MD  LISINOPRIL-HYDROCHLOROTHIAZIDE PO Take 5 mLs by mouth 2 (two) times daily.   Yes Historical Provider, MD  Multiple Vitamins-Minerals (MULTIVITAMIN & MINERAL PO) Take 15 mLs by mouth daily.   Yes Historical Provider, MD  pantoprazole sodium (PROTONIX) 40 mg/20 mL PACK Take 20 mg by mouth 2 (two) times daily.   Yes Historical Provider, MD  PRIMSOL 50 MG/5ML SOLN Take  10 mLs by mouth at bedtime.  04/11/14  Yes Historical Provider, MD    Physical Exam: Blood pressure 141/69, pulse 71, temperature 100.4 F (38 C), temperature source Oral, resp. rate 20, height  (1.549 m), weight 53.071 kg (117 lb), SpO2 98.00%. General: Awake, not oriented x3, No acute distress. HEENT: EOMI, Moist mucous membranes Neck: Supple CV: S1 and S2 Lungs: Clear to ascultation bilaterally Abdomen: Soft, Nontender, Nondistended, +bowel sounds. Ext: Good pulses. Trace edema. No clubbing or cyanosis noted. Neuro: Cranial Nerves could not be assessed due to  patient's compliance.  Patient moving all of her extremities spontaneously.   Lab results:  Recent Labs  06/09/14 1839  NA 135*  K 4.7  CL 98  CO2 28  GLUCOSE 112*  BUN 15  CREATININE 0.95  CALCIUM 9.8    Recent Labs  06/09/14 1839  AST 21  ALT 12  ALKPHOS 77  BILITOT 0.7  PROT 6.6  ALBUMIN 3.5   No results found for this basename: LIPASE, AMYLASE,  in the last 72 hours  Recent Labs  06/09/14 1839  WBC 6.3  HGB 12.8  HCT 37.9  MCV 88.8  PLT 199   No results found for this basename: CKTOTAL, CKMB, CKMBINDEX, TROPONINI,  in the last 72 hours No components found with this basename: POCBNP,  No results found for this basename: DDIMER,  in the last 72 hours No results found for this basename: HGBA1C,  in the last 72 hours No results found for this basename: CHOL, HDL, LDLCALC, TRIG, CHOLHDL, LDLDIRECT,  in the last 72 hours No results found for this basename: TSH, T4TOTAL, FREET3, T3FREE, THYROIDAB,  in the last 72 hours No results found for this basename: VITAMINB12, FOLATE, FERRITIN, TIBC, IRON, RETICCTPCT,  in the last 72 hours Imaging results:  Ct Head Wo Contrast  06/09/2014   CLINICAL DATA:  Altered mental status, elevated blood pressure.  EXAM: CT HEAD WITHOUT CONTRAST  TECHNIQUE: Contiguous axial images were obtained from the base of the skull through the vertex without intravenous contrast.  COMPARISON:  Brain CT 02/15/2013  FINDINGS: Periventricular and subcortical white matter hypodensity compatible with chronic small vessel ischemic change. Ventricles and sulci are appropriate for patient's age. No evidence for acute cortically based infarct, intracranial hemorrhage, mass-effect mass lesion. Bilateral cataract surgery. Paranasal sinuses mastoid air cells are unremarkable.  IMPRESSION: No acute intracranial process.   Electronically Signed   By: Annia Belt M.D.   On: 06/09/2014 19:57   Dg Chest Portable 1 View  06/09/2014   CLINICAL DATA:  Loss of  consciousness, hypertension  EXAM: PORTABLE CHEST - 1 VIEW  COMPARISON:  Prior radiograph from 12/31/2013  FINDINGS: Cardiac and mediastinal silhouettes are within normal limits.  Lungs are hypoinflated. There is minimal left basilar atelectasis. No focal infiltrate, pulmonary edema, or pleural effusion. No definite pneumothorax, although the patient's chin overlies the lung apices.  No acute osseus abnormality.  IMPRESSION: Shallow lung inflation with no acute cardiopulmonary abnormality identified.   Electronically Signed   By: Rise Mu M.D.   On: 06/09/2014 18:18   Other results: EKG: Sinus rhythm with PVCs.  Assessment & Plan by Problem: Altered mental status/acute encephalopathy Complicated by patient's dementia.  Likely due to urinary tract infection.  Head CT is negative period. Her shaking episode is of unclear etiology, requested neurology evaluation. Appreciate neurology input.  Urinary tract infection Continue ceftriaxone.  Further management of antibiotics depending on culture results and sensitivities.  Labile hypertension Continue  to monitor blood pressure.  Stable in the emergency department.  Resume home antihypertensive medications once verified by pharmacy.  Patient is on lisinopril/hydrochlorothiazide, dose unknown.  Instructed the daughter that she should bring her blood pressure medication from home and have it compared to our blood pressure machine.  Advanced dementia Chronic and stable.  Schizophrenia Chronic and stable.  Complicated by dementia.  Prophylaxis Lovenox.  CODE STATUS DO NOT RESUSCITATE/DO NOT INTUBATE.  This was discussed with the daughter health care power of attorney at the time of admission.  Disposition Admit the patient to medical bed as inpatient.  Time spent on admission, talking to the patient, and coordinating care was: 50 mins.  Shannan Slinker A, MD 06/09/2014, 10:12 PM

## 2014-06-10 ENCOUNTER — Inpatient Hospital Stay (HOSPITAL_COMMUNITY): Payer: Medicare Other

## 2014-06-10 DIAGNOSIS — E871 Hypo-osmolality and hyponatremia: Secondary | ICD-10-CM

## 2014-06-10 LAB — CBC
HCT: 36.2 % (ref 36.0–46.0)
Hemoglobin: 12.1 g/dL (ref 12.0–15.0)
MCH: 29.7 pg (ref 26.0–34.0)
MCHC: 33.4 g/dL (ref 30.0–36.0)
MCV: 88.9 fL (ref 78.0–100.0)
Platelets: 190 10*3/uL (ref 150–400)
RBC: 4.07 MIL/uL (ref 3.87–5.11)
RDW: 12.3 % (ref 11.5–15.5)
WBC: 4.7 10*3/uL (ref 4.0–10.5)

## 2014-06-10 LAB — BASIC METABOLIC PANEL
Anion gap: 8 (ref 5–15)
BUN: 13 mg/dL (ref 6–23)
CHLORIDE: 105 meq/L (ref 96–112)
CO2: 28 mEq/L (ref 19–32)
CREATININE: 0.93 mg/dL (ref 0.50–1.10)
Calcium: 9.3 mg/dL (ref 8.4–10.5)
GFR calc non Af Amer: 53 mL/min — ABNORMAL LOW (ref >90)
GFR, EST AFRICAN AMERICAN: 62 mL/min — AB (ref >90)
GLUCOSE: 87 mg/dL (ref 70–99)
POTASSIUM: 4.1 meq/L (ref 3.7–5.3)
Sodium: 141 mEq/L (ref 137–147)

## 2014-06-10 LAB — URINE CULTURE
COLONY COUNT: NO GROWTH
CULTURE: NO GROWTH

## 2014-06-10 LAB — GLUCOSE, CAPILLARY: GLUCOSE-CAPILLARY: 84 mg/dL (ref 70–99)

## 2014-06-10 MED ORDER — CETYLPYRIDINIUM CHLORIDE 0.05 % MT LIQD
7.0000 mL | Freq: Two times a day (BID) | OROMUCOSAL | Status: DC
  Administered 2014-06-11 – 2014-06-12 (×3): 7 mL via OROMUCOSAL

## 2014-06-10 MED ORDER — SODIUM CHLORIDE 0.9 % IV SOLN
50.0000 mg | Freq: Two times a day (BID) | INTRAVENOUS | Status: DC
  Administered 2014-06-10 – 2014-06-12 (×4): 50 mg via INTRAVENOUS
  Filled 2014-06-10 (×8): qty 5

## 2014-06-10 NOTE — Progress Notes (Signed)
EEG Completed; Results Pending  

## 2014-06-10 NOTE — Consult Note (Signed)
CARDIOLOGY CONSULT NOTE   Patient ID: MADESYN AST MRN: [id number], DOB/AGE: 1924/07/15   Admit date: 06/09/2014 Date of Consult: 06/10/2014   Primary Physician: Fredirick Maudlin, MD Primary Cardiologist: Unknown, new to [facility], [place], POA/Daughter not in room at time of consult  Pt. Profile  80 year old Caucasian female with past medical history significant for hypertension, schizophrenia, recurrent UTI, advanced Alzheimer's dementia and history of carotid artery disease s/p R carotid stent presented with syncope  Problem List  Past Medical History  Diagnosis Date  . Hypertension   . Reflux   . Schizophrenia   . Alzheimer disease   . Chronic back pain   . Clostridium difficile colitis   . Hiatal hernia   . Vertebral compression fracture     Past Surgical History  Procedure Laterality Date  . Carotid stent    . Tumor of lung -benign , and removed.    . Carotid endarterectomy    . Lung removal, partial       Allergies  Allergies  Allergen Reactions  . Codeine Other (See Comments)    UNKNOWN REACTION    HPI   The patient is an 80 year old Caucasian female with past medical history significant for hypertension, schizophrenia, recurrent UTI, advanced Alzheimer's dementia and history of carotid artery disease s/p R carotid stent. It is unclear as this time who is the patient's cardiologist. Although previous records indicate her pressure was managed by a cardiologist, there has been no name documented in our system. It appears the patient was previously on lisinopril and hydrochlorothiazide combination. She was previously admitted in April 2015 for hypervolemic hyponatremia and her blood pressure med was discontinued at that time. It is unclear at this time whether or not the patient is still taking the medication as no family member were present during the time of interview and information could not be obtained from the patient due to advanced dementia. According to our  records, it seems the patient has been having labile blood pressure ranged from normal up to 180s to 200s.   She has been doing well until 06/09/2014 around 2 PM. At that time, patient raised her arm and started shaking. Her head fall back and eyes rolled back as well. Patient was out for less than 10 seconds before regaining consciousness. Patient's daughter checked her blood pressure and it was elevated into 200s systolic. She was subsequently transferred to [facility] for further evaluation.  On arrival to [facility], her sodium was 135, glucose 112, CBC normal, urinalysis showed rare bacteria with moderate leukocyte. Urine culture was obtained and is currently pending. Chest x-ray showed shallow lung inflation no acute cardiopulmonary abnormality. EKG showed normal sinus rhythm with heart rate 60s, borderline first degree heart block. CT of the brain showed no acute process. On arrival the patient's blood pressure was 145/53. Temperature was borderline febrile at 100.4. Given her history of recurrent UTI, she was started on ceftriaxone to prophylactically treat possible UTI.  Throughout her hospitalization, she has not been started on any of her previous blood pressure medication, and her blood pressure has been in the 120s to 140s range without intervention. Patient's daughter requested cardiology consultation for blood pressure management.    Inpatient Medications  . antiseptic oral rinse  7 mL Mouth Rinse BID  . cefTRIAXone (ROCEPHIN)  IV  1 g Intravenous Q24H  . enoxaparin (LOVENOX) injection  40 mg Subcutaneous QHS  . pantoprazole sodium  20 mg Oral BID    Family History Family  History  Problem Relation Age of Onset  . Family history unknown: Yes     Social History History   Social History  . Marital Status: Divorced    Spouse Name: N/A    Number of Children: N/A  . Years of Education: N/A   Occupational History  . Not on file.   Social History Main Topics  . Smoking status:  Never Smoker   . Smokeless tobacco: Not on file  . Alcohol Use: No  . Drug Use: No  . Sexual Activity: Not on file   Other Topics Concern  . Not on file   Social History Narrative  . No narrative on file     Review of Systems Review of system unable to obtain secondary to the patient's severe dementia and mental status. Patient's daughter, Power of Gerrit Friends was not in the room   Physical Exam  Blood pressure 122/55, pulse 61, temperature 98.8 F (37.1 C), temperature source Oral, resp. rate 18, height  (1.549 m), weight 117 lb (53.071 kg), SpO2 96.00%.  General: dementia, alert but not oriented. Difficult to understand words Psych: flat Neuro: Alert. Moves all extremities spontaneously. HEENT: Normal  Neck: Normal appearance Lungs:  Resp regular and unlabored Heart: RRR.  Abdomen: Soft. Refused further testing. Extremities: No clubbing, cyanosis or edema.   Labs  No results found for this basename: CKTOTAL, CKMB, TROPONINI,  in the last 72 hours Lab Results  Component Value Date   WBC 4.7 06/10/2014   HGB 12.1 06/10/2014   HCT 36.2 06/10/2014   MCV 88.9 06/10/2014   PLT 190 06/10/2014    Recent Labs Lab 06/09/14 1839 06/10/14 0534  NA 135* 141  K 4.7 4.1  CL 98 105  CO2 28 28  BUN 15 13  CREATININE 0.95 0.93  CALCIUM 9.8 9.3  PROT 6.6  --   BILITOT 0.7  --   ALKPHOS 77  --   ALT 12  --   AST 21  --   GLUCOSE 112* 87    Radiology/Studies  Ct Head Wo Contrast  06/09/2014   CLINICAL DATA:  Altered mental status, elevated blood pressure.  EXAM: CT HEAD WITHOUT CONTRAST  TECHNIQUE: Contiguous axial images were obtained from the base of the skull through the vertex without intravenous contrast.  COMPARISON:  Brain CT 02/15/2013  FINDINGS: Periventricular and subcortical white matter hypodensity compatible with chronic small vessel ischemic change. Ventricles and sulci are appropriate for patient's age. No evidence for acute cortically based infarct,  intracranial hemorrhage, mass-effect mass lesion. Bilateral cataract surgery. Paranasal sinuses mastoid air cells are unremarkable.  IMPRESSION: No acute intracranial process.   Electronically Signed   By: Annia Belt M.D.   On: 06/09/2014 19:57   Dg Chest Portable 1 View  06/09/2014   CLINICAL DATA:  Loss of consciousness, hypertension  EXAM: PORTABLE CHEST - 1 VIEW  COMPARISON:  Prior radiograph from 12/31/2013  FINDINGS: Cardiac and mediastinal silhouettes are within normal limits.  Lungs are hypoinflated. There is minimal left basilar atelectasis. No focal infiltrate, pulmonary edema, or pleural effusion. No definite pneumothorax, although the patient's chin overlies the lung apices.  No acute osseus abnormality.  IMPRESSION: Shallow lung inflation with no acute cardiopulmonary abnormality identified.   Electronically Signed   By: Rise Mu M.D.   On: 06/09/2014 18:18    ECG  NSR with HR 60s, 1st degree heart block  ASSESSMENT AND PLAN  1. Labile Hypertension  - post syncope, her BP was  measured by daughter to be in the 200s  - unclear if related to neurological changes  - her SBP 120-140s since admitted yesterday, would not add BP medication at this time for this frail elderly female with advanced dementia and h/o carotid stenosis. Would allow her BP to be in the 140s without aggressive intervention given her frailty and mental status   - need orthostatic pressure, however it appears the episode happened while the pt was sitting.   2. AMS  - neurology onboard, not entirely sure if it is true seizure. Pending EEG  3. Schizophrenia 4. Recurrent UTI - treat per primary team 5. advanced Alzheimer's dementia 6. history of carotid artery disease s/p R carotid stent 7. ?h/o a-fib, documented per ED nursing note regarding recently diagnosed a-fib, however patient not on telemetry  - Previous EKG reviewed, no obvious a-fib  - need confirm with daughter if she truly have intermittent  a-fib and who is her cardiologist.  Ramond Dial, PA-C 06/10/2014, 2:16 PM  Patient seen and examined with Azalee Course, PA-C. We discussed all aspects of the encounter. I agree with the assessment and plan as stated above.   She has had one single elevated BP after her acute episode of confusion which may be related to UTI. Since that time BP has been in normal range and stable. Would continue to follow. Treat UTI. No other recommendations at this time. I have attempted to call her daughter several times with all numbers in computer and was unable to get an answer.   We have reviewed all ECGs and no evidence of AF. Patient not ideal candidate for anticoagulation even if AF were to be found.   We will sign off. Please call with questions.   Truman Hayward 3:51 PM

## 2014-06-10 NOTE — Procedures (Signed)
ELECTROENCEPHALOGRAM REPORT   Patient: [name]       Room #: 1O10 EEG No. ID: [id number] Age: 80 y.o.        Sex: female Referring Physician: Izola Price Report Date:  06/10/2014        Interpreting Physician: Thana Farr D  History: [name] is an 80 y.o. female with a seizure-like episode  Medications:  Scheduled: . antiseptic oral rinse  7 mL Mouth Rinse BID  . cefTRIAXone (ROCEPHIN)  IV  1 g Intravenous Q24H  . enoxaparin (LOVENOX) injection  40 mg Subcutaneous QHS  . pantoprazole sodium  20 mg Oral BID    Conditions of Recording:  This is a 16 channel EEG carried out with the patient in the drowsy state.  Description:  The background activity is slow and poorly organized and likely represents drowse as opposed to wakefulness.  The patient keeps her eyes closed throughout much of the tracing and does not follow commands.  No well organized posterior background rhythm is noted. The background rhythm consists of a low to moderate voltage mixture of theta and delta activity.   There are rare right frontal sharp transients noted.   There is no evidence of Stage II sleep. Hyperventilation and intermittent photic stimulation were not performed   IMPRESSION: This is an abnormal electroencephalogram secondary to rare right frontal sharp transients.     Thana Farr, MD [facility] [phone] 06/10/2014, 4:34 PM

## 2014-06-10 NOTE — Progress Notes (Signed)
Subjective: Patient interacts minimally during the examination.  No involuntary movement noted.  No further presenting episodes noted.    Objective: Current vital signs: BP 160/60  Pulse 89  Temp(Src) 98.6 F (37 C) (Oral)  Resp 20  Ht  (1.549 m)  Wt 53.071 kg (117 lb)  BMI 22.12 kg/m2  SpO2 97% Vital signs in last 24 hours: Temp:  [98.6 F (37 C)-98.8 F (37.1 C)] 98.6 F (37 C) (09/25 1600) Pulse Rate:  [61-89] 89 (09/25 1600) Resp:  [18-20] 20 (09/25 1600) BP: (122-160)/(55-83) 160/60 mmHg (09/25 1600) SpO2:  [96 %-98 %] 97 % (09/25 1600) Weight:  [53.071 kg (117 lb)] 53.071 kg (117 lb) (09/24 2229)  Intake/Output from previous day:   Intake/Output this shift:   Nutritional status: Dysphagia  Neurologic Exam: Mental Status:  Lays in bed with eyes closed.  Answers with one and two word answers,  Does not follow commands.   Cranial Nerves:  II: Discs flat bilaterally; Blinks to bilateral confrontation, pupils equal, round, reactive to light  III,IV, VI: ptosis not present, extra-ocular motions intact bilaterally  V,VII: grimace symmetric, facial light touch sensation normal bilaterally  VIII: hearing normal bilaterally  IX,X: gag reflex present  XI: bilateral shoulder shrug no tested  XII: midline tongue extension without atrophy or fasciculations  Motor:  Seems to move all limbs symmetrically  Deep Tendon Reflexes:  1+ throughout  Plantars:  Right: downgoing   Left: downgoing   Lab Results: Basic Metabolic Panel:  Recent Labs Lab 06/09/14 1839 06/10/14 0534  NA 135* 141  K 4.7 4.1  CL 98 105  CO2 28 28  GLUCOSE 112* 87  BUN 15 13  CREATININE 0.95 0.93  CALCIUM 9.8 9.3    Liver Function Tests:  Recent Labs Lab 06/09/14 1839  AST 21  ALT 12  ALKPHOS 77  BILITOT 0.7  PROT 6.6  ALBUMIN 3.5   No results found for this basename: LIPASE, AMYLASE,  in the last 168 hours No results found for this basename: AMMONIA,  in the last 168  hours  CBC:  Recent Labs Lab 06/09/14 1839 06/10/14 0534  WBC 6.3 4.7  HGB 12.8 12.1  HCT 37.9 36.2  MCV 88.8 88.9  PLT 199 190    Cardiac Enzymes: No results found for this basename: CKTOTAL, CKMB, CKMBINDEX, TROPONINI,  in the last 168 hours  Lipid Panel: No results found for this basename: CHOL, TRIG, HDL, CHOLHDL, VLDL, LDLCALC,  in the last 168 hours  CBG:  Recent Labs Lab 06/10/14 0737  GLUCAP 84    Microbiology: Results for orders placed during the hospital encounter of 12/18/13  URINE CULTURE     Status: None   Collection Time    12/18/13  7:50 PM      Result Value Ref Range Status   Specimen Description URINE, CATHETERIZED   Final   Special Requests NONE   Final   Culture  Setup Time     Final   Value: 12/20/2013 01:16     Performed at [facility] Count     Final   Value: NO GROWTH     Performed at [facility]   Culture     Final   Value: NO GROWTH     Performed at [facility]   Report Status 12/20/2013 FINAL   Final    Coagulation Studies: No results found for this basename: LABPROT, INR,  in the last 72 hours  Imaging: Ct Head Wo Contrast  06/09/2014   CLINICAL DATA:  Altered mental status, elevated blood pressure.  EXAM: CT HEAD WITHOUT CONTRAST  TECHNIQUE: Contiguous axial images were obtained from the base of the skull through the vertex without intravenous contrast.  COMPARISON:  Brain CT 02/15/2013  FINDINGS: Periventricular and subcortical white matter hypodensity compatible with chronic small vessel ischemic change. Ventricles and sulci are appropriate for patient's age. No evidence for acute cortically based infarct, intracranial hemorrhage, mass-effect mass lesion. Bilateral cataract surgery. Paranasal sinuses mastoid air cells are unremarkable.  IMPRESSION: No acute intracranial process.   Electronically Signed   By: Annia Belt M.D.   On: 06/09/2014 19:57   Dg Chest Portable 1 View  06/09/2014    CLINICAL DATA:  Loss of consciousness, hypertension  EXAM: PORTABLE CHEST - 1 VIEW  COMPARISON:  Prior radiograph from 12/31/2013  FINDINGS: Cardiac and mediastinal silhouettes are within normal limits.  Lungs are hypoinflated. There is minimal left basilar atelectasis. No focal infiltrate, pulmonary edema, or pleural effusion. No definite pneumothorax, although the patient's chin overlies the lung apices.  No acute osseus abnormality.  IMPRESSION: Shallow lung inflation with no acute cardiopulmonary abnormality identified.   Electronically Signed   By: Rise Mu M.D.   On: 06/09/2014 18:18    Medications:  I have reviewed the patient's current medications. Scheduled: . antiseptic oral rinse  7 mL Mouth Rinse BID  . cefTRIAXone (ROCEPHIN)  IV  1 g Intravenous Q24H  . enoxaparin (LOVENOX) injection  40 mg Subcutaneous QHS  . pantoprazole sodium  20 mg Oral BID    Assessment/Plan: Patient without further episodes.  EEG reviewed and shows rare right frontal sharp transients.  Although in advance dementia some sharp transients can be seen on an EEG would consider starting AED's at this time based on presentation.    Recommendations: 1.  Vimpat  BID 2.  Seizure precautions   LOS: 1 day   Thana Farr, MD [facility] [phone] 06/10/2014  4:41 PM

## 2014-06-10 NOTE — Progress Notes (Signed)
UR completed 

## 2014-06-10 NOTE — Progress Notes (Addendum)
[facility] PROGRESS NOTE  [name] ZOX:[id number] DOB: August 23, 1924 DOA: 06/09/2014 PCP: Fredirick Maudlin, MD  Brief narrative: 80 y.o. female with advanced Alzheimer's dementia, hypertension, schizophrenia, and recurrent urinary tract infections, presented to [facility] with AMS. Please note that pt is unable to provide any history due to AMS imposed on advanced dementia. Per daughter at bedside, pt had very labile BP difficult to control, SBP ranging from 160 - 200's. Daughter has noted shaking like activity at home, one day prior to this admission and when she checked pt's BP she noted it was ~ 200/120. Daughter also explained that previous tests indicated left carotid stenosis 100% and required stent in her right carotid artery. In the emergency department, she was found to have urinary tract infection. Hospitalist service was asked to admit the patient for further care and management. Per daughter patient has not had any fever but was found to have low-grade temperature in the emergency department. Has not had any chills, nausea, vomiting, chest pain, shortness of breath, abdominal pain, diarrhea, headaches or vision changes.   Assessment and Plan:    Please see progress note by NP Algis Downs. I agree with her assessment and plan, This is the addendum to her A/P.  Principal Problem:   Altered mental status - appears to be multifactorial in etiology and secondary to UTI, imposed on advanced dementia - EEG reviewed by neurologist and shows rare right frontal sharp transients which can be seen in advanced dementia - Seizure like activity as well - Vimpat 500 mg BID started by neurologist, seizure precautions - PT/OT once pt able to participate - continue empiric ABX   UTI - agree with continuing Rocephin, need to follow up on urine culture   HTN, accelerated - SBP in 160's this AM  - family requested cardiology consultation and we certainly appreciate input - will follow up on  recommendations  Active Problems:   Dementia - advanced - family wants to have mother at home upon discharge  - BMP and CBC stable and WNL this AM - repeat blood work in AM   Severe PCM - secondary to advancing dementia - SLP recommended but daughter requesting to advance diet to dysphagia - aspiration precautions    Functional quadriplegia - secondary to advanced dementia, FTT, acute illness as noted above - PT eval once pt more medically stable   DVT prophylaxis  Lovenox SQ while pt is in hospital  Code Status: DNR Family Communication: NO family at bedside  Disposition Plan: Home when medically ready   IV Access:   Peripheral IV Procedures and diagnostic studies:   Ct Head Wo Contrast  06/09/2014  No acute intracranial process.   CXR  06/09/2014   hallow lung inflation with no acute cardiopulmonary abnormality identified.   Medical Consultants:   Neurology Cardiology  Other Consultants:   Physical therapy  Anti-Infectives:   Rocephin 9/24 -->  Debbora Presto, MD  [facility] Pager [phone]  If 7PM-7AM, please contact night-coverage [email or web address] Password [facility] 06/10/2014, 5:49 PM   LOS: 1 day   HPI/Subjective: No events overnight.   Objective: Filed Vitals:   06/10/14 0436 06/10/14 0604 06/10/14 1505 06/10/14 1600  BP:  122/55 158/83 160/60  Pulse:  61  89  Temp:  98.8 F (37.1 C)  98.6 F (37 C)  TempSrc: Oral Oral  Oral  Resp:  18  20  Height:      Weight:      SpO2:  96%  97%  z  Exam:   General:  Pt is non responding to any verbal commands, moaning with sternal rub  Cardiovascular: Regular rate and rhythm, no rubs, no gallops  Respiratory: Clear to auscultation bilaterally, no wheezing, diminished breath sounds at bases  Abdomen: Soft, non tender, non distended, bowel sounds present, no guarding  Data Reviewed: Basic Metabolic Panel:  Recent Labs Lab 06/09/14 1839 06/10/14 0534  NA 135* 141  K 4.7 4.1  CL 98 105  CO2 28 28   GLUCOSE 112* 87  BUN 15 13  CREATININE 0.95 0.93  CALCIUM 9.8 9.3   Liver Function Tests:  Recent Labs Lab 06/09/14 1839  AST 21  ALT 12  ALKPHOS 77  BILITOT 0.7  PROT 6.6  ALBUMIN 3.5   CBC:  Recent Labs Lab 06/09/14 1839 06/10/14 0534  WBC 6.3 4.7  HGB 12.8 12.1  HCT 37.9 36.2  MCV 88.8 88.9  PLT 199 190   CBG:  Recent Labs Lab 06/10/14 0737  GLUCAP 84   Scheduled Meds: . antiseptic oral rinse  7 mL Mouth Rinse BID  . cefTRIAXone IV  1 g Intravenous Q24H  . Enoxaparin injection  40 mg Subcutaneous QHS  . lacosamide (VIMPAT) IV  50 mg Intravenous Q12H  . pantoprazole sodium  20 mg Oral BID

## 2014-06-10 NOTE — Progress Notes (Signed)
PROGRESS NOTE  [name] QIO:[id number] DOB: [birth year] DOA: 06/09/2014 PCP: Fredirick Maudlin, MD  80 yo female from home with daughter.  PMH advanced Alzheimers dementia, carotid stenosis,  HTN, schizophrenia and recurrent UTIs.  Brought to the ER after an episode of raising her shaking arms in the air & then slumping over.  BP was elevated at the time (200s systolic) per the dtr.  9/25.  Patient told me she was sick and to get the hell out of the room.  Assessment/Plan:  Altered mental status after seizure-like episode. Per daughters description patient lost consciousness after episode was sluggish for a period afterward. May be due to elevated BP vs UTI. Appreciate neurology consult EEG complete but is not read yet.  UTI Rocephin on board Urine culture pending Appears to be on 50 mg of trimethoprim nightly at home.  Labile BP. Family reports SBP in low 200s dropping to 110s over a 45 minute period of time. Appears stable here in the hospital no on any BP medication. On Lisinopril - HCTZ at home. Have requested orthostatic BPs. And will ask her bp be checked q 2 hours for 4 occurences. Family insists on  Cardiology consultation.  (Son in Social worker, Toya Smothers, sees Dr. Diona Browner in [place])  Alzheimers Dementia Advanced. Dtr states she will take her mother home at d/c.  Has been in SNF in the past and will likely not return.  PT / OT requested. Patient stumbles and falls frequently at home.  Very weak in her arms. Family will take her home at d/c.  DVT Prophylaxis:  Lovenox  Code Status: DNR Family Communication: Spoke with POA Patti.  She is the only person we are to speak with. Disposition Plan: to home when appropriate.   Consultants:  Neuro  Cards  Procedures:  EEG - results pending.  Antibiotics: Anti-infectives   Start     Dose/Rate Route Frequency Ordered Stop   06/10/14 2100  cefTRIAXone (ROCEPHIN) 1 g in dextrose 5 % 50 mL IVPB     1  g 100 mL/hr over 30 Minutes Intravenous Every 24 hours 06/09/14 2224     06/09/14 2045  cefTRIAXone (ROCEPHIN) 1 g in dextrose 5 % 50 mL IVPB     1 g 100 mL/hr over 30 Minutes Intravenous  Once 06/09/14 2044 06/09/14 2211        Objective: Filed Vitals:   06/09/14 2154 06/09/14 2229 06/10/14 0436 06/10/14 0604  BP: 141/69 148/74  122/55  Pulse: 71 69  61  Temp:  98.8 F (37.1 C)  98.8 F (37.1 C)  TempSrc:  Oral Oral Oral  Resp: Height:      Weight:  53.071 kg (117 lb)    SpO2: 98% 96%  96%    Intake/Output Summary (Last 24 hours) at 06/10/14 1259 Last data filed at 06/10/14 3244  Gross per 24 hour  Intake      0 ml  Output      0 ml  Net      0 ml   Filed Weights   06/09/14 1620 06/09/14 2229  Weight: 53.071 kg (117 lb) 53.071 kg (117 lb)    Exam: General: Elderly, frail, diminished.  Eyes closed, cachetic.  Speech difficult to understand. HEENT:  Refuses to open eyes. Neck: Supple, no JVD, no masses  Cardiovascular: slightly irregular, S1 S2 auscultated, no rubs, murmurs or gallops.   Respiratory: Clear to auscultation bilaterally with equal chest rise  Abdomen: Soft, nontender, nondistended, + bowel sounds  Extremities: warm dry without cyanosis clubbing or edema. Patient does not move them at my request.l Psych: advanced dementia.   Data Reviewed: Basic Metabolic Panel:  Recent Labs Lab 06/09/14 1839 06/10/14 0534  NA 135* 141  K 4.7 4.1  CL 98 105  CO2 28 28  GLUCOSE 112* 87  BUN 15 13  CREATININE 0.95 0.93  CALCIUM 9.8 9.3   Liver Function Tests:  Recent Labs Lab 06/09/14 1839  AST 21  ALT 12  ALKPHOS 77  BILITOT 0.7  PROT 6.6  ALBUMIN 3.5   CBC:  Recent Labs Lab 06/09/14 1839 06/10/14 0534  WBC 6.3 4.7  HGB 12.8 12.1  HCT 37.9 36.2  MCV 88.8 88.9  PLT 199 190   BNP (last 3 results)  Recent Labs  12/18/13 1952  PROBNP 202.0   CBG:  Recent Labs Lab 06/10/14 0737  GLUCAP 84     Studies: Ct Head Wo  Contrast  06/09/2014   CLINICAL DATA:  Altered mental status, elevated blood pressure.  EXAM: CT HEAD WITHOUT CONTRAST  TECHNIQUE: Contiguous axial images were obtained from the base of the skull through the vertex without intravenous contrast.  COMPARISON:  Brain CT 02/15/2013  FINDINGS: Periventricular and subcortical white matter hypodensity compatible with chronic small vessel ischemic change. Ventricles and sulci are appropriate for patient's age. No evidence for acute cortically based infarct, intracranial hemorrhage, mass-effect mass lesion. Bilateral cataract surgery. Paranasal sinuses mastoid air cells are unremarkable.  IMPRESSION: No acute intracranial process.   Electronically Signed   By: Annia Belt M.D.   On: 06/09/2014 19:57   Dg Chest Portable 1 View  06/09/2014   CLINICAL DATA:  Loss of consciousness, hypertension  EXAM: PORTABLE CHEST - 1 VIEW  COMPARISON:  Prior radiograph from 12/31/2013  FINDINGS: Cardiac and mediastinal silhouettes are within normal limits.  Lungs are hypoinflated. There is minimal left basilar atelectasis. No focal infiltrate, pulmonary edema, or pleural effusion. No definite pneumothorax, although the patient's chin overlies the lung apices.  No acute osseus abnormality.  IMPRESSION: Shallow lung inflation with no acute cardiopulmonary abnormality identified.   Electronically Signed   By: Rise Mu M.D.   On: 06/09/2014 18:18    Scheduled Meds: . cefTRIAXone (ROCEPHIN)  IV  1 g Intravenous Q24H  . enoxaparin (LOVENOX) injection  40 mg Subcutaneous QHS  . pantoprazole sodium  20 mg Oral BID   Continuous Infusions:   Principal Problem:   Altered mental status Active Problems:   Dementia   Schizophrenia   UTI (urinary tract infection)   UTI (lower urinary tract infection)   Hypertension   Acute encephalopathy    Conley Canal  [facility] Pager [phone]. If 7PM-7AM, please contact night-coverage at [email or web address], password  [facility] 06/10/2014, 12:59 PM  LOS: 1 day

## 2014-06-11 DIAGNOSIS — G9340 Encephalopathy, unspecified: Secondary | ICD-10-CM

## 2014-06-11 LAB — BASIC METABOLIC PANEL
Anion gap: 13 (ref 5–15)
BUN: 11 mg/dL (ref 6–23)
CALCIUM: 9 mg/dL (ref 8.4–10.5)
CO2: 23 mEq/L (ref 19–32)
Chloride: 102 mEq/L (ref 96–112)
Creatinine, Ser: 0.81 mg/dL (ref 0.50–1.10)
GFR calc Af Amer: 73 mL/min — ABNORMAL LOW (ref >90)
GFR calc non Af Amer: 63 mL/min — ABNORMAL LOW (ref >90)
GLUCOSE: 96 mg/dL (ref 70–99)
Potassium: 4.5 mEq/L (ref 3.7–5.3)
Sodium: 138 mEq/L (ref 137–147)

## 2014-06-11 LAB — CBC
HEMATOCRIT: 37.4 % (ref 36.0–46.0)
Hemoglobin: 12.6 g/dL (ref 12.0–15.0)
MCH: 29.7 pg (ref 26.0–34.0)
MCHC: 33.7 g/dL (ref 30.0–36.0)
MCV: 88.2 fL (ref 78.0–100.0)
Platelets: 205 10*3/uL (ref 150–400)
RBC: 4.24 MIL/uL (ref 3.87–5.11)
RDW: 12.2 % (ref 11.5–15.5)
WBC: 6.5 10*3/uL (ref 4.0–10.5)

## 2014-06-11 MED ORDER — ENSURE PUDDING PO PUDG
1.0000 | Freq: Two times a day (BID) | ORAL | Status: DC
  Administered 2014-06-11 – 2014-06-12 (×3): 1 via ORAL

## 2014-06-11 MED ORDER — CEPHALEXIN 250 MG/5ML PO SUSR
500.0000 mg | Freq: Two times a day (BID) | ORAL | Status: DC
  Administered 2014-06-11 – 2014-06-12 (×2): 500 mg via ORAL
  Filled 2014-06-11 (×3): qty 10

## 2014-06-11 MED ORDER — LISINOPRIL 10 MG PO TABS
10.0000 mg | ORAL_TABLET | Freq: Two times a day (BID) | ORAL | Status: DC
  Administered 2014-06-11 – 2014-06-12 (×2): 10 mg via ORAL
  Filled 2014-06-11 (×4): qty 1

## 2014-06-11 MED ORDER — KETOCONAZOLE 2 % EX CREA
TOPICAL_CREAM | Freq: Two times a day (BID) | CUTANEOUS | Status: DC
  Administered 2014-06-11: 1 via TOPICAL
  Administered 2014-06-12: 11:00:00 via TOPICAL
  Filled 2014-06-11 (×2): qty 15

## 2014-06-11 NOTE — Evaluation (Signed)
Occupational Therapy Evaluation Patient Details Name: [name] MRN: [id number] DOB: May 18, 1924 Today's Date: 06/11/2014    History of Present Illness [name] is a 80 y.o. Caucasian female with history of advanced Alzheimer's dementia, hypertension, schizophrenia, and recurrent urinary tract infections who presents with AMS.  Diagnosis:  UTI, acute encephalopathy; labile hypertension    Clinical Impression   Pt admitted with above.  No family present during OT eval and attempt to call daughter was unsuccessful.  Therapy notes were reviewed from last admission which revealed that pt required total A with BADLs and ambulated with RW PTA.   Currently, she is able to perform simple grooming tasks spontaneously.  She ambulated in room for functional transfers with min/HHA.  She follows commands with multimodal cues intermittently, and was incontinent of urine.   Based on review of information in chart, it appears that pt is at, or close to her baseline for BADLs.   No further OT indicated at this time.       Follow Up Recommendations  No OT follow up;Supervision/Assistance - 24 hour    Equipment Recommendations  None recommended by OT    Recommendations for Other Services       Precautions / Restrictions Precautions Precautions: Fall      Mobility Bed Mobility Overal bed mobility: Needs Assistance Bed Mobility: Supine to Sit;Sit to Supine     Supine to sit: Min assist Sit to supine: Mod assist   General bed mobility comments: Pt requires assist to initiate the task as she is unable to follow commands to perform.    Transfers Overall transfer level: Needs assistance Equipment used: 1 person hand held assist Transfers: Sit to/from [employer] Sit to Stand: Min assist Stand pivot transfers: Min assist       General transfer comment: Pt requires HHA to steady and guide her through activity    Balance Overall balance assessment: Needs  assistance Sitting-balance support: Feet supported Sitting balance-Leahy Scale: Fair     Standing balance support: Single extremity supported Standing balance-Leahy Scale: Poor                              ADL Overall ADL's : Needs assistance/impaired Eating/Feeding: Maximal assistance   Grooming: Wash/dry hands;Wash/dry face;Set up;Supervision/safety;Sitting Grooming Details (indicate cue type and reason): washcloth placed in pt hand.  After a delay, she proceeded to wash her face and hands spontaneously  Upper Body Bathing: Total assistance   Lower Body Bathing: Total assistance;Sit to/from stand   Upper Body Dressing : Total assistance   Lower Body Dressing: Total assistance;Sit to/from stand   Toilet Transfer: Minimal assistance;Ambulation;Comfort height toilet;BSC Toilet Transfer Details (indicate cue type and reason): HHA to ambulate Toileting- Clothing Manipulation and Hygiene: Total assistance;Sit to/from stand Toileting - Clothing Manipulation Details (indicate cue type and reason): Pt incontinent of urine.  Required assist for peri care     Functional mobility during ADLs: Minimal assistance (HHA) General ADL Comments: Pt will follow an occasional command with multimodal cues     Vision                     Perception     Praxis      Pertinent Vitals/Pain Pain Assessment: Faces Faces Pain Scale: No hurt     Hand Dominance Right   Extremity/Trunk Assessment Upper Extremity Assessment Upper Extremity Assessment: Generalized weakness   Lower Extremity Assessment Lower Extremity  Assessment: Defer to PT evaluation       Communication Communication Communication: Other (comment) (speech at times nonsensical )   Cognition Arousal/Alertness: Awake/alert Behavior During Therapy: WFL for tasks assessed/performed Overall Cognitive Status: No family/caregiver present to determine baseline cognitive functioning (Pt with h/o advanced  dementia)                     General Comments       Exercises       Shoulder Instructions      Home Living Family/patient expects to be discharged to:: Private residence Living Arrangements: Children                               Additional Comments: Information above gleaned from chart review as no family present and attempt to contact daughter unsuccessful       Prior Functioning/Environment Level of Independence: Needs assistance  Gait / Transfers Assistance Needed: RW Supervision ADL's / Homemaking Assistance Needed: total   Comments: Information above, gleaned from chart review as no family is present     OT Diagnosis:     OT Problem List:     OT Treatment/Interventions:      OT Goals(Current goals can be found in the care plan section) Acute Rehab OT Goals OT Goal Formulation: Patient unable to participate in goal setting  OT Frequency:     Barriers to D/C:            Co-evaluation              End of Session Nurse Communication: Mobility status  Activity Tolerance: Patient tolerated treatment well Patient left: in bed;with call bell/phone within reach;with bed alarm set   Time: 1255-1310 OT Time Calculation (min): 15 min Charges:  OT General Charges $OT Visit: 1 Procedure OT Evaluation $Initial OT Evaluation Tier I: 1 Procedure OT Treatments $Therapeutic Activity: 8-22 mins G-Codes:    Denys Labree M 2014-07-01, 1:44 PM

## 2014-06-11 NOTE — Progress Notes (Signed)
[facility] PROGRESS NOTE  AASHA DINA ZOX:[id number] DOB: Jan 09, 1924 DOA: 06/09/2014 PCP: Fredirick Maudlin, MD  Brief narrative: 80 y.o. female with advanced Alzheimer's dementia, hypertension, schizophrenia, and recurrent urinary tract infections, presented to [facility] with AMS. Please note that pt is unable to provide any history due to AMS imposed on advanced dementia. Per daughter at bedside, pt had very labile BP difficult to control, SBP ranging from 160 - 200's. Daughter has noted shaking like activity at home, one day prior to this admission and when she checked pt's BP she noted it was ~ 200/120. Daughter also explained that previous tests indicated left carotid stenosis 100% and required stent in her right carotid artery. In the emergency department, she was found to have urinary tract infection. Hospitalist service was asked to admit the patient for further care and management. Per daughter patient has not had any fever but was found to have low-grade temperature in the emergency department. Has not had any chills, nausea, vomiting, chest pain, shortness of breath, abdominal pain, diarrhea, headaches or vision changes.   Assessment and Plan:    Principal Problem:   Altered mental status - appears to be multifactorial in etiology and secondary to UTI, imposed on advanced dementia - EEG reviewed by neurologist and shows rare right frontal sharp transients which can be seen in advanced dementia - Seizure like activity as well - Vimpat 500 mg BID started by neurologist, seizure precautions Likely back to baseline    UTI Change to po abx.  Culture negative.    HTN, accelerated Resume lisinopril. Daughter reports wide fluctuations "118 to 220". Not seen here  Active Problems:   Dementia Advanced. Takes pureed diet "no dairy, no greasy no acidic food:" will get dietitian consult  Tinea pedis:  Ketoconazole cream  Talked to daughter for 45 minutes about multiple complaints,  concerns, inconsistencies, etc. Discussed all findings, consultants opinions.  Asked charge nurse to talk with her as well  Code Status: DNR Family Communication: daughter Disposition Plan: likely home in am   Medical Consultants:   Neurology Cardiology    LOS: 2 days   HPI/Subjective: No complaints.  Objective: Filed Vitals:   06/10/14 2038 06/11/14 0425 06/11/14 0500 06/11/14 0945  BP: 163/75 158/62  166/72  Pulse: 92 90  74  Temp: 98.5 F (36.9 C) 98.3 F (36.8 C)  98.7 F (37.1 C)  TempSrc: Oral Oral  Oral  Resp: Height:      Weight: 53.071 kg (117 lb)  53.071 kg (117 lb)   SpO2: 97% 100%  100%  z  Exam:   General:  Alert, oriented in chair  Cardiovascular: Regular rate and rhythm, no rubs, no gallops  Respiratory: Clear to auscultation bilaterally, no wheezing, diminished breath sounds at bases  Abdomen: Soft, non tender, non distended, bowel sounds present, no guarding  Ext no CCE  Data Reviewed: Basic Metabolic Panel:  Recent Labs Lab 06/09/14 1839 06/10/14 0534 06/11/14 0632  NA 135* 141 138  K 4.7 4.1 4.5  CL 98 105 102  CO2 GLUCOSE 112* 87 96  BUN CREATININE 0.95 0.93 0.81  CALCIUM 9.8 9.3 9.0   Liver Function Tests:  Recent Labs Lab 06/09/14 1839  AST 21  ALT 12  ALKPHOS 77  BILITOT 0.7  PROT 6.6  ALBUMIN 3.5   CBC:  Recent Labs Lab 06/09/14 1839 06/10/14 0534 06/11/14 0632  WBC 6.3 4.7 6.5  HGB  12.8 12.1 12.6  HCT 37.9 36.2 37.4  MCV 88.8 88.9 88.2  PLT 199 190 205   CBG:  Recent Labs Lab 06/10/14 0737  GLUCAP 84   Scheduled Meds: . antiseptic oral rinse  7 mL Mouth Rinse BID  . cefTRIAXone IV  1 g Intravenous Q24H  . Enoxaparin injection  40 mg Subcutaneous QHS  . lacosamide (VIMPAT) IV  50 mg Intravenous Q12H  . pantoprazole sodium  20 mg Oral BID   Crista Curb, MD [facility] [phone]

## 2014-06-11 NOTE — Progress Notes (Signed)
SLP Cancellation Note  Patient Details Name: MALANIE KOLOSKI MRN: [id number] DOB: [birth year]   Cancelled treatment:       Reason Eval/Treat Not Completed: Other (comment). Discussed pt with RN who reports oral holding, spitting out food, no evidence of aspiration or respiratory distress she is aware of. Pt to continue current diet, will defer eval to next date.   Harlon Ditty, [place] CCC-SLP [phone]  Claudine Mouton 06/11/2014, 1:51 PM

## 2014-06-11 NOTE — Progress Notes (Signed)
Subjective: Patient awake and alert.  Not cooperative.    Objective: Current vital signs: BP 166/72  Pulse 74  Temp(Src) 98.7 F (37.1 C) (Oral)  Resp 16  Ht  (1.549 m)  Wt 53.071 kg (117 lb)  BMI 22.12 kg/m2  SpO2 100% Vital signs in last 24 hours: Temp:  [98.3 F (36.8 C)-98.7 F (37.1 C)] 98.7 F (37.1 C) (09/26 0945) Pulse Rate:  [74-92] 74 (09/26 0945) Resp:  [16-20] 16 (09/26 0945) BP: (133-166)/(46-83) 166/72 mmHg (09/26 0945) SpO2:  [97 %-100 %] 100 % (09/26 0945) Weight:  [53.071 kg (117 lb)] 53.071 kg (117 lb) (09/26 0500)  Intake/Output from previous day:   Intake/Output this shift: Total I/O In: 120 [P.O.:120] Out: -  Nutritional status: Dysphagia  Neurologic Exam: Mental Status:  Awake and alert.  More conversive.  Does not cooperate to follow commands.   Cranial Nerves:  II: Discs flat bilaterally; Blinks to bilateral confrontation, pupils equal, round, reactive to light  III,IV, VI: ptosis not present, extra-ocular motions intact bilaterally  V,VII: face symmetric, facial light touch sensation normal bilaterally  VIII: hearing normal bilaterally  IX,X: gag reflex present  XI: bilateral shoulder shrug no tested  XII: midline tongue extension without atrophy or fasciculations  Motor:  Seems to move all limbs symmetrically  Deep Tendon Reflexes:  1+ throughout  Plantars:  Right: downgoing   Left: downgoing    Lab Results: Basic Metabolic Panel:  Recent Labs Lab 06/09/14 1839 06/10/14 0534 06/11/14 0632  NA 135* 141 138  K 4.7 4.1 4.5  CL 98 105 102  CO2 GLUCOSE 112* 87 96  BUN CREATININE 0.95 0.93 0.81  CALCIUM 9.8 9.3 9.0    Liver Function Tests:  Recent Labs Lab 06/09/14 1839  AST 21  ALT 12  ALKPHOS 77  BILITOT 0.7  PROT 6.6  ALBUMIN 3.5   No results found for this basename: LIPASE, AMYLASE,  in the last 168 hours No results found for this basename: AMMONIA,  in the last 168  hours  CBC:  Recent Labs Lab 06/09/14 1839 06/10/14 0534 06/11/14 0632  WBC 6.3 4.7 6.5  HGB 12.8 12.1 12.6  HCT 37.9 36.2 37.4  MCV 88.8 88.9 88.2  PLT 199 190 205    Cardiac Enzymes: No results found for this basename: CKTOTAL, CKMB, CKMBINDEX, TROPONINI,  in the last 168 hours  Lipid Panel: No results found for this basename: CHOL, TRIG, HDL, CHOLHDL, VLDL, LDLCALC,  in the last 168 hours  CBG:  Recent Labs Lab 06/10/14 0737  GLUCAP 84    Microbiology: Results for orders placed during the hospital encounter of 06/09/14  URINE CULTURE     Status: None   Collection Time    06/09/14  7:05 PM      Result Value Ref Range Status   Specimen Description URINE, CATHETERIZED   Final   Special Requests NONE   Final   Culture  Setup Time     Final   Value: 06/09/2014 20:20     Performed at [employer] [facility] Count     Final   Value: NO GROWTH     Performed at [facility]   Culture     Final   Value: NO GROWTH     Performed at [facility]   Report Status 06/10/2014 FINAL   Final    Coagulation Studies: No results found for this basename: LABPROT,  INR,  in the last 72 hours  Imaging: Ct Head Wo Contrast  06/09/2014   CLINICAL DATA:  Altered mental status, elevated blood pressure.  EXAM: CT HEAD WITHOUT CONTRAST  TECHNIQUE: Contiguous axial images were obtained from the base of the skull through the vertex without intravenous contrast.  COMPARISON:  Brain CT 02/15/2013  FINDINGS: Periventricular and subcortical white matter hypodensity compatible with chronic small vessel ischemic change. Ventricles and sulci are appropriate for patient's age. No evidence for acute cortically based infarct, intracranial hemorrhage, mass-effect mass lesion. Bilateral cataract surgery. Paranasal sinuses mastoid air cells are unremarkable.  IMPRESSION: No acute intracranial process.   Electronically Signed   By: Annia Belt M.D.   On: 06/09/2014 19:57   Dg Chest  Portable 1 View  06/09/2014   CLINICAL DATA:  Loss of consciousness, hypertension  EXAM: PORTABLE CHEST - 1 VIEW  COMPARISON:  Prior radiograph from 12/31/2013  FINDINGS: Cardiac and mediastinal silhouettes are within normal limits.  Lungs are hypoinflated. There is minimal left basilar atelectasis. No focal infiltrate, pulmonary edema, or pleural effusion. No definite pneumothorax, although the patient's chin overlies the lung apices.  No acute osseus abnormality.  IMPRESSION: Shallow lung inflation with no acute cardiopulmonary abnormality identified.   Electronically Signed   By: Rise Mu M.D.   On: 06/09/2014 18:18    Medications:  I have reviewed the patient's current medications. Scheduled: . antiseptic oral rinse  7 mL Mouth Rinse BID  . cefTRIAXone (ROCEPHIN)  IV  1 g Intravenous Q24H  . enoxaparin (LOVENOX) injection  40 mg Subcutaneous QHS  . lacosamide (VIMPAT) IV  50 mg Intravenous Q12H  . pantoprazole sodium  20 mg Oral BID    Assessment/Plan: Patient more interactive than yesterday.  Seems to be tolerating the Vimpat.  No further seizure activity noted.    Recommendations: 1.  No further neurological testing recommended at this time.    Case discussed with daughter   LOS: 2 days   Thana Farr, MD [facility] [phone] 06/11/2014  1:31 PM

## 2014-06-11 NOTE — Progress Notes (Signed)
Note/Chart reviewed. Agree with assessment/intervention.  Ian Malkin RD, LDN Inpatient Clinical Dietitian Pager: [phone] After Hours Pager: [phone]

## 2014-06-11 NOTE — Evaluation (Addendum)
Physical Therapy Evaluation Patient Details Name: [name] MRN: [id number] DOB: October 07, 1923 Today's Date: 06/11/2014   History of Present Illness  [name] is a 80 y.o. Caucasian female with history of advanced Alzheimer's dementia, hypertension, schizophrenia, and recurrent urinary tract infections who presents with AMS.  Diagnosis:  UTI, acute encephalopathy; labile hypertension   Clinical Impression  Pt. Presents to PT with dependencies in functional mobility and gait and the below issues,  and would benefit from acute PT to advance her as she tolerated in preparation for home with family.  Pt. Is unable to follow directions and needs situational/environmental cueing for task completion.  No family present  during eval.      Follow Up Recommendations No PT follow up;Supervision/Assistance - 24 hour;Supervision for mobility/OOB    Equipment Recommendations  None recommended by PT    Recommendations for Other Services       Precautions / Restrictions Precautions Precautions: Fall Restrictions Weight Bearing Restrictions: No      Mobility  Bed Mobility Overal bed mobility: Needs Assistance Bed Mobility: Supine to Sit     Supine to sit: Mod assist Sit to supine: Mod assist   General bed mobility comments: Pt. needed mod assist to aadvance LEs to EOB, min assist to move side to sit  Transfers Overall transfer level: Needs assistance Equipment used: 1 person hand held assist Transfers: Sit to/from Stand Sit to Stand: Min assist;+2 safety/equipment Stand pivot transfers: Min assist       General transfer comment: Pt. needed min assist for sit to stand, second person available for safety  Ambulation/Gait Ambulation/Gait assistance: Min assist;+2 safety/equipment Ambulation Distance (Feet): 3 Feet Assistive device: None Gait Pattern/deviations: Step-through pattern;Decreased step length - right;Decreased step length - left        Stairs             Wheelchair Mobility    Modified Rankin (Stroke Patients Only)       Balance Overall balance assessment: Needs assistance Sitting-balance support: Feet supported Sitting balance-Leahy Scale: Fair     Standing balance support: Single extremity supported Standing balance-Leahy Scale: Poor Standing balance comment: needs assist to stabilize in standing                             Pertinent Vitals/Pain Pain Assessment: Faces Faces Pain Scale: No hurt    Home Living Family/patient expects to be discharged to:: Private residence Living Arrangements: Children Available Help at Discharge: Family Type of Home: House           Additional Comments: Information above gleaned from chart review as no family present     Prior Function Level of Independence: Needs assistance   Gait / Transfers Assistance Needed: RW Supervision  ADL's / Homemaking Assistance Needed: total  Comments: Information above, gleaned from chart review as no family is present      Hand Dominance   Dominant Hand: Right    Extremity/Trunk Assessment   Upper Extremity Assessment: Defer to OT evaluation           Lower Extremity Assessment: Generalized weakness         Communication   Communication: Other (comment) (nonsensical speech most of time)  Cognition Arousal/Alertness: Awake/alert Behavior During Therapy: WFL for tasks assessed/performed Overall Cognitive Status: No family/caregiver present to determine baseline cognitive functioning (h/o advanced dementia)       Memory: Decreased recall of precautions;Decreased short-term memory  General Comments      Exercises        Assessment/Plan    PT Assessment Patient needs continued PT services  PT Diagnosis Difficulty walking;Generalized weakness   PT Problem List Decreased strength;Decreased activity tolerance;Decreased balance;Decreased mobility;Decreased knowledge of use of DME;Decreased  cognition;Decreased safety awareness;Decreased knowledge of precautions  PT Treatment Interventions DME instruction;Gait training;Functional mobility training;Therapeutic activities;Therapeutic exercise;Balance training;Patient/family education   PT Goals (Current goals can be found in the Care Plan section) Acute Rehab PT Goals Patient Stated Goal: pt. copuld not provide her goals    Frequency Min 3X/week   Barriers to discharge        Co-evaluation               End of Session Equipment Utilized During Treatment: Other (comment) (deferred gait belt due to potential to provoke pt.) Activity Tolerance: Patient tolerated treatment well Patient left: in chair;with call bell/phone within reach;with chair alarm set Nurse Communication: Mobility status         Time: 2130-8657 PT Time Calculation (min): 22 min   Charges:   PT Evaluation $Initial PT Evaluation Tier I: 1 Procedure PT Treatments $Gait Training: 8-22 mins   PT G CodesFerman Hamming 06/11/2014, 3:14 PM Weldon Picking PT Acute Rehab Services [phone] Beeper [phone]

## 2014-06-11 NOTE — ED Provider Notes (Signed)
I saw and evaluated the patient, reviewed the resident's note and I agree with the findings and plan.   EKG Interpretation   Date/Time:  Thursday June 09 2014 16:26:01 EDT Ventricular Rate:  69 PR Interval:  200 QRS Duration: 92 QT Interval:  378 QTC Calculation: 405 R Axis:   70 Text Interpretation:  Sinus rhythm with Premature supraventricular  complexes and with occasional Premature ventricular complexes Otherwise  normal ECG No significant change since last tracing Confirmed by YAO  MD,  DAVID ([id number]) on 06/09/2014 5:34:38 PM      [name] is a 80 y.o. female hx of HTN, dementia, recurrent UTIs here with AMS, possible seizure. Patient demented, unable to give hx. Per daughter, she jerked back and had some shakiness but no incontinence. No hx of seizures. On exam, demented, moving all extremities. Hear, lung abdomen unremarkable. Back to baseline now. Labs unremarkable. CXR nl. UA ? UTI. I counseled family regarding d/c with abx vs admission and daughter was very concerned for her to stay home so will give ceftriaxone and admit for monitoring.   Level V caveat- dementia    Richardean Canal, MD 06/11/14 [phone]

## 2014-06-11 NOTE — Progress Notes (Signed)
INITIAL NUTRITION ASSESSMENT  DOCUMENTATION CODES Per approved criteria  -Not Applicable   INTERVENTION:  Provide Ensure Pudding po BID, each supplement provides 170 kcal and 4 grams of protein  Encourage PO intake  Will monitor for changes in intake  NUTRITION DIAGNOSIS: Predicted suboptimal energy intake related to altered mental status, dysphagia as evidenced by need for texture modified diet.   Goal: Pt to meet >/= 90% of their estimated nutrition needs   Monitor:  PO and supplemental intake, weight, labs, I/O's  Reason for Assessment: Consult for nutritional assessment   Admitting Dx: Altered mental status  ASSESSMENT: 80 y.o. Caucasian female with history of advanced Alzheimer's dementia, hypertension, schizophrenia, and recurrent urinary tract infections. Patient due to dementia cannot provide any history.  Pt is nonverbal, unable to obtain history. No family in the room during visit.  Pt is on a Dys 2, Heart Healthy diet, PO intake: 75%. Will continue to monitor intake. Will send Ensure Pudding BID.  Per weight history documentation, in 2014, pt had weight loss of 11% x 1 year. No further wt loss has occurred, wt has been stable at 117 lb since April.   Unable to perform Nutrition Focused Physical Exam at this time.  Labs reviewed.   Height: Ht Readings from Last 1 Encounters:  06/09/14  (1.549 m)    Weight: Wt Readings from Last 1 Encounters:  06/11/14 117 lb (53.071 kg)    Ideal Body Weight: 105 lb  % Ideal Body Weight: 111%  Wt Readings from Last 10 Encounters:  06/11/14 117 lb (53.071 kg)  04/27/14 117 lb (53.071 kg)  12/24/13 117 lb 1.6 oz (53.116 kg)  12/11/13 112 lb (50.803 kg)  10/02/12 128 lb (58.06 kg)    Usual Body Weight: unable to obtain   % Usual Body Weight: NA  BMI:  Body mass index is 22.12 kg/(m^2).  Estimated Nutritional Needs: Kcal: 1350-1550 Protein: 65-75g Fluid: 1.4L/day  Skin: intact  Diet Order:  [id number] & Heart Healthy  EDUCATION NEEDS: -No education needs identified at this time  No intake or output data in the 24 hours ending 06/11/14 0846  Last BM: pt unsure per documentation  Labs:   Recent Labs Lab 06/09/14 1839 06/10/14 0534 06/11/14 0632  NA 135* 141 138  K 4.7 4.1 4.5  CL 98 105 102  CO2 BUN CREATININE 0.95 0.93 0.81  CALCIUM 9.8 9.3 9.0  GLUCOSE 112* 87 96    CBG (last 3)   Recent Labs  06/10/14 0737  GLUCAP 84    Scheduled Meds: . antiseptic oral rinse  7 mL Mouth Rinse BID  . cefTRIAXone (ROCEPHIN)  IV  1 g Intravenous Q24H  . enoxaparin (LOVENOX) injection  40 mg Subcutaneous QHS  . lacosamide (VIMPAT) IV  50 mg Intravenous Q12H  . pantoprazole sodium  20 mg Oral BID    Continuous Infusions:   Past Medical History  Diagnosis Date  . Hypertension   . Reflux   . Schizophrenia   . Alzheimer disease   . Chronic back pain   . Clostridium difficile colitis   . Hiatal hernia   . Vertebral compression fracture     Past Surgical History  Procedure Laterality Date  . Carotid stent    . Tumor of lung -benign , and removed.    . Carotid endarterectomy    . Lung removal, partial      Tilda Franco, MS, RD, PLDN Provisionally Licensed  Dietitian Nutritionist Pager: [phone]

## 2014-06-11 NOTE — Progress Notes (Signed)
VASCULAR LAB PRELIMINARY  PRELIMINARY  PRELIMINARY  PRELIMINARY  Carotid Dopplers completed.    Preliminary report:  There is 1-39% right ICA stenosis.  Unable to adequately insonate the left ICA secondary to patient cooperation and constant chewing motion.   Christyne Mccain, RVT 06/11/2014, 7:06 PM

## 2014-06-12 ENCOUNTER — Telehealth: Payer: Self-pay | Admitting: Cardiology

## 2014-06-12 MED ORDER — CEPHALEXIN 250 MG/5ML PO SUSR: 500.0000 mg | Freq: Two times a day (BID) | ORAL | Status: DC

## 2014-06-12 MED ORDER — LACOSAMIDE 10 MG/ML PO SOLN: 50.0000 mg | Freq: Two times a day (BID) | ORAL | Status: DC

## 2014-06-12 NOTE — Progress Notes (Signed)
Discharge instructions reviewed with patient's daughter Clayborne Dana. Patient to be discharged to home.  Leanna Battles, RN.

## 2014-06-12 NOTE — Discharge Summary (Signed)
Physician Discharge Summary  [name]:[id number] DOB: 1924/03/22 DOA: Deborah/24/2015  PCP: Fredirick Maudlin, MD  Admit date: Deborah/24/2015 Discharge date: Deborah/27/2015  Time spent: greater than 30 minutes  Recommendations for Outpatient Follow-up:  1. F/u neurologist within a month  Discharge Diagnoses:  Principal Problem: Acute Encephalopathy, likely partial seizure Active Problems:   Dementia   Schizophrenia   UTI (lower urinary tract infection)   Hypertension H/o right carotid endarterectomy Tinea pedis  Discharge Condition: stable  Filed Weights   06/11/14 0500 06/11/14 2122 06/12/14 0500  Weight: 53.071 kg (117 lb) 47.991 kg (105 lb 12.8 oz) 48 kg (105 lb 13.1 oz)    History of present illness:  80 y.o. Caucasian Fleming with history of advanced Alzheimer's dementia, hypertension, schizophrenia, and recurrent urinary tract infections who presents with the above complaints. Most of the history was obtained by daughter at bedside. Patient due to dementia cannot provide any history. Daughter indicated that over the last few months patient has had labile blood pressure with blood pressures in the 180s, 190s, 200s at times followed by rapid improvement in blood pressures. Her blood pressure has been managed by her primary care physician.  Today at 2 p.m., patient had a sudden episode where she had her head fall back and had shaking-like activity which lasted less than 10 seconds. Daughter checked her blood pressure and it was elevated in the 200s systolic. Of note, about her reports that patient had a similar episode in 2007 where she had the headache and had her head fall back with eyes rolled. Eventually testing showed she had left carotid stenosis 100% and required stent in her right carotid artery. In the emergency department, she was found to have urinary tract infection. Hospitalist service was asked to admit the patient for further care and management. Per daughter patient has not  had any fever but was found to have low-grade temperature in the emergency department. Has not had any chills, nausea, vomiting, chest pain, shortness of breath, abdominal pain, diarrhea, headaches or vision changes.   Hospital Course:  Acute encephalopathy - appears to be multifactorial in etiology and secondary to UTI, imposed on advanced dementia, possibly partial seizure Neurology consulted. Started vimpat 50 mg bid, which patient has tolerated. - EEG reviewed by neurologist and shows rare right frontal sharp transients which can be seen in advanced dementia - Seizure like activity as well   UTI  UA with moderate leukocyte esterase, but culture negative. Treated with rocephin then transitioned to keflex. Will complete a 5 day course  HTN,  continue lisinopril. Daughter reports wide fluctuations "118 to 220". Not seen here. Ranging 120 - 160.  Daughter requested cardiology consult. Dr. Gala Romney consulted and recommended no change in antihypertensives since blood pressures ranging 120-140 systolic and recommends SBP goal around 140s without intervention given frailty and dementia.    Dementia  Advanced. Takes pureed diet "no dairy, no greasy no acidic food:"   Tinea pedis: continue Ketoconazole cream   History of carotid disease, reportedly right carotid stent and left carotid occlusion:  Daughter requested doppler carotids. Right 1-39% stenosis. Tech unable to study left due to patient agitation.   Procedures:  none  Consultations:  Neurology  [facility]  Discharge Exam: Filed Vitals:   06/12/14 0651  BP: 165/56  Pulse: 63  Temp: 98.2 F (36.8 C)  Resp: 16    General: asleep. arousable Cardiovascular: RRR without MGR Respiratory: CTA without WRR S, NT, ND No CCE  Current Discharge Medication  List    START taking these medications   Details  cephALEXin (KEFLEX) 250 MG/5ML suspension Take 10 mLs (500 mg total) by mouth every 12 (twelve) hours. Through  Deborah/29/15 Qty: 60 mL, Refills: 0    Lacosamide 10 MG/ML SOLN Take 5 mLs (50 mg total) by mouth 2 (two) times daily. Qty: 600 mL, Refills: 0      CONTINUE these medications which have NOT CHANGED   Details  ketoconazole (NIZORAL) 2 % cream Apply 1 application topically 2 (two) times daily as needed. Applies to both feet up to her ankles    Multiple Vitamins-Minerals (MULTIVITAMIN & MINERAL PO) Take 15 mLs by mouth daily.    pantoprazole sodium (PROTONIX) 40 mg/20 mL PACK Take 20 mg by mouth 2 (two) times daily.    PRESCRIPTION MEDICATION Take 20 mg by mouth 2 (two) times daily. Lisinopril 20 mg / 5 mL suspension    PRIMSOL 50 MG/5ML SOLN Take 10 mLs by mouth at bedtime.        Allergies  Allergen Reactions  . Codeine Other (See Comments)    UNKNOWN REACTION      The results of significant diagnostics from this hospitalization (including imaging, microbiology, ancillary and laboratory) are listed below for reference.    Significant Diagnostic Studies: Ct Head Wo Contrast  Deborah/24/2015   CLINICAL DATA:  Altered mental status, elevated blood pressure.  EXAM: CT HEAD WITHOUT CONTRAST  TECHNIQUE: Contiguous axial images were obtained from the base of the skull through the vertex without intravenous contrast.  COMPARISON:  Brain CT 02/15/2013  FINDINGS: Periventricular and subcortical white matter hypodensity compatible with chronic small vessel ischemic change. Ventricles and sulci are appropriate for patient's age. No evidence for acute cortically based infarct, intracranial hemorrhage, mass-effect mass lesion. Bilateral cataract surgery. Paranasal sinuses mastoid air cells are unremarkable.  IMPRESSION: No acute intracranial process.   Electronically Signed   By: Annia Belt M.D.   On: 06/09/2014 19:57   Dg Chest Portable 1 View  Deborah/24/2015   CLINICAL DATA:  Loss of consciousness, hypertension  EXAM: PORTABLE CHEST - 1 VIEW  COMPARISON:  Prior radiograph from 12/31/2013  FINDINGS: Cardiac  and mediastinal silhouettes are within normal limits.  Lungs are hypoinflated. There is minimal left basilar atelectasis. No focal infiltrate, pulmonary edema, or pleural effusion. No definite pneumothorax, although the patient's chin overlies the lung apices.  No acute osseus abnormality.  IMPRESSION: Shallow lung inflation with no acute cardiopulmonary abnormality identified.   Electronically Signed   By: Rise Mu M.D.   On: 06/09/2014 18:18   Carotid doppler: Right: 1-39% stenosis. Left unable to visualize due to patient agitation  EEG:  This is an abnormal electroencephalogram secondary to rare right frontal sharp transients.   Microbiology: Recent Results (from the past 240 hour(s))  URINE CULTURE     Status: None   Collection Time    06/09/14  7:05 PM      Result Value Ref Range Status   Specimen Description URINE, CATHETERIZED   Final   Special Requests NONE   Final   Culture  Setup Time     Final   Value: 06/09/2014 20:20     Performed at [facility] Count     Final   Value: NO GROWTH     Performed at [facility]   Culture     Final   Value: NO GROWTH     Performed at [facility]   Report  Status 06/10/2014 FINAL   Final     Labs: Basic Metabolic Panel:  Recent Labs Lab 06/09/14 1839 06/10/14 0534 06/11/14 0632  NA 135* 141 138  K 4.7 4.1 4.5  CL 98 105 102  CO2 GLUCOSE 112* 87 96  BUN CREATININE 0.95 0.93 0.81  CALCIUM Deborah.8 Deborah.3 Deborah.0   Liver Function Tests:  Recent Labs Lab 06/09/14 1839  AST 21  ALT 12  ALKPHOS 77  BILITOT 0.7  PROT 6.6  ALBUMIN 3.5   No results found for this basename: LIPASE, AMYLASE,  in the last 168 hours No results found for this basename: AMMONIA,  in the last 168 hours CBC:  Recent Labs Lab 06/09/14 1839 06/10/14 0534 06/11/14 0632  WBC 6.3 4.7 6.5  HGB 12.8 12.1 12.6  HCT 37.Deborah 36.2 37.4  MCV 88.8 88.Deborah 88.2  PLT 199 190 205   Cardiac Enzymes: No  results found for this basename: CKTOTAL, CKMB, CKMBINDEX, TROPONINI,  in the last 168 hours BNP: BNP (last 3 results)  Recent Labs  12/18/13 1952  PROBNP 202.0   CBG:  Recent Labs Lab 06/10/14 0737  GLUCAP 84    Signed:  Dacari Beckstrand L  [facility] Deborah/27/2015, Deborah:41 AM

## 2014-06-12 NOTE — Telephone Encounter (Signed)
Pt's daughter Clayborne Dana called with her mother's labile BP.  The a fib was what her daughter reports was on her BP monitor - the pt is being discharged today we will follow in the office.  Daughter very concerned about her BP for now no meds, we will see in follow up.   She has requested Dr. Diona Browner.

## 2014-06-16 NOTE — Progress Notes (Signed)
Error. No show.

## 2014-06-17 ENCOUNTER — Encounter: Payer: Medicare Other | Admitting: Adult Health

## 2014-06-24 NOTE — Progress Notes (Signed)
NO Show

## 2014-06-27 ENCOUNTER — Encounter: Payer: Self-pay | Admitting: *Deleted

## 2014-06-27 ENCOUNTER — Encounter: Payer: Medicare Other | Admitting: Adult Health

## 2014-06-30 ENCOUNTER — Encounter: Payer: Medicare Other | Admitting: Physician Assistant

## 2014-07-01 DIAGNOSIS — N390 Urinary tract infection, site not specified: Secondary | ICD-10-CM | POA: Diagnosis not present

## 2014-07-04 DIAGNOSIS — F0390 Unspecified dementia without behavioral disturbance: Secondary | ICD-10-CM | POA: Diagnosis not present

## 2014-07-04 DIAGNOSIS — R05 Cough: Secondary | ICD-10-CM | POA: Diagnosis not present

## 2014-07-04 DIAGNOSIS — I10 Essential (primary) hypertension: Secondary | ICD-10-CM | POA: Diagnosis not present

## 2015-02-05 ENCOUNTER — Emergency Department (HOSPITAL_COMMUNITY)
Admission: EM | Admit: 2015-02-05 | Discharge: 2015-02-06 | Disposition: A | Discharge status: Home / Self Care | Payer: Medicare Other | Attending: Emergency Medicine | Admitting: Emergency Medicine

## 2015-02-05 ENCOUNTER — Emergency Department (HOSPITAL_COMMUNITY)
Admission: EM | Admit: 2015-02-05 | Discharge: 2015-02-05 | Discharge status: Absent without leave | Payer: Medicare Other

## 2015-02-05 ENCOUNTER — Encounter (HOSPITAL_COMMUNITY): Payer: Self-pay | Admitting: Emergency Medicine

## 2015-02-05 DIAGNOSIS — Z8781 Personal history of (healed) traumatic fracture: Secondary | ICD-10-CM | POA: Diagnosis not present

## 2015-02-05 DIAGNOSIS — G8929 Other chronic pain: Secondary | ICD-10-CM

## 2015-02-05 DIAGNOSIS — N3000 Acute cystitis without hematuria: Secondary | ICD-10-CM

## 2015-02-05 DIAGNOSIS — F0390 Unspecified dementia without behavioral disturbance: Secondary | ICD-10-CM | POA: Diagnosis present

## 2015-02-05 DIAGNOSIS — Z79899 Other long term (current) drug therapy: Secondary | ICD-10-CM | POA: Insufficient documentation

## 2015-02-05 DIAGNOSIS — Z8619 Personal history of other infectious and parasitic diseases: Secondary | ICD-10-CM | POA: Diagnosis not present

## 2015-02-05 DIAGNOSIS — Z8719 Personal history of other diseases of the digestive system: Secondary | ICD-10-CM | POA: Insufficient documentation

## 2015-02-05 DIAGNOSIS — I10 Essential (primary) hypertension: Secondary | ICD-10-CM | POA: Diagnosis not present

## 2015-02-05 DIAGNOSIS — M549 Dorsalgia, unspecified: Secondary | ICD-10-CM

## 2015-02-05 DIAGNOSIS — F0280 Dementia in other diseases classified elsewhere without behavioral disturbance: Secondary | ICD-10-CM | POA: Diagnosis not present

## 2015-02-05 DIAGNOSIS — F209 Schizophrenia, unspecified: Secondary | ICD-10-CM | POA: Diagnosis present

## 2015-02-05 DIAGNOSIS — G309 Alzheimer's disease, unspecified: Secondary | ICD-10-CM | POA: Diagnosis not present

## 2015-02-05 DIAGNOSIS — N390 Urinary tract infection, site not specified: Secondary | ICD-10-CM | POA: Diagnosis present

## 2015-02-05 LAB — BASIC METABOLIC PANEL
Anion gap: 8 (ref 5–15)
BUN: 24 mg/dL — ABNORMAL HIGH (ref 6–20)
CALCIUM: 8.9 mg/dL (ref 8.9–10.3)
CHLORIDE: 105 mmol/L (ref 101–111)
CO2: 29 mmol/L (ref 22–32)
CREATININE: 0.83 mg/dL (ref 0.44–1.00)
GFR calc Af Amer: >60 mL/min (ref >60)
Glucose, Bld: 109 mg/dL — ABNORMAL HIGH (ref 65–99)
Potassium: 4 mmol/L (ref 3.5–5.1)
SODIUM: 142 mmol/L (ref 135–145)

## 2015-02-05 LAB — URINE MICROSCOPIC-ADD ON

## 2015-02-05 LAB — CBC WITH DIFFERENTIAL/PLATELET
Basophils Absolute: 0 10*3/uL (ref 0.0–0.1)
Basophils Relative: 1 % (ref 0–1)
EOS PCT: 2 % (ref 0–5)
Eosinophils Absolute: 0.1 10*3/uL (ref 0.0–0.7)
HEMATOCRIT: 39.5 % (ref 36.0–46.0)
Hemoglobin: 12.3 g/dL (ref 12.0–15.0)
LYMPHS ABS: 1.7 10*3/uL (ref 0.7–4.0)
LYMPHS PCT: 33 % (ref 12–46)
MCH: 28.5 pg (ref 26.0–34.0)
MCHC: 31.1 g/dL (ref 30.0–36.0)
MCV: 91.6 fL (ref 78.0–100.0)
MONOS PCT: 6 % (ref 3–12)
Monocytes Absolute: 0.3 10*3/uL (ref 0.1–1.0)
NEUTROS PCT: 58 % (ref 43–77)
Neutro Abs: 3.1 10*3/uL (ref 1.7–7.7)
Platelets: 175 10*3/uL (ref 150–400)
RBC: 4.31 MIL/uL (ref 3.87–5.11)
RDW: 13.2 % (ref 11.5–15.5)
WBC: 5.3 10*3/uL (ref 4.0–10.5)

## 2015-02-05 LAB — URINALYSIS, ROUTINE W REFLEX MICROSCOPIC
Bilirubin Urine: NEGATIVE
GLUCOSE, UA: NEGATIVE mg/dL
Ketones, ur: NEGATIVE mg/dL
Nitrite: POSITIVE — AB
PH: 7 (ref 5.0–8.0)
Protein, ur: NEGATIVE mg/dL
Specific Gravity, Urine: 1.015 (ref 1.005–1.030)
UROBILINOGEN UA: 0.2 mg/dL (ref 0.0–1.0)

## 2015-02-05 LAB — POC OCCULT BLOOD, ED: FECAL OCCULT BLD: NEGATIVE

## 2015-02-05 MED ORDER — SODIUM CHLORIDE 0.9 % IV BOLUS (SEPSIS)
500.0000 mL | Freq: Once | INTRAVENOUS | Status: AC
  Administered 2015-02-05: 500 mL via INTRAVENOUS

## 2015-02-05 MED ORDER — CEPHALEXIN 500 MG PO CAPS: 500.0000 mg | ORAL_CAPSULE | Freq: Four times a day (QID) | ORAL | Status: DC

## 2015-02-05 MED ORDER — DEXTROSE 5 % IV SOLN
1.0000 g | Freq: Once | INTRAVENOUS | Status: AC
  Administered 2015-02-05: 1 g via INTRAVENOUS
  Filled 2015-02-05: qty 10

## 2015-02-05 NOTE — ED Notes (Signed)
Pt's stepson Casimiro NeedleMichael contacted to pick up patient from hospital reference discharge status. He reports that he will be here to retrieve patient as soon as possible.

## 2015-02-05 NOTE — ED Notes (Signed)
Daughter called upset due to "hospital not doing anything" for her mother, after listening to her concern she feels it is a rectal brown, slick type stringy, discharge not vaginal. Due to things she has been told, she has a fear that her mother may have cancer. Information will be passed on to MD.

## 2015-02-05 NOTE — ED Notes (Signed)
Pt's daughter Merrilyn Pumaatty Hutcherson [facility](POA)  [phone] would like to be contacted each time pt needs any treatment or tests.  She reports that pt does not take any meds other than a cream.  She also reports that pt has had this "brown slimy" substance which she is not sure where it's coming from.  She reports that this has been going on since January.  She also states that d/t her condition and per her PCP's advice, she is no longer able to take care of pt, therefore, needs to be placed.

## 2015-02-05 NOTE — Consult Note (Signed)
PCP:   Fredirick Maudlin, MD   Reason for consult:  Family wont come pick up patient, please evaluate for admission for possible placement Consulting service:  ED  HPI: 81 yo female h/o dementia, schizophrenia, htn brought in by her daughter with whom she lives with for what appears to be foul smell (urine?) and wanting patient hospitalized for SNF placement.  All history obtained from record and ER PA and nurses.  Pt is pleasantly demented and is reported to be at her baseline by family.  No fevers.  No n/v/d.  No pain issues.  Pt was in the process of being discharged back to home from the ED at which time family would not take her home, please see nursing notes.  Patient was then referred to [place] for evaluation of whether patient meets admission for placement criteria.  Pt resting in her ER bed, with no problems, pleasant and demented.  She is found to have a uti for which she has been given rocephin and also has received 500cc ivf bolus.  Review of Systems:  Unobtainable from pt due to dementia  Past Medical History: Past Medical History  Diagnosis Date  . Hypertension   . Reflux   . Schizophrenia   . Alzheimer disease   . Chronic back pain   . Clostridium difficile colitis   . Hiatal hernia   . Vertebral compression fracture    Past Surgical History  Procedure Laterality Date  . Carotid stent    . Tumor of lung -benign , and removed.    . Carotid endarterectomy    . Lung removal, partial      Medications: Prior to Admission medications   Medication Sig Start Date End Date Taking? Authorizing Provider  ketoconazole (NIZORAL) 2 % cream Apply 1 application topically 2 (two) times daily as needed. Applies to both feet up to her ankles 06/06/14  Yes Historical Provider, MD  Multiple Vitamins-Minerals (MULTIVITAMIN & MINERAL PO) Take 15 mLs by mouth daily.   Yes Historical Provider, MD  cephALEXin (KEFLEX) 500 MG capsule Take 1 capsule (500 mg total) by mouth 4 (four) times daily.  02/05/15   Everlene Farrier, PA-C  Lacosamide 10 MG/ML SOLN Take 5 mLs (50 mg total) by mouth 2 (two) times daily. Patient not taking: Reported on 02/05/2015 06/12/14   Christiane Ha, MD    Allergies:   Allergies  Allergen Reactions  . Codeine Other (See Comments)    UNKNOWN REACTION    Social History:  reports that she has never smoked. She does not have any smokeless tobacco history on file. She reports that she does not drink alcohol or use illicit drugs.  Family History: Family History  Problem Relation Age of Onset  . Family history unknown: Yes    Physical Exam: Filed Vitals:   02/05/15 1524 02/05/15 1746 02/05/15 2002 02/05/15 2304  BP: 170/63 196/63 120/93 180/69  Pulse: 77 70 77 89  Temp: 98.4 F (36.9 C)  98.4 F (36.9 C) 98.6 F (37 C)  TempSrc: Oral  Axillary Axillary  Resp: SpO2: 96% 97% 98% 94%   General appearance: alert, cooperative and no distress  Overall well appearing and well taken care of Head: Normocephalic, without obvious abnormality, atraumatic Eyes: negative Nose: Nares normal. Septum midline. Mucosa normal. No drainage or sinus tenderness. Neck: no JVD and supple, symmetrical, trachea midline Lungs: clear to auscultation bilaterally Heart: regular rate and rhythm, S1, S2 normal, no murmur, click, rub or  gallop Abdomen: soft, non-tender; bowel sounds normal; no masses,  no organomegaly Extremities: extremities normal, atraumatic, no cyanosis or edema Pulses: 2+ and symmetric Skin: Skin color, texture, turgor normal. No rashes or lesions Neurologic: Grossly normal pleasantly demented    Labs on Admission:   Recent Labs  02/05/15 1835  NA 142  K 4.0  CL 105  CO2 29  GLUCOSE 109*  BUN 24*  CREATININE 0.83  CALCIUM 8.9    Recent Labs  02/05/15 1835  WBC 5.3  NEUTROABS 3.1  HGB 12.3  HCT 39.5  MCV 91.6  PLT 175   Radiological Exams on Admission: No results found.  Old records  reviewed  Assessment/Plan  81 yo female with UTI    Principal Problem:   UTI (urinary tract infection)-  No culture data here for a year, culture for today is pending.  Agree with rocephin iv than place on oral abx keflex.  Can add on a stat lactic acid level, which will probably be normal, but IF it is elevated after 500cc ivf bolus already given we could observe her overnight for ivf and lactic acidosis.   In the setting of uti alone with unremarkable cbc, bmp patient does not meet critieria for hospitalization to get placement.  Consider getting social work involved to help with this, this can also be arranged with help of patients PCP as an outpatient.    Active Problems:   Dementia- stable   Schizophrenia- stable   Hypertension- stable  Consult time 45 minutes to review chart, assess patient and speak to ED staff.  Please let us know if her lactic acid level is significantly elevated or any other acute issues arise.  Discussed evaluation and plan with Will Marijo Fileansie, PA in ED.  Patient seen and evaluated before midnight.  Earma Nicolaou A 02/05/2015, 11:52 PM

## 2015-02-05 NOTE — ED Notes (Signed)
Bed: WA16 Expected date:  Expected time:  Means of arrival:  Comments: Hold triage 1  

## 2015-02-05 NOTE — ED Notes (Signed)
Main lab called for blood

## 2015-02-05 NOTE — Discharge Instructions (Signed)
Urinary Tract Infection Urinary tract infections (UTIs) can develop anywhere along your urinary tract. Your urinary tract is your body's drainage system for removing wastes and extra water. Your urinary tract includes two kidneys, two ureters, a bladder, and a urethra. Your kidneys are a pair of bean-shaped organs. Each kidney is about the size of your fist. They are located below your ribs, one on each side of your spine. CAUSES Infections are caused by microbes, which are microscopic organisms, including fungi, viruses, and bacteria. These organisms are so small that they can only be seen through a microscope. Bacteria are the microbes that most commonly cause UTIs. SYMPTOMS  Symptoms of UTIs may vary by age and gender of the patient and by the location of the infection. Symptoms in young women typically include a frequent and intense urge to urinate and a painful, burning feeling in the bladder or urethra during urination. Older women and men are more likely to be tired, shaky, and weak and have muscle aches and abdominal pain. A fever may mean the infection is in your kidneys. Other symptoms of a kidney infection include pain in your back or sides below the ribs, nausea, and vomiting. DIAGNOSIS To diagnose a UTI, your caregiver will ask you about your symptoms. Your caregiver also will ask to provide a urine sample. The urine sample will be tested for bacteria and white blood cells. White blood cells are made by your body to help fight infection. TREATMENT  Typically, UTIs can be treated with medication. Because most UTIs are caused by a bacterial infection, they usually can be treated with the use of antibiotics. The choice of antibiotic and length of treatment depend on your symptoms and the type of bacteria causing your infection. HOME CARE INSTRUCTIONS  If you were prescribed antibiotics, take them exactly as your caregiver instructs you. Finish the medication even if you feel better after you  have only taken some of the medication.  Drink enough water and fluids to keep your urine clear or pale yellow.  Avoid caffeine, tea, and carbonated beverages. They tend to irritate your bladder.  Empty your bladder often. Avoid holding urine for long periods of time.  Empty your bladder before and after sexual intercourse.  After a bowel movement, women should cleanse from front to back. Use each tissue only once. SEEK MEDICAL CARE IF:   You have back pain.  You develop a fever.  Your symptoms do not begin to resolve within 3 days. SEEK IMMEDIATE MEDICAL CARE IF:   You have severe back pain or lower abdominal pain.  You develop chills.  You have nausea or vomiting.  You have continued burning or discomfort with urination. MAKE SURE YOU:   Understand these instructions.  Will watch your condition.  Will get help right away if you are not doing well or get worse. Document Released: 06/12/2005 Document Revised: 03/03/2012 Document Reviewed: 10/11/2011 [facility] Patient Information 2015 [place]. This information is not intended to replace advice given to you by your health care provider. Make sure you discuss any questions you have with your health care provider.  Antibiotic Medication Antibiotic medicine helps fight germs. Germs cause infections. This type of medicine will not work for colds, flu, or other viral infections. Tell your doctor if you:  Are allergic to any medicines.  Are pregnant or are trying to get pregnant.  Are taking other medicines.  Have other medical problems. HOME CARE  Take your medicine with a glass of water or  food as told by your doctor.  Take the medicine as told. Finish them even if you start to feel better.  Do not give your medicine to other people.  Do not use your medicine in the future for a different infection.  Ask your doctor about which side effects to watch for.  Try not to miss any doses. If you miss a dose, take  it as soon as possible. If it is almost time for your next dose, and your dosing schedule is:  Two doses a day, take the missed dose and the next dose 5 to 6 hours later.  Three or more doses a day, take the missed dose and the next dose 2 to 4 hours later, or double your next dose.  Then go back to your normal schedule. GET HELP RIGHT AWAY IF:   You get worse or do not get better within a few days.  The medicine makes you sick.  You develop a rash or any other side effects.  You have questions or concerns. MAKE SURE YOU:  Understand these instructions.  Will watch your condition.  Will get help right away if you are not doing well or get worse. Document Released: 06/11/2008 Document Revised: 11/25/2011 Document Reviewed: 08/08/2009 [facility] Patient Information 2015 [place], [place]. This information is not intended to replace advice given to you by your health care provider. Make sure you discuss any questions you have with your health care provider.

## 2015-02-05 NOTE — ED Notes (Signed)
Gildardo GriffesMichael- stepson of patient- contact info [phone].

## 2015-02-05 NOTE — ED Provider Notes (Signed)
CSN: [id number]     Arrival date & time 02/05/15  1512 History   First MD Initiated Contact with Patient 02/05/15 1604     Chief Complaint  Patient presents with  . Urinary Tract Infection   Level V Caveat: Dementia.   [name] is a 81 y.o. female with a history of Alzheimer's dementia, schizophrenia, hypertension, chronic back pain and vertebral compression fracture who presents to the emergency department with her son-in law and caretaker who report she has had "brownish smelly slime" in her urine for 4-5 weeks. I spoke to the patient's daughter who is her power of attorney who reports this brownish discharge that is possibly been going on since February. She reports the patient has been seen by [facility] previously for urinary tract infections but has been treated at home with "natural remedies." They are concerned about the brownish color of her urine. Therefore with her history of Alzheimer's dementia she has been acting at her baseline for the past several months. The patient's primary care provider is Dr. Juanetta Gosling. The caretaker reports she also complains of chronic low back pain due to her vertebral compression fracture. The family denies any falls or recent injuries. The family denies any vomiting, diarrhea, or fevers. The family reports she has been eating and drinking normally.  (Consider location/radiation/quality/duration/timing/severity/associated sxs/prior Treatment) HPI  Past Medical History  Diagnosis Date  . Hypertension   . Reflux   . Schizophrenia   . Alzheimer disease   . Chronic back pain   . Clostridium difficile colitis   . Hiatal hernia   . Vertebral compression fracture    Past Surgical History  Procedure Laterality Date  . Carotid stent    . Tumor of lung -benign , and removed.    . Carotid endarterectomy    . Lung removal, partial     Family History  Problem Relation Age of Onset  . Family history unknown: Yes   History  Substance Use Topics   . Smoking status: Never Smoker   . Smokeless tobacco: Not on file  . Alcohol Use: No   OB History    No data available     Review of Systems  Unable to perform ROS: Dementia  Constitutional: Negative for fever.  Gastrointestinal: Negative for vomiting and diarrhea.  Skin: Negative for rash.      Allergies  Codeine  Home Medications   Prior to Admission medications   Medication Sig Start Date End Date Taking? Authorizing Provider  ketoconazole (NIZORAL) 2 % cream Apply 1 application topically 2 (two) times daily as needed. Applies to both feet up to her ankles 06/06/14  Yes Historical Provider, MD  Multiple Vitamins-Minerals (MULTIVITAMIN & MINERAL PO) Take 15 mLs by mouth daily.   Yes Historical Provider, MD  cephALEXin (KEFLEX) 500 MG capsule Take 1 capsule (500 mg total) by mouth 4 (four) times daily. 02/05/15   Everlene Farrier, PA-C  Lacosamide 10 MG/ML SOLN Take 5 mLs (50 mg total) by mouth 2 (two) times daily. Patient not taking: Reported on 02/05/2015 06/12/14   Christiane Ha, MD   BP 120/93 mmHg  Pulse 77  Temp(Src) 98.4 F (36.9 C) (Axillary)  Resp 20  SpO2 98% Physical Exam  Constitutional: She appears well-developed and well-nourished. No distress.  Nontoxic appearing.  HENT:  Head: Normocephalic and atraumatic.  Mouth/Throat: Oropharynx is clear and moist. No oropharyngeal exudate.  Eyes: Conjunctivae are normal. Pupils are equal, round, and reactive to light. Right eye exhibits no  discharge. Left eye exhibits no discharge.  Neck: Neck supple. No JVD present.  Cardiovascular: Normal rate, regular rhythm, normal heart sounds and intact distal pulses.   Pulmonary/Chest: Effort normal and breath sounds normal. No respiratory distress. She has no wheezes. She has no rales.  Abdominal: Soft. Bowel sounds are normal. She exhibits no distension and no mass. There is no tenderness. There is no rebound and no guarding.  Abdomen is soft and nontender to palpation.  Bowel sounds are present. No guarding present.  Genitourinary: Guaiac negative stool.  Wearing diaper. No external rashes noted on GU exam. No discharge noted.  Digital rectal exam performed by me with female RN chaperone. Patient has multiple external hemorrhoids. No evidence of thrombosed hemorrhoid. No discharge from her rectum. No gross bloody stool.  Musculoskeletal: She exhibits no edema.  No back tenderness, edema, erythema or rashes.   Lymphadenopathy:    She has no cervical adenopathy.  Neurological: She is alert. Coordination normal.  Patient is alert and follows some commands.   Skin: Skin is warm and dry. No rash noted. She is not diaphoretic. No erythema. No pallor.  Psychiatric: She has a normal mood and affect.  Dementia. Patient is alert and follows some commands.   Nursing note and vitals reviewed.   ED Course  Procedures (including critical care time) Labs Review Labs Reviewed  BASIC METABOLIC PANEL - Abnormal; Notable for the following:    Glucose, Bld 109 (*)    BUN 24 (*)    All other components within normal limits  URINALYSIS, ROUTINE W REFLEX MICROSCOPIC - Abnormal; Notable for the following:    APPearance CLOUDY (*)    Hgb urine dipstick SMALL (*)    Nitrite POSITIVE (*)    Leukocytes, UA LARGE (*)    All other components within normal limits  URINE MICROSCOPIC-ADD ON - Abnormal; Notable for the following:    Bacteria, UA FEW (*)    All other components within normal limits  CBC WITH DIFFERENTIAL/PLATELET  POC OCCULT BLOOD, ED    Imaging Review No results found.   EKG Interpretation None      Filed Vitals:   02/05/15 1524 02/05/15 1746 02/05/15 2002  BP: 170/63 196/63 120/93  Pulse: 77 70 77  Temp: 98.4 F (36.9 C)  98.4 F (36.9 C)  TempSrc: Oral  Axillary  Resp: SpO2: 96% 97% 98%     MDM   Meds given in ED:  Medications  sodium chloride 0.9 % bolus 500 mL (0 mLs Intravenous Stopped 02/05/15 1825)  cefTRIAXone (ROCEPHIN)  1 g in dextrose 5 % 50 mL IVPB (0 g Intravenous Stopped 02/05/15 1920)    New Prescriptions   CEPHALEXIN (KEFLEX) 500 MG CAPSULE    Take 1 capsule (500 mg total) by mouth 4 (four) times daily.    Final diagnoses:  UTI (lower urinary tract infection)   This is a 81 y.o. female with a history of Alzheimer's dementia, schizophrenia, hypertension, chronic back pain and vertebral compression fracture who presents to the emergency department with her son-in law and caretaker who report she has had "brownish smelly slime" in her urine for 4-5 weeks. I spoke to the patient's daughter who is her power of attorney who reports this brownish discharge that is possibly been going on since February. She reports that she has been treating it at home with "natural remedies." Family reports that she's been eating and drinking normally. They deny any vomiting or diarrhea. Reports she  has been mentating at her baseline. On exam the patient is afebrile and nontoxic appearing. She is pleasantly demented. Her abdomen is soft and nontender to palpation. There is no evidence of vaginal discharge. On digital rectal exam the patient has multiple external hemorrhoids without evidence of thrombosed hemorrhoid. There is no discharge from her buttocks. No gross bloody stool. Negative Hemoccult. Urinalysis returned nitrite positive with many leukocytes. Urine culture pending. Patient given 1 g rocephin IV in the ED. CBC is within normal limits. BMP is unremarkable with normal kidney function. Plan is for discharge and start patient on Keflex at home.  The patient's son-in-law and caretaker left during the middle of the patient's evaluation. Son-in-law reported he would come back to get her after her evaluation. When I spoke with the patient's daughter who is the power of attorney for the second time she tells me that she is ill and unable to care for the patient at this time. She tells me she wants the patient placed in a SNF tonight  and will not take her back at her home. Initially the patient's son-in-law was called and reported he would be able to care for her and would pick up the patient. When the son-in law did not show up he was called again and did not answer his phone. Spoke again with daughter who refuses to pick up the patient and would like her admitted or placed in a SNF. I explained this was not possible tonight as she does not meet inpatient criteria and could her husband come pick her up. She refuses stating that she needed her husband at her home.  I consulted hospitalist Dr. Onalee Huaavid about possible admission. Plan was to check lactic acid and if normal she would not meet inpatient criteria. The patient's lactic acid returned normal. Will place the patient in ED observation overnight and consult social work in the morning. I've ordered the patient's dose of Keflex in the morning and place her on a soft diet. I also consulted social work to evaluate in the morning. I informed Elpidio AnisShari Upstill, PA-C at shift change about patient plan.   This patient was discussed with and evaluated by Dr. Clarice PolePfeifer who agrees with assessment and plan.    Everlene FarrierWilliam Oryn Casanova, PA-C 02/06/15 0134  Arby BarretteMarcy Pfeiffer, MD 02/07/15 [phone]

## 2015-02-05 NOTE — ED Notes (Signed)
Per family, dysuria possible UTI

## 2015-02-05 NOTE — ED Notes (Signed)
Pt's daughter contacted to pick pt up. Daughter reports that she is sick and that we need to keep the patient in the hospital. Explained to daughter that we do not have a reason to admit the patient and that we will need someone to pick the patient up. Daughter reported that they cannot come and get her and again reports that we need to keep her. Family member very upset and reporting that "we are stressing her out even worse". Consulting civil engineerCharge RN, PA, and ER physician made aware of situation.

## 2015-02-06 DIAGNOSIS — N390 Urinary tract infection, site not specified: Secondary | ICD-10-CM | POA: Diagnosis not present

## 2015-02-06 LAB — LACTIC ACID, PLASMA: Lactic Acid, Venous: 0.6 mmol/L (ref 0.5–2.0)

## 2015-02-06 MED ORDER — NITROFURANTOIN MONOHYD MACRO 100 MG PO CAPS: 100.0000 mg | ORAL_CAPSULE | Freq: Two times a day (BID) | ORAL | Status: DC

## 2015-02-06 MED ORDER — NITROFURANTOIN 25 MG/5ML PO SUSP: 100.0000 mg | Freq: Two times a day (BID) | ORAL | Status: DC

## 2015-02-06 MED ORDER — CEPHALEXIN 500 MG PO CAPS
500.0000 mg | ORAL_CAPSULE | Freq: Once | ORAL | Status: AC
  Administered 2015-02-06: 500 mg via ORAL
  Filled 2015-02-06: qty 1

## 2015-02-06 MED ORDER — NITROFURANTOIN MONOHYD MACRO 100 MG PO CAPS
100.0000 mg | ORAL_CAPSULE | Freq: Two times a day (BID) | ORAL | Status: DC
  Filled 2015-02-06 (×2): qty 1

## 2015-02-06 NOTE — Progress Notes (Addendum)
Case Management Note  Patient Details  Name: JENIAH KISHI MRN: [id number] Date of Birth: 18-Aug-1924  Subjective/Objective:                  81 yr old medicare/medicaid of [place] female pt c/o UTI issues on 02/05/15 history of Alzheimer's dementia, schizophrenia, hypertension, chronic back pain and vertebral compression fracture   Action/Plan:  ED CM consulted by ED SW for home health services to assist with discharge.  Son in law to arrive to pick pt up from [facility] ED.  CM reviewed in details medicare guidelines, home health [facility]) (length of stay in home, types of [facility] staff available, coverage, primary caregiver, up to 24 hrs before services may be started) and Private duty nursing (PDN-coverage, length of stay in the home types of staff available). SW reviewed availability of [place] SW to assist pcp to get pt to snf (if desired disposition) from the community level. CM provided family with a list of [place] home health agencies and PDN.   CM spoke with Cyril Mourning of [facility] to provide referral at 1114  1202 CM & SW met with son in law.  He brought in pt w/c. States pt has a hospital bed and bedside commode at home.  CM spoke with daughter on son in law cell phone about assisting with faxing the ED clinicals to [facility] Fax [phone] UCJA RWPTYY. Daughter states Dr Luan Pulling office is working on [employer] and will give it to daughter to send later.  Explained to Daughter that Dr Luan Pulling has access to [facility] and could also get ED clinicals to send with FL2 to the facility but Daughter states "we have difficulty with getting the office staff to assist" Cm also discussed CM would not be able to give son in law a copy of ED records but can instruct him how to get copy of the records from Medical records Daughter voiced understanding  1222 CM faxed clinicals to [place] [facility] [place] after speaking with her to inform her clinicals to be faxed and Dr Luan Pulling office to fax FL2. Claiborne Rigg  voiced understanding and appreciation for clinicals and call.  Confirmation report received at 1219 for clinicals sent.  Cm notified Son in law as he was leaving main ED area that clinicals sent who voiced appreciation for services rendered   Expected Discharge Date:   02/06/15               Expected Discharge Plan:   home with home health services   In-House Referral:   SW  Discharge planning Services   home health   Post Acute Care Choice:   home health  Choice offered to:   pt and son in law  DME Arranged:   NA DME Agency:   NA  HH Arranged:    [place], SW, PT/OT, aide [place] Agency:   [facility]   Status of Service:   completed  Additional Comments:

## 2015-02-06 NOTE — Progress Notes (Signed)
CSW received call from pt daughter, expressing that her husband was on his way. Patient daughter requested for EDP to contact [facility] to discuss patient care further. CSW provided EDP with contact number [phone].   Olga CoasterKristen Virgal [place], LCSW  Clinical Social Work  [facility] [phone]

## 2015-02-06 NOTE — Clinical Social Work Note (Addendum)
Clinical Social Work Assessment  Patient Details  Name: [name] MRN: [id number] Date of Birth: 08/27/1924  Date of referral:  02/06/15               Reason for consult:  Abuse/Neglect, Facility Placement                Permission sought to share information with:  Family Supports Permission granted to share information::     Name::     Patti Hutcherson   Agency::     Relationship::  Engineer, maintenancedaughter/caregiver  Contact Information:  [phone]  Housing/Transportation Living arrangements for the past 2 months:  Post-Acute Facility Source of Information:  Adult Children Patient Interpreter Needed:  None Criminal Activity/Legal Involvement Pertinent to Current Situation/Hospitalization:  No - Comment as needed Significant Relationships:  Adult Children Lives with:  Adult Children Do you feel safe going back to the place where you live?    Need for family participation in patient care:  No (Coment)  Care giving concerns:  Patient family requesting pt to be placed in skilled nursing facility and are concerned regarding patient UTI.    Social Worker assessment / plan:  CSW attempted to assess patient, however pt still sleeping and has history of alzheimer's dementia. CSW called patient daughter Joycelyn Ruaatti Hutcherson who states she is primary caregiver for patient and POA. CSW and pt daughter discussed current disposition and resources available. At this time, pt is medically stable for discharge home. Patient daughter shared she would like patient to go to a facility. CSW stated that patient would need to follow up with her primary care doctor for orders, however this writer could assist with resources and explain the process for placement from home. Pt daughter states she has been sick however her husband could come and speak with this Clinical research associatewriter and pick up patient at 930am. Pt daughter also expressed concerns regarding patient not being admitted for treatment of UTI. CSW offered patient daughter to  speak with nurse or attending regarding medical treatment and follow up. CSW attempted to have nurse call patient, however pt nurse with another patient at this time. CSW will follow up with RN, and family.   Employment status:  Retired Health and safety inspectornsurance information:  Armed forces operational officerMedicare, Medicaid [place] PT Recommendations:  Not assessed at this time Information / Referral to community resources:  Skilled Nursing Facility  Patient/Family's Response to care:  Patient family are understanding however wanting to have patient placed in a skilled nursing facility. Patient family are open to meeting with CSW to discuss process.   Patient/Family's Understanding of and Emotional Response to Diagnosis, Current Treatment, and Prognosis:  Pt family verbalized understanding of pt current treatment and prognosis however have further questions for nurse. CSW will have rn follow up with patient family.   Emotional Assessment Appearance:  Appears stated age (resting, calm, coopeartive) Attitude/Demeanor/Rapport:  Unable to Assess Affect (typically observed):  Unable to Assess Orientation:  Oriented to Self Alcohol / Substance use:  Other Psych involvement (Current and /or in the community):  No (Comment)  Discharge Needs  Concerns to be addressed:  Discharge Planning Concerns Readmission within the last 30 days:  No Current discharge risk:  None Barriers to Discharge:  Barriers Resolved   Duke SalviaREED, Filomeno Cromley A, LCSW  [phone] 02/06/2015, 8:19 AM

## 2015-02-06 NOTE — Progress Notes (Addendum)
CSW spoke with pt daughter and shared information provided by EDP regarding conversation with urology. Pt son in law on way to [place]. Pt family requested home health services, through advanced home care including rn, pt, aid, and sw.  CSW informed RN CM.   Olga CoasterKristen Pio Eatherly, LCSW  Clinical Social Work  [facility] [phone]

## 2015-02-06 NOTE — Progress Notes (Signed)
CSW and RN CM met with pt son in law at bedside to discuss disposition and resources. CSW provided tips/pointers for snf placement from home if needed. Patient daughter working on getting fl2 from PCP. Patient also to be set up with Home health Services including SW to assist as needed. CSW directed pt and family to apply for special assistance medicaid. Pt son in law thanked csw and rn cm for support and resources.    , LCSW  Clinical Social Work  [facility] [phone]     

## 2015-02-06 NOTE — ED Provider Notes (Signed)
  Physical Exam  BP 187/57 mmHg  Pulse 76  Temp(Src) 98.4 F (36.9 C) (Oral)  Resp 16  SpO2 95%  Physical Exam  ED Course  Procedures  MDM I have discussed with Dr. Berneice HeinrichManny from urology about this patient's care. He finds no reason for admission. We have reviewed previous cultures from the ER and through his office. Antibiotics were changed to Macrobid twice a day under his suggestion. He will be given for 5 days and culture has been sent here.      Benjiman CoreNathan Arles Rumbold, MD 02/06/15 1018

## 2015-02-07 LAB — URINE CULTURE
COLONY COUNT: NO GROWTH
Culture: NO GROWTH

## 2015-02-10 ENCOUNTER — Inpatient Hospital Stay (HOSPITAL_COMMUNITY)
Admission: EM | Admit: 2015-02-10 | Discharge: 2015-02-11 | DRG: 641 | Discharge status: Against Medical Advice | Payer: Medicare Other | Attending: Pulmonary Disease | Admitting: Pulmonary Disease

## 2015-02-10 ENCOUNTER — Encounter (HOSPITAL_COMMUNITY): Payer: Self-pay | Admitting: *Deleted

## 2015-02-10 DIAGNOSIS — E860 Dehydration: Principal | ICD-10-CM | POA: Diagnosis present

## 2015-02-10 DIAGNOSIS — Z79899 Other long term (current) drug therapy: Secondary | ICD-10-CM

## 2015-02-10 DIAGNOSIS — G8929 Other chronic pain: Secondary | ICD-10-CM | POA: Diagnosis present

## 2015-02-10 DIAGNOSIS — I10 Essential (primary) hypertension: Secondary | ICD-10-CM | POA: Diagnosis present

## 2015-02-10 DIAGNOSIS — F0280 Dementia in other diseases classified elsewhere without behavioral disturbance: Secondary | ICD-10-CM | POA: Diagnosis present

## 2015-02-10 DIAGNOSIS — M549 Dorsalgia, unspecified: Secondary | ICD-10-CM | POA: Diagnosis present

## 2015-02-10 DIAGNOSIS — F209 Schizophrenia, unspecified: Secondary | ICD-10-CM | POA: Diagnosis present

## 2015-02-10 DIAGNOSIS — G309 Alzheimer's disease, unspecified: Secondary | ICD-10-CM | POA: Diagnosis present

## 2015-02-10 DIAGNOSIS — K219 Gastro-esophageal reflux disease without esophagitis: Secondary | ICD-10-CM | POA: Diagnosis present

## 2015-02-10 DIAGNOSIS — Z66 Do not resuscitate: Secondary | ICD-10-CM | POA: Diagnosis present

## 2015-02-10 DIAGNOSIS — F0390 Unspecified dementia without behavioral disturbance: Secondary | ICD-10-CM | POA: Diagnosis not present

## 2015-02-10 DIAGNOSIS — R627 Adult failure to thrive: Secondary | ICD-10-CM | POA: Diagnosis not present

## 2015-02-10 LAB — CBC WITH DIFFERENTIAL/PLATELET
Basophils Absolute: 0 10*3/uL (ref 0.0–0.1)
Basophils Relative: 1 % (ref 0–1)
Eosinophils Absolute: 0.1 10*3/uL (ref 0.0–0.7)
Eosinophils Relative: 2 % (ref 0–5)
HEMATOCRIT: 40.5 % (ref 36.0–46.0)
Hemoglobin: 12.9 g/dL (ref 12.0–15.0)
LYMPHS PCT: 25 % (ref 12–46)
Lymphs Abs: 1.5 10*3/uL (ref 0.7–4.0)
MCH: 29.5 pg (ref 26.0–34.0)
MCHC: 31.9 g/dL (ref 30.0–36.0)
MCV: 92.7 fL (ref 78.0–100.0)
Monocytes Absolute: 0.5 10*3/uL (ref 0.1–1.0)
Monocytes Relative: 8 % (ref 3–12)
NEUTROS ABS: 3.9 10*3/uL (ref 1.7–7.7)
NEUTROS PCT: 64 % (ref 43–77)
PLATELETS: 194 10*3/uL (ref 150–400)
RBC: 4.37 MIL/uL (ref 3.87–5.11)
RDW: 13.4 % (ref 11.5–15.5)
WBC: 6 10*3/uL (ref 4.0–10.5)

## 2015-02-10 LAB — BASIC METABOLIC PANEL
Anion gap: 8 (ref 5–15)
BUN: 21 mg/dL — ABNORMAL HIGH (ref 6–20)
CHLORIDE: 103 mmol/L (ref 101–111)
CO2: 30 mmol/L (ref 22–32)
Calcium: 9.2 mg/dL (ref 8.9–10.3)
Creatinine, Ser: 0.69 mg/dL (ref 0.44–1.00)
GFR calc non Af Amer: >60 mL/min (ref >60)
GLUCOSE: 116 mg/dL — AB (ref 65–99)
POTASSIUM: 4.2 mmol/L (ref 3.5–5.1)
Sodium: 141 mmol/L (ref 135–145)

## 2015-02-10 LAB — URINALYSIS, ROUTINE W REFLEX MICROSCOPIC
Bilirubin Urine: NEGATIVE
Glucose, UA: NEGATIVE mg/dL
Ketones, ur: NEGATIVE mg/dL
Leukocytes, UA: NEGATIVE
Nitrite: NEGATIVE
PH: 5.5 (ref 5.0–8.0)
Protein, ur: NEGATIVE mg/dL
Specific Gravity, Urine: <1.005 — ABNORMAL LOW (ref 1.005–1.030)
Urobilinogen, UA: 0.2 mg/dL (ref 0.0–1.0)

## 2015-02-10 LAB — URINE MICROSCOPIC-ADD ON

## 2015-02-10 MED ORDER — SODIUM CHLORIDE 0.9 % IV BOLUS (SEPSIS)
500.0000 mL | Freq: Once | INTRAVENOUS | Status: AC
  Administered 2015-02-10: 500 mL via INTRAVENOUS

## 2015-02-10 MED ORDER — KETOCONAZOLE 2 % EX CREA
1.0000 "application " | TOPICAL_CREAM | Freq: Two times a day (BID) | CUTANEOUS | Status: DC | PRN
  Filled 2015-02-10: qty 15

## 2015-02-10 MED ORDER — ACETAMINOPHEN 650 MG RE SUPP: 650.0000 mg | Freq: Four times a day (QID) | RECTAL | Status: DC | PRN

## 2015-02-10 MED ORDER — SODIUM CHLORIDE 0.9 % IV SOLN
INTRAVENOUS | Status: DC
  Administered 2015-02-11 (×2): via INTRAVENOUS

## 2015-02-10 MED ORDER — NITROFURANTOIN 25 MG/5ML PO SUSP
100.0000 mg | Freq: Two times a day (BID) | ORAL | Status: DC
  Filled 2015-02-10 (×6): qty 20

## 2015-02-10 MED ORDER — ALUM & MAG HYDROXIDE-SIMETH 200-200-20 MG/5ML PO SUSP: 30.0000 mL | Freq: Four times a day (QID) | ORAL | Status: DC | PRN

## 2015-02-10 MED ORDER — MORPHINE SULFATE 2 MG/ML IJ SOLN: 2.0000 mg | INTRAMUSCULAR | Status: DC | PRN

## 2015-02-10 MED ORDER — ACETAMINOPHEN 325 MG PO TABS: 650.0000 mg | ORAL_TABLET | Freq: Four times a day (QID) | ORAL | Status: DC | PRN

## 2015-02-10 MED ORDER — ENOXAPARIN SODIUM 40 MG/0.4ML ~~LOC~~ SOLN
40.0000 mg | SUBCUTANEOUS | Status: DC
  Administered 2015-02-11: 40 mg via SUBCUTANEOUS
  Filled 2015-02-10: qty 0.4

## 2015-02-10 NOTE — ED Provider Notes (Signed)
CSN: [id number]     Arrival date & time 02/10/15  1622 History   First MD Initiated Contact with Patient 02/10/15 1646     Chief Complaint  Patient presents with  . Urinary Tract Infection  . Dehydration     (Consider location/radiation/quality/duration/timing/severity/associated sxs/prior Treatment) HPI.....Marland Kitchen level V caveat for dementia. Patient lives at home with her daughter and has been failing lately. She is not eating or drinking. Similar event was evaluated in the emergency department within the past week and patient was sent home. Family is unable to care for the patient.  Past Medical History  Diagnosis Date  . Hypertension   . Reflux   . Schizophrenia   . Alzheimer disease   . Chronic back pain   . Clostridium difficile colitis   . Hiatal hernia   . Vertebral compression fracture    Past Surgical History  Procedure Laterality Date  . Carotid stent    . Tumor of lung -benign , and removed.    . Carotid endarterectomy    . Lung removal, partial     Family History  Problem Relation Age of Onset  . Family history unknown: Yes   History  Substance Use Topics  . Smoking status: Never Smoker   . Smokeless tobacco: Not on file  . Alcohol Use: No   OB History    No data available     Review of Systems  Unable to perform ROS: Dementia      Allergies  Codeine  Home Medications   Prior to Admission medications   Medication Sig Start Date End Date Taking? Authorizing Provider  ketoconazole (NIZORAL) 2 % cream Apply 1 application topically 2 (two) times daily as needed. Applies to both feet up to her ankles 06/06/14  Yes Historical Provider, MD  Multiple Vitamins-Minerals (MULTIVITAMIN & MINERAL PO) Take 15 mLs by mouth daily.   Yes Historical Provider, MD  nitrofurantoin (FURADANTIN) 25 MG/5ML suspension Take 20 mLs (100 mg total) by mouth 2 (two) times daily. 02/06/15  Yes Benjiman Core, MD  Lacosamide 10 MG/ML SOLN Take 5 mLs (50 mg total) by mouth 2 (two)  times daily. Patient not taking: Reported on 02/05/2015 06/12/14   Christiane Ha, MD   BP 192/72 mmHg  Pulse 90  Temp(Src) 98 F (36.7 C)  Resp 22  Ht  (1.575 m)  Wt 110 lb (49.896 kg)  BMI 20.11 kg/m2  SpO2 95% Physical Exam  Constitutional:  Frail, cachectic, mumbles answers to questions  HENT:  Head: Normocephalic and atraumatic.  Eyes: Conjunctivae are normal.  Neck: Normal range of motion. Neck supple.  Cardiovascular: Normal rate and regular rhythm.   Pulmonary/Chest: Effort normal and breath sounds normal.  Abdominal: Soft. Bowel sounds are normal.  Musculoskeletal:  Unable  Neurological:  Unable  Skin: Skin is warm and dry.  Psychiatric:  Flat affect, demented  Nursing note and vitals reviewed.   ED Course  Procedures (including critical care time) Labs Review Labs Reviewed  BASIC METABOLIC PANEL - Abnormal; Notable for the following:    Glucose, Bld 116 (*)    BUN 21 (*)    All other components within normal limits  URINALYSIS, ROUTINE W REFLEX MICROSCOPIC (NOT AT [facility]) - Abnormal; Notable for the following:    Specific Gravity, Urine <1.005 (*)    Hgb urine dipstick TRACE (*)    All other components within normal limits  CBC WITH DIFFERENTIAL/PLATELET  URINE MICROSCOPIC-ADD ON    Imaging Review No results  found.   EKG Interpretation None      MDM   Final diagnoses:  Failure to thrive in adult  Dehydration    Patient is dehydrated. Will hydrate with IV fluids. Admit to general medicine.    Donnetta HutchingBrian Osa Fogarty, MD 02/10/15 2225

## 2015-02-10 NOTE — ED Notes (Signed)
Pt's family states the pt has dementia, dx with ongoing UTI, pt refuses to take fluids, oral mucosa pale and dry.

## 2015-02-10 NOTE — H&P (Signed)
History and Physical  [name]:[id number] DOB: 1923/11/13 DOA: 02/10/2015  Referring physician: Dr. Adriana Simas, ED physician PCP: Fredirick Maudlin, MD   Chief Complaint: Dehydration  HPI: [name] is a 81 y.o. female  With a history of hypertension, Alzheimer's disease, vertebral compression fracture. She was seen in emergency department of 3 days ago was diagnosed with a urinary tract infection has been on nitrofurantoin. The patient has had a fairly gradual decline over the past couple weeks of decreased oral intake of both food and liquid. The daughter is concerned that she is no longer able to take care of her mother due to the extensive care. She attempted to get the patient hospitalized at [facility] where she was seen 3 days ago, with a goal of nursing home placement. The patient was not admitted at that time. Due to her continued decline, the patient was brought to the emergency department again for concerns of urinary tract infection.   Review of Systems:   No fevers, chills per patient's daughter. As patient has end-stage dementia, the patient is unreliable  Past Medical History  Diagnosis Date  . Hypertension   . Reflux   . Schizophrenia   . Alzheimer disease   . Chronic back pain   . Clostridium difficile colitis   . Hiatal hernia   . Vertebral compression fracture    Past Surgical History  Procedure Laterality Date  . Carotid stent    . Tumor of lung -benign , and removed.    . Carotid endarterectomy    . Lung removal, partial     Social History:  reports that she has never smoked. She does not have any smokeless tobacco history on file. She reports that she does not drink alcohol or use illicit drugs. Patient lives at home & is unable to participate in activities of daily living   Allergies  Allergen Reactions  . Codeine Other (See Comments)    UNKNOWN REACTION    Family History  Problem Relation Age of Onset  . Family history unknown: Yes   daughter with congestive heart failure, hypertension    Prior to Admission medications   Medication Sig Start Date End Date Taking? Authorizing Provider  ketoconazole (NIZORAL) 2 % cream Apply 1 application topically 2 (two) times daily as needed. Applies to both feet up to her ankles 06/06/14  Yes Historical Provider, MD  Multiple Vitamins-Minerals (MULTIVITAMIN & MINERAL PO) Take 15 mLs by mouth daily.   Yes Historical Provider, MD  nitrofurantoin (FURADANTIN) 25 MG/5ML suspension Take 20 mLs (100 mg total) by mouth 2 (two) times daily. 02/06/15  Yes Benjiman Core, MD  Lacosamide 10 MG/ML SOLN Take 5 mLs (50 mg total) by mouth 2 (two) times daily. Patient not taking: Reported on 02/05/2015 06/12/14   Christiane Ha, MD    Physical Exam: BP 192/72 mmHg  Pulse 90  Temp(Src) 98 F (36.7 C)  Resp 22  Ht  (1.575 m)  Wt 49.896 kg (110 lb)  BMI 20.11 kg/m2  SpO2 95%  General: Elderly Caucasian female. Somnolent but restless. No acute cardiopulmonary distress.  Eyes: Pupils equal, round, reactive to light. Extraocular muscles are intact. Sclerae anicteric and noninjected.  ENT: Dry mucosal membranes. No mucosal lesions.  Neck: Neck supple without lymphadenopathy. No carotid bruits. No masses palpated.  Cardiovascular: Regular rate with normal S1-S2 sounds. No murmurs, rubs, gallops auscultated. No JVD.  Respiratory: Good respiratory effort with no wheezes, rales, rhonchi. Lungs clear to auscultation  bilaterally.  Abdomen: Soft, nontender, nondistended. Active bowel sounds. No masses or hepatosplenomegaly  Skin: Dry, warm to touch. 2+ dorsalis pedis and radial pulses. Musculoskeletal: No calf or leg pain. All major joints not erythematous nontender.  Psychiatric: Not able to determine.  Neurologic: No focal neurological deficits. Cranial nerves II through XII are grossly intact.           Labs on Admission:  Basic Metabolic Panel:  Recent Labs Lab 02/05/15 1835  02/10/15 1802  NA 142 141  K 4.0 4.2  CL 105 103  CO2 29 30  GLUCOSE 109* 116*  BUN 24* 21*  CREATININE 0.83 0.69  CALCIUM 8.9 9.2   Liver Function Tests: No results for input(s): AST, ALT, ALKPHOS, BILITOT, PROT, ALBUMIN in the last 168 hours. No results for input(s): LIPASE, AMYLASE in the last 168 hours. No results for input(s): AMMONIA in the last 168 hours. CBC:  Recent Labs Lab 02/05/15 1835 02/10/15 1802  WBC 5.3 6.0  NEUTROABS 3.1 3.9  HGB 12.3 12.9  HCT 39.5 40.5  MCV 91.6 92.7  PLT 175 194   Cardiac Enzymes: No results for input(s): CKTOTAL, CKMB, CKMBINDEX, TROPONINI in the last 168 hours.  BNP (last 3 results) No results for input(s): BNP in the last 8760 hours.  ProBNP (last 3 results) No results for input(s): PROBNP in the last 8760 hours.  CBG: No results for input(s): GLUCAP in the last 168 hours.  Radiological Exams on Admission: No results found.   Assessment/Plan Present on Admission:  . Failure to thrive in adult . Dehydration  This patient was discussed with the ED physician, including pertinent vitals, physical exam findings, labs, and imaging.  We also discussed care given by the ED provider.  #1 failure to thrive an adult #2 dehydration #3 recent urinary tract infection #4 Alzheimer's #5 hypertension  Admit for observation to Dr. Juanetta GoslingHawkins I had a very long conversation with the family regarding the condition of the patient and that her failure to thrive is a complication of the end-stage Alzheimer's disease. I further explained that her condition would likely continue to get worse, particularly as her fluid intake continues to decrease as well as her food intake. I relayed to the family that this is a normal part of the also fibers process that at some point the patient is usually stop eating and drinking fluid. I discussed with the family the possibility of hospice admitting the patient for hospice consult with medical hospitalization  of keeping the patient comfortable. The daughter and her husband are not ready for this step of management at the current time.  As the patient appears to be dehydrated, will admit the patient for observation. Continue IV fluids overnight. Will reevaluate in the morning.  DVT prophylaxis: Lovenox  Consultants: None  Code Status: DO NOT RESUSCITATE  Family Communication: Daughter: Evelene Croonatti Hutchinson: [phone]: [phone]   Disposition Plan: Pending  Time spent: 70 minutes was spent with face-to-face time with patient with at least 50% with counseling and coordination of care  Levie HeritageJacob J Ashante Yellin, DO [facility] Pager [phone]

## 2015-02-11 DIAGNOSIS — F209 Schizophrenia, unspecified: Secondary | ICD-10-CM | POA: Diagnosis present

## 2015-02-11 DIAGNOSIS — Z66 Do not resuscitate: Secondary | ICD-10-CM | POA: Diagnosis present

## 2015-02-11 DIAGNOSIS — F0390 Unspecified dementia without behavioral disturbance: Secondary | ICD-10-CM | POA: Diagnosis not present

## 2015-02-11 DIAGNOSIS — M549 Dorsalgia, unspecified: Secondary | ICD-10-CM | POA: Diagnosis present

## 2015-02-11 DIAGNOSIS — K219 Gastro-esophageal reflux disease without esophagitis: Secondary | ICD-10-CM | POA: Diagnosis present

## 2015-02-11 DIAGNOSIS — G8929 Other chronic pain: Secondary | ICD-10-CM | POA: Diagnosis present

## 2015-02-11 DIAGNOSIS — F0280 Dementia in other diseases classified elsewhere without behavioral disturbance: Secondary | ICD-10-CM | POA: Diagnosis present

## 2015-02-11 DIAGNOSIS — E860 Dehydration: Secondary | ICD-10-CM | POA: Diagnosis not present

## 2015-02-11 DIAGNOSIS — Z79899 Other long term (current) drug therapy: Secondary | ICD-10-CM | POA: Diagnosis not present

## 2015-02-11 DIAGNOSIS — R627 Adult failure to thrive: Secondary | ICD-10-CM | POA: Diagnosis not present

## 2015-02-11 DIAGNOSIS — I10 Essential (primary) hypertension: Secondary | ICD-10-CM | POA: Diagnosis present

## 2015-02-11 DIAGNOSIS — G309 Alzheimer's disease, unspecified: Secondary | ICD-10-CM | POA: Diagnosis present

## 2015-02-11 MED ORDER — ENSURE ENLIVE PO LIQD
237.0000 mL | Freq: Two times a day (BID) | ORAL | Status: DC
  Administered 2015-02-11: 237 mL via ORAL

## 2015-02-11 MED ORDER — MORPHINE SULFATE 2 MG/ML IJ SOLN: 1.0000 mg | INTRAMUSCULAR | Status: DC | PRN

## 2015-02-11 MED ORDER — CETYLPYRIDINIUM CHLORIDE 0.05 % MT LIQD
7.0000 mL | Freq: Two times a day (BID) | OROMUCOSAL | Status: DC
  Administered 2015-02-11: 7 mL via OROMUCOSAL

## 2015-02-11 MED ORDER — NITROFURANTOIN MONOHYD MACRO 100 MG PO CAPS
100.0000 mg | ORAL_CAPSULE | Freq: Two times a day (BID) | ORAL | Status: DC
  Filled 2015-02-11 (×5): qty 1

## 2015-02-11 NOTE — Progress Notes (Signed)
Initial Nutrition Assessment  DOCUMENTATION CODES: Not applicable  INTERVENTION: Magic cup BID with meals, each supplement provides 290 kcal and 9 grams of protein  Recommend MVI  NUTRITION DIAGNOSIS: Inadequate oral intake related to AMS in alzheimer's as evidenced by loss of 8% bw in the past year  GOAL: Patient will meet greater than or equal to 90% of their needs  MONITOR:  PO intake, Supplement acceptance, Labs, I & O's, goals of care, NFPE  REASON FOR ASSESSMENT: Malnutrition Screening Tool  ASSESSMENT: 81 y.o. female PMHx of HTN, Alzheimer's, vertebral compression fracture.  Seen in ED for UTI. The patient has had a fairly gradual decline over the past couple weeks of decreased oral intake of both food and liquid.  Per notes: Daughter and husband not ready to pursue hospice intervention despite pt progressive decline.   Height: Ht Readings from Last 1 Encounters:  02/10/15 5\' 2"  (1.575 m)    Weight: Wt Readings from Last 1 Encounters:  02/10/15 108 lb 3.9 oz (49.1 kg)   Ideal Body Weight:  50 kg  Wt Readings from Last 10 Encounters:  02/10/15 108 lb 3.9 oz (49.1 kg)  06/12/14 105 lb 13.1 oz (48 kg)  04/27/14 117 lb (53.071 kg)  12/24/13 117 lb 1.6 oz (53.116 kg)  12/11/13 112 lb (50.803 kg)  10/02/12 128 lb (58.06 kg)  Pt does not have a UBW anymore, progressive wt loss Loss of ~10 lbs in 1 year-not significant   BMI:  Body mass index is 19.79 kg/(m^2).  Estimated Nutritional Needs: Kcal:  1350-1550 (28-32 kcal/kg) Protein:  49-59 (1-1.2 g/kg) Fluid:  1.5 liters  Skin:  Reviewed, no issues  Diet Order:  DIET SOFT Room service appropriate?: Yes; Fluid consistency:: Pudding Thick  EDUCATION NEEDS: No education needs identified at this time   Intake/Output Summary (Last 24 hours) at 02/11/15 1026 Last data filed at 02/11/15 0910  Gross per 24 hour  Intake      0 ml  Output     13 ml  Net    -13 ml    Last BM: Unknown  Christophe LouisNathan Korrine Sicard RD,  LDN Nutrition Pager: [phone] 02/11/2015 10:26 AM

## 2015-02-11 NOTE — Progress Notes (Signed)
Notified by patients daughter that she wanted to take her mother home at this time because she had found someone to come and take care of her mother. The patients daughter felt that her mother was not prepared for hospice and was not ready to send her mother there. I explained to the patients daughter that she could speak with the doctor about this to clarify her concerns. Patients daughter remained certain that she wanted to take her mother home at this time. MD was made aware. MD stated he did not advise the patient's family to take the patient at this time and that if they were to leave the daughter would have to sign AMA papers. I explained everything in detail to the patients daughter and the daughter reviewed the AMA papers and signed them. Daughter request to take mother home at this time. Patient's IV was removed at this time and patient was dressed to leave with daughter at bedside.  MD is aware.

## 2015-02-11 NOTE — Progress Notes (Signed)
  Yetta Barrelma C Entsminger ZOX:[id number] DOB: 04/20/1924 DOA: 02/10/2015 PCP: Fredirick MaudlinHAWKINS,EDWARD L, MD   Subjective: This demented lady has no complaints. In fact, I don't think she understands my questions. She appears to have severe dementia.           Physical Exam: Blood pressure 143/63, pulse 77, temperature 97.6 F (36.4 C), temperature source Axillary, resp. rate 18, height 5\' 2"  (1.575 m), weight 49.1 kg (108 lb 3.9 oz), SpO2 99 %. She appears slightly dehydrated. She is hemodynamic stable. Heart sounds are present without murmurs or added sounds lung fields are clear. She does appear alert but clearly she is not orientated in time, place or person. There does not appear to be focal neurological signs.   Investigations:  Recent Results (from the past 240 hour(s))  Urine culture     Status: None   Collection Time: 02/05/15  4:27 PM  Result Value Ref Range Status   Specimen Description URINE, RANDOM  Final   Special Requests NONE  Final   Colony Count NO GROWTH Performed at [facility]   Final   Culture NO GROWTH Performed at [facility]   Final   Report Status 02/07/2015 FINAL  Final     Basic Metabolic Panel:  Recent Labs  81/19/1405/27/16 1802  NA 141  K 4.2  CL 103  CO2 30  GLUCOSE 116*  BUN 21*  CREATININE 0.69  CALCIUM 9.2   Liver Function Tests: No results for input(s): AST, ALT, ALKPHOS, BILITOT, PROT, ALBUMIN in the last 72 hours.   CBC:  Recent Labs  02/10/15 1802  WBC 6.0  NEUTROABS 3.9  HGB 12.9  HCT 40.5  MCV 92.7  PLT 194    No results found.    Medications: I have reviewed the patient's current medications.  Impression: 1. Dehydration. Continue with IV fluids. 2. Failure to thrive. This is likely from her worsening dementia. 3. Moderate to severe dementia.       Consultants:  None.   Procedures:  None.   Antibiotics:  None.                   Code Status: DO NOT RESUSCITATE.  Family Communication: No  family members at the bedside.   Disposition Plan: Depending on progress.  Time spent: 15 minutes.     GOSRANI,NIMISH C   02/11/2015, 10:15 AM

## 2015-02-11 NOTE — Progress Notes (Signed)
Encouraged patient to eat breakfast and lunch. Unable to get patient to eat. Also tried to allow the patient to drink some ensure with my help. Will continue to encourage patient to eat.

## 2015-03-16 DIAGNOSIS — N390 Urinary tract infection, site not specified: Secondary | ICD-10-CM | POA: Diagnosis not present

## 2015-03-16 DIAGNOSIS — B9620 Unspecified Escherichia coli [E. coli] as the cause of diseases classified elsewhere: Secondary | ICD-10-CM | POA: Diagnosis not present

## 2015-03-27 DIAGNOSIS — Z87440 Personal history of urinary (tract) infections: Secondary | ICD-10-CM | POA: Diagnosis not present

## 2015-03-27 DIAGNOSIS — R627 Adult failure to thrive: Secondary | ICD-10-CM | POA: Diagnosis not present

## 2015-03-27 DIAGNOSIS — K219 Gastro-esophageal reflux disease without esophagitis: Secondary | ICD-10-CM | POA: Diagnosis not present

## 2015-03-27 DIAGNOSIS — F209 Schizophrenia, unspecified: Secondary | ICD-10-CM | POA: Diagnosis not present

## 2015-03-27 DIAGNOSIS — G309 Alzheimer's disease, unspecified: Secondary | ICD-10-CM | POA: Diagnosis not present

## 2015-03-27 DIAGNOSIS — M4850XD Collapsed vertebra, not elsewhere classified, site unspecified, subsequent encounter for fracture with routine healing: Secondary | ICD-10-CM | POA: Diagnosis not present

## 2015-03-27 DIAGNOSIS — F0280 Dementia in other diseases classified elsewhere without behavioral disturbance: Secondary | ICD-10-CM | POA: Diagnosis not present

## 2015-03-27 DIAGNOSIS — I10 Essential (primary) hypertension: Secondary | ICD-10-CM | POA: Diagnosis not present

## 2015-03-29 DIAGNOSIS — G309 Alzheimer's disease, unspecified: Secondary | ICD-10-CM | POA: Diagnosis not present

## 2015-03-29 DIAGNOSIS — I10 Essential (primary) hypertension: Secondary | ICD-10-CM | POA: Diagnosis not present

## 2015-03-29 DIAGNOSIS — Z87440 Personal history of urinary (tract) infections: Secondary | ICD-10-CM | POA: Diagnosis not present

## 2015-03-29 DIAGNOSIS — K219 Gastro-esophageal reflux disease without esophagitis: Secondary | ICD-10-CM | POA: Diagnosis not present

## 2015-03-29 DIAGNOSIS — F0280 Dementia in other diseases classified elsewhere without behavioral disturbance: Secondary | ICD-10-CM | POA: Diagnosis not present

## 2015-03-29 DIAGNOSIS — R627 Adult failure to thrive: Secondary | ICD-10-CM | POA: Diagnosis not present

## 2015-04-07 DIAGNOSIS — F0280 Dementia in other diseases classified elsewhere without behavioral disturbance: Secondary | ICD-10-CM | POA: Diagnosis not present

## 2015-04-07 DIAGNOSIS — I10 Essential (primary) hypertension: Secondary | ICD-10-CM | POA: Diagnosis not present

## 2015-04-07 DIAGNOSIS — K219 Gastro-esophageal reflux disease without esophagitis: Secondary | ICD-10-CM | POA: Diagnosis not present

## 2015-04-07 DIAGNOSIS — G309 Alzheimer's disease, unspecified: Secondary | ICD-10-CM | POA: Diagnosis not present

## 2015-04-07 DIAGNOSIS — R627 Adult failure to thrive: Secondary | ICD-10-CM | POA: Diagnosis not present

## 2015-04-07 DIAGNOSIS — Z87440 Personal history of urinary (tract) infections: Secondary | ICD-10-CM | POA: Diagnosis not present

## 2015-04-08 DIAGNOSIS — K219 Gastro-esophageal reflux disease without esophagitis: Secondary | ICD-10-CM | POA: Diagnosis not present

## 2015-04-08 DIAGNOSIS — G309 Alzheimer's disease, unspecified: Secondary | ICD-10-CM | POA: Diagnosis not present

## 2015-04-08 DIAGNOSIS — F0280 Dementia in other diseases classified elsewhere without behavioral disturbance: Secondary | ICD-10-CM | POA: Diagnosis not present

## 2015-04-08 DIAGNOSIS — Z87440 Personal history of urinary (tract) infections: Secondary | ICD-10-CM | POA: Diagnosis not present

## 2015-04-08 DIAGNOSIS — I10 Essential (primary) hypertension: Secondary | ICD-10-CM | POA: Diagnosis not present

## 2015-04-08 DIAGNOSIS — R627 Adult failure to thrive: Secondary | ICD-10-CM | POA: Diagnosis not present

## 2015-04-09 DIAGNOSIS — I10 Essential (primary) hypertension: Secondary | ICD-10-CM | POA: Diagnosis not present

## 2015-04-09 DIAGNOSIS — Z87440 Personal history of urinary (tract) infections: Secondary | ICD-10-CM | POA: Diagnosis not present

## 2015-04-09 DIAGNOSIS — F0280 Dementia in other diseases classified elsewhere without behavioral disturbance: Secondary | ICD-10-CM | POA: Diagnosis not present

## 2015-04-09 DIAGNOSIS — R627 Adult failure to thrive: Secondary | ICD-10-CM | POA: Diagnosis not present

## 2015-04-09 DIAGNOSIS — G309 Alzheimer's disease, unspecified: Secondary | ICD-10-CM | POA: Diagnosis not present

## 2015-04-09 DIAGNOSIS — K219 Gastro-esophageal reflux disease without esophagitis: Secondary | ICD-10-CM | POA: Diagnosis not present

## 2015-04-14 DIAGNOSIS — G309 Alzheimer's disease, unspecified: Secondary | ICD-10-CM | POA: Diagnosis not present

## 2015-04-14 DIAGNOSIS — F0280 Dementia in other diseases classified elsewhere without behavioral disturbance: Secondary | ICD-10-CM | POA: Diagnosis not present

## 2015-04-14 DIAGNOSIS — I10 Essential (primary) hypertension: Secondary | ICD-10-CM | POA: Diagnosis not present

## 2015-04-14 DIAGNOSIS — R627 Adult failure to thrive: Secondary | ICD-10-CM | POA: Diagnosis not present

## 2015-04-14 DIAGNOSIS — K219 Gastro-esophageal reflux disease without esophagitis: Secondary | ICD-10-CM | POA: Diagnosis not present

## 2015-04-14 DIAGNOSIS — Z87440 Personal history of urinary (tract) infections: Secondary | ICD-10-CM | POA: Diagnosis not present

## 2015-04-17 DIAGNOSIS — Z87440 Personal history of urinary (tract) infections: Secondary | ICD-10-CM | POA: Diagnosis not present

## 2015-04-17 DIAGNOSIS — M4850XD Collapsed vertebra, not elsewhere classified, site unspecified, subsequent encounter for fracture with routine healing: Secondary | ICD-10-CM | POA: Diagnosis not present

## 2015-04-17 DIAGNOSIS — I10 Essential (primary) hypertension: Secondary | ICD-10-CM | POA: Diagnosis not present

## 2015-04-17 DIAGNOSIS — F209 Schizophrenia, unspecified: Secondary | ICD-10-CM | POA: Diagnosis not present

## 2015-04-17 DIAGNOSIS — F0280 Dementia in other diseases classified elsewhere without behavioral disturbance: Secondary | ICD-10-CM | POA: Diagnosis not present

## 2015-04-17 DIAGNOSIS — R627 Adult failure to thrive: Secondary | ICD-10-CM | POA: Diagnosis not present

## 2015-04-17 DIAGNOSIS — G309 Alzheimer's disease, unspecified: Secondary | ICD-10-CM | POA: Diagnosis not present

## 2015-04-17 DIAGNOSIS — K219 Gastro-esophageal reflux disease without esophagitis: Secondary | ICD-10-CM | POA: Diagnosis not present

## 2015-06-13 DIAGNOSIS — I739 Peripheral vascular disease, unspecified: Secondary | ICD-10-CM | POA: Diagnosis not present

## 2015-12-21 DIAGNOSIS — Z0000 Encounter for general adult medical examination without abnormal findings: Secondary | ICD-10-CM | POA: Diagnosis not present

## 2016-02-23 ENCOUNTER — Other Ambulatory Visit (HOSPITAL_COMMUNITY): Payer: Self-pay | Admitting: Pulmonary Disease

## 2016-02-26 ENCOUNTER — Other Ambulatory Visit (HOSPITAL_COMMUNITY): Payer: Self-pay | Admitting: Pulmonary Disease

## 2016-02-26 DIAGNOSIS — T17928A Food in respiratory tract, part unspecified causing other injury, initial encounter: Secondary | ICD-10-CM

## 2016-02-26 DIAGNOSIS — R630 Anorexia: Secondary | ICD-10-CM

## 2016-02-26 DIAGNOSIS — R1310 Dysphagia, unspecified: Secondary | ICD-10-CM

## 2016-02-26 DIAGNOSIS — R634 Abnormal weight loss: Secondary | ICD-10-CM

## 2016-02-26 DIAGNOSIS — G309 Alzheimer's disease, unspecified: Secondary | ICD-10-CM

## 2016-02-26 DIAGNOSIS — R195 Other fecal abnormalities: Secondary | ICD-10-CM

## 2016-02-26 DIAGNOSIS — Z431 Encounter for attention to gastrostomy: Secondary | ICD-10-CM

## 2016-02-26 DIAGNOSIS — F0280 Dementia in other diseases classified elsewhere without behavioral disturbance: Secondary | ICD-10-CM

## 2016-02-26 DIAGNOSIS — T17920A Food in respiratory tract, part unspecified causing asphyxiation, initial encounter: Secondary | ICD-10-CM

## 2016-02-29 ENCOUNTER — Ambulatory Visit (HOSPITAL_COMMUNITY): Payer: Medicare Other

## 2016-03-04 ENCOUNTER — Ambulatory Visit (HOSPITAL_COMMUNITY): Admission: RE | Admit: 2016-03-04 | Payer: Medicare Other | Source: Ambulatory Visit

## 2016-03-15 ENCOUNTER — Ambulatory Visit (HOSPITAL_COMMUNITY)
Admission: RE | Admit: 2016-03-15 | Discharge: 2016-03-15 | Disposition: A | Discharge status: Home / Self Care | Payer: Medicare Other | Source: Ambulatory Visit | Attending: Pulmonary Disease | Admitting: Pulmonary Disease

## 2016-03-15 DIAGNOSIS — R634 Abnormal weight loss: Secondary | ICD-10-CM

## 2016-03-15 DIAGNOSIS — M8468XA Pathological fracture in other disease, other site, initial encounter for fracture: Secondary | ICD-10-CM | POA: Diagnosis not present

## 2016-03-15 DIAGNOSIS — R195 Other fecal abnormalities: Secondary | ICD-10-CM | POA: Diagnosis not present

## 2016-03-15 DIAGNOSIS — G309 Alzheimer's disease, unspecified: Secondary | ICD-10-CM | POA: Diagnosis not present

## 2016-03-15 DIAGNOSIS — M8588 Other specified disorders of bone density and structure, other site: Secondary | ICD-10-CM | POA: Diagnosis not present

## 2016-03-15 DIAGNOSIS — M419 Scoliosis, unspecified: Secondary | ICD-10-CM | POA: Insufficient documentation

## 2016-03-15 DIAGNOSIS — R630 Anorexia: Secondary | ICD-10-CM

## 2016-03-15 DIAGNOSIS — T17920A Food in respiratory tract, part unspecified causing asphyxiation, initial encounter: Secondary | ICD-10-CM

## 2016-03-15 DIAGNOSIS — M479 Spondylosis, unspecified: Secondary | ICD-10-CM | POA: Insufficient documentation

## 2016-03-15 DIAGNOSIS — T17928A Food in respiratory tract, part unspecified causing other injury, initial encounter: Secondary | ICD-10-CM

## 2016-03-15 DIAGNOSIS — R1310 Dysphagia, unspecified: Secondary | ICD-10-CM | POA: Insufficient documentation

## 2016-03-15 DIAGNOSIS — R933 Abnormal findings on diagnostic imaging of other parts of digestive tract: Secondary | ICD-10-CM | POA: Diagnosis not present

## 2016-03-15 DIAGNOSIS — K449 Diaphragmatic hernia without obstruction or gangrene: Secondary | ICD-10-CM | POA: Diagnosis not present

## 2016-03-15 DIAGNOSIS — I700 Atherosclerosis of aorta: Secondary | ICD-10-CM | POA: Insufficient documentation

## 2016-03-15 DIAGNOSIS — F0280 Dementia in other diseases classified elsewhere without behavioral disturbance: Secondary | ICD-10-CM | POA: Insufficient documentation

## 2016-03-22 ENCOUNTER — Inpatient Hospital Stay (HOSPITAL_COMMUNITY)
Admission: EM | Admit: 2016-03-22 | Discharge: 2016-03-27 | DRG: 470 | Disposition: A | Discharge status: Home Health | Payer: Medicare Other | Attending: Internal Medicine | Admitting: Internal Medicine

## 2016-03-22 ENCOUNTER — Encounter (HOSPITAL_COMMUNITY): Payer: Self-pay | Admitting: Emergency Medicine

## 2016-03-22 ENCOUNTER — Emergency Department (HOSPITAL_COMMUNITY): Payer: Medicare Other

## 2016-03-22 DIAGNOSIS — D62 Acute posthemorrhagic anemia: Secondary | ICD-10-CM | POA: Diagnosis not present

## 2016-03-22 DIAGNOSIS — Z515 Encounter for palliative care: Secondary | ICD-10-CM | POA: Diagnosis present

## 2016-03-22 DIAGNOSIS — F0390 Unspecified dementia without behavioral disturbance: Secondary | ICD-10-CM | POA: Diagnosis present

## 2016-03-22 DIAGNOSIS — K5900 Constipation, unspecified: Secondary | ICD-10-CM | POA: Diagnosis present

## 2016-03-22 DIAGNOSIS — S72001A Fracture of unspecified part of neck of right femur, initial encounter for closed fracture: Secondary | ICD-10-CM

## 2016-03-22 DIAGNOSIS — I10 Essential (primary) hypertension: Secondary | ICD-10-CM | POA: Diagnosis present

## 2016-03-22 DIAGNOSIS — E860 Dehydration: Secondary | ICD-10-CM | POA: Diagnosis not present

## 2016-03-22 DIAGNOSIS — S72001D Fracture of unspecified part of neck of right femur, subsequent encounter for closed fracture with routine healing: Secondary | ICD-10-CM | POA: Diagnosis not present

## 2016-03-22 DIAGNOSIS — G8929 Other chronic pain: Secondary | ICD-10-CM | POA: Diagnosis present

## 2016-03-22 DIAGNOSIS — F0280 Dementia in other diseases classified elsewhere without behavioral disturbance: Secondary | ICD-10-CM | POA: Diagnosis present

## 2016-03-22 DIAGNOSIS — R739 Hyperglycemia, unspecified: Secondary | ICD-10-CM | POA: Diagnosis present

## 2016-03-22 DIAGNOSIS — R1310 Dysphagia, unspecified: Secondary | ICD-10-CM | POA: Diagnosis present

## 2016-03-22 DIAGNOSIS — F209 Schizophrenia, unspecified: Secondary | ICD-10-CM | POA: Diagnosis present

## 2016-03-22 DIAGNOSIS — K219 Gastro-esophageal reflux disease without esophagitis: Secondary | ICD-10-CM | POA: Diagnosis present

## 2016-03-22 DIAGNOSIS — M545 Low back pain: Secondary | ICD-10-CM | POA: Diagnosis not present

## 2016-03-22 DIAGNOSIS — Z66 Do not resuscitate: Secondary | ICD-10-CM | POA: Diagnosis present

## 2016-03-22 DIAGNOSIS — Z79899 Other long term (current) drug therapy: Secondary | ICD-10-CM | POA: Diagnosis not present

## 2016-03-22 DIAGNOSIS — Y92009 Unspecified place in unspecified non-institutional (private) residence as the place of occurrence of the external cause: Secondary | ICD-10-CM

## 2016-03-22 DIAGNOSIS — S72041A Displaced fracture of base of neck of right femur, initial encounter for closed fracture: Secondary | ICD-10-CM | POA: Diagnosis not present

## 2016-03-22 DIAGNOSIS — S299XXA Unspecified injury of thorax, initial encounter: Secondary | ICD-10-CM | POA: Diagnosis not present

## 2016-03-22 DIAGNOSIS — G309 Alzheimer's disease, unspecified: Secondary | ICD-10-CM | POA: Diagnosis not present

## 2016-03-22 DIAGNOSIS — M25551 Pain in right hip: Secondary | ICD-10-CM | POA: Diagnosis not present

## 2016-03-22 DIAGNOSIS — M810 Age-related osteoporosis without current pathological fracture: Secondary | ICD-10-CM | POA: Diagnosis present

## 2016-03-22 DIAGNOSIS — W050XXA Fall from non-moving wheelchair, initial encounter: Secondary | ICD-10-CM | POA: Diagnosis present

## 2016-03-22 DIAGNOSIS — R259 Unspecified abnormal involuntary movements: Secondary | ICD-10-CM | POA: Diagnosis not present

## 2016-03-22 DIAGNOSIS — S72011A Unspecified intracapsular fracture of right femur, initial encounter for closed fracture: Secondary | ICD-10-CM | POA: Diagnosis present

## 2016-03-22 DIAGNOSIS — S72009A Fracture of unspecified part of neck of unspecified femur, initial encounter for closed fracture: Secondary | ICD-10-CM | POA: Diagnosis present

## 2016-03-22 DIAGNOSIS — W19XXXA Unspecified fall, initial encounter: Secondary | ICD-10-CM

## 2016-03-22 LAB — BASIC METABOLIC PANEL
ANION GAP: 8 (ref 5–15)
BUN: 25 mg/dL — ABNORMAL HIGH (ref 6–20)
CALCIUM: 9.2 mg/dL (ref 8.9–10.3)
CO2: 25 mmol/L (ref 22–32)
CREATININE: 0.53 mg/dL (ref 0.44–1.00)
Chloride: 99 mmol/L — ABNORMAL LOW (ref 101–111)
Glucose, Bld: 173 mg/dL — ABNORMAL HIGH (ref 65–99)
Potassium: 4.2 mmol/L (ref 3.5–5.1)
SODIUM: 132 mmol/L — AB (ref 135–145)

## 2016-03-22 LAB — CBC WITH DIFFERENTIAL/PLATELET
BASOS ABS: 0 10*3/uL (ref 0.0–0.1)
Basophils Relative: 0 %
EOS ABS: 0.1 10*3/uL (ref 0.0–0.7)
Eosinophils Relative: 1 %
HEMATOCRIT: 38.6 % (ref 36.0–46.0)
Hemoglobin: 12.9 g/dL (ref 12.0–15.0)
LYMPHS ABS: 0.5 10*3/uL — AB (ref 0.7–4.0)
Lymphocytes Relative: 6 %
MCH: 30.1 pg (ref 26.0–34.0)
MCHC: 33.4 g/dL (ref 30.0–36.0)
MCV: 90.2 fL (ref 78.0–100.0)
MONOS PCT: 9 %
Monocytes Absolute: 0.8 10*3/uL (ref 0.1–1.0)
Neutro Abs: 7.5 10*3/uL (ref 1.7–7.7)
Neutrophils Relative %: 84 %
Platelets: DECREASED 10*3/uL (ref 150–400)
RBC: 4.28 MIL/uL (ref 3.87–5.11)
RDW: 12.7 % (ref 11.5–15.5)
WBC: 8.9 10*3/uL (ref 4.0–10.5)

## 2016-03-22 MED ORDER — ADULT MULTIVITAMIN W/MINERALS CH
1.0000 | ORAL_TABLET | Freq: Every day | ORAL | Status: DC
  Administered 2016-03-25 – 2016-03-27 (×3): 1 via ORAL
  Filled 2016-03-22 (×3): qty 1

## 2016-03-22 MED ORDER — ENOXAPARIN SODIUM 40 MG/0.4ML ~~LOC~~ SOLN: 40.0000 mg | SUBCUTANEOUS | Status: DC

## 2016-03-22 MED ORDER — KETOCONAZOLE 2 % EX CREA
1.0000 "application " | TOPICAL_CREAM | Freq: Every day | CUTANEOUS | Status: DC
  Administered 2016-03-23 – 2016-03-27 (×3): 1 via TOPICAL
  Filled 2016-03-22 (×2): qty 15

## 2016-03-22 MED ORDER — ONDANSETRON HCL 4 MG/2ML IJ SOLN: 4.0000 mg | Freq: Four times a day (QID) | INTRAMUSCULAR | Status: DC | PRN

## 2016-03-22 MED ORDER — SODIUM CHLORIDE 0.9 % IV SOLN
INTRAVENOUS | Status: AC
  Administered 2016-03-22 – 2016-03-25 (×2): via INTRAVENOUS

## 2016-03-22 MED ORDER — HYDROCODONE-ACETAMINOPHEN 5-325 MG PO TABS: 1.0000 | ORAL_TABLET | Freq: Four times a day (QID) | ORAL | Status: DC | PRN

## 2016-03-22 MED ORDER — MORPHINE SULFATE (PF) 2 MG/ML IV SOLN: 0.5000 mg | INTRAVENOUS | Status: DC | PRN

## 2016-03-22 NOTE — ED Provider Notes (Signed)
CSN: [id number]     Arrival date & time 03/22/16  2001 History   First MD Initiated Contact with Patient 03/22/16 2122     Chief Complaint  Patient presents with  . Fall    LEVEL 5 CAVEAT SECONDARY TO DEMENTIA  (Consider location/radiation/quality/duration/timing/severity/associated sxs/prior Treatment) HPI Comments: 82 year old female with a history of hypertension, coronary artery disease, Alzheimer's dementia, and schizophrenia presents to the emergency department after a fall 2 days ago. Daughter states that patient scooted out of her chair, falling on her left hip. Patient had no head trauma or loss of consciousness. Daughter states that patient seemed fine immediately following the fall. She was smiling and engaging with the daughter. Daughter states that she noticed that the patient appeared to be uncomfortable today, as though her right leg were sore. The patient does take tramadol liquid at baseline which the daughter has been giving for pain. Daughter denies any recent fevers.   Patient is exclusively on a pured/liquid diet. Per the daughter, the patient is an aspiration risk in the daughter has had difficulty providing the patient adequate nutrients by mouth. She was evaluated by radiology to see if she would be a candidate for a feeding tube; per daughter, this is an ongoing discussion with GI and the patient's PCP.  Patient is a 82 y.o. female presenting with fall. The history is provided by a relative. No language interpreter was used.  Fall    Past Medical History  Diagnosis Date  . Hypertension   . Reflux   . Schizophrenia (HCC)   . Alzheimer disease   . Chronic back pain   . Clostridium difficile colitis   . Hiatal hernia   . Vertebral compression fracture [facility](HCC)    Past Surgical History  Procedure Laterality Date  . Carotid stent    . Tumor of lung -benign , and removed.    . Carotid endarterectomy    . Lung removal, partial     Family History  Problem Relation  Age of Onset  . Family history unknown: Yes   Social History  Substance Use Topics  . Smoking status: Never Smoker   . Smokeless tobacco: None  . Alcohol Use: No   OB History    No data available      Review of Systems  Unable to perform ROS: Dementia    Allergies  Codeine  Home Medications   Prior to Admission medications   Medication Sig Start Date End Date Taking? Authorizing Provider  ketoconazole (NIZORAL) 2 % cream Apply 1 application topically 2 (two) times daily as needed. Applies to both feet up to her ankles 06/06/14   Historical Provider, MD  Lacosamide 10 MG/ML SOLN Take 5 mLs (50 mg total) by mouth 2 (two) times daily. Patient not taking: Reported on 02/05/2015 06/12/14   Christiane Haorinna L Sullivan, MD  Multiple Vitamins-Minerals (MULTIVITAMIN & MINERAL PO) Take 15 mLs by mouth daily.    Historical Provider, MD  nitrofurantoin (FURADANTIN) 25 MG/5ML suspension Take 20 mLs (100 mg total) by mouth 2 (two) times daily. 02/06/15   Benjiman CoreNathan Pickering, MD   BP 168/102 mmHg  Pulse 96  Temp(Src) 98.9 F (37.2 C) (Axillary)  Resp 16  SpO2 96%   Physical Exam  Constitutional: She appears well-developed. No distress.  Thin, frail appearing  HENT:  Head: Normocephalic and atraumatic.  Eyes: Conjunctivae and EOM are normal.  Cardiovascular: Normal rate, regular rhythm and intact distal pulses.   DP and PT pulses 2+ in the RLE  Pulmonary/Chest: Effort normal. No respiratory distress. She has no wheezes.  Lungs CTAB. Respirations even and unlabored  Musculoskeletal:  Mild right leg shortening. Slight external rotation.  Skin: Skin is warm and dry. No rash noted. She is not diaphoretic. No erythema. No pallor.  Psychiatric: She has a normal mood and affect. Her behavior is normal.  Nursing note and vitals reviewed.   ED Course  Procedures (including critical care time) Labs Review Labs Reviewed  BASIC METABOLIC PANEL - Abnormal; Notable for the following:    Sodium 132 (*)     Chloride 99 (*)    Glucose, Bld 173 (*)    BUN 25 (*)    All other components within normal limits  CBC WITH DIFFERENTIAL/PLATELET    Imaging Review Dg Chest 1 View  03/22/2016  CLINICAL DATA:  Fall, right leg pain EXAM: CHEST 1 VIEW COMPARISON:  06/09/2014 FINDINGS: Right basilar scarring/ atelectasis. No pleural effusion or pneumothorax. Cardiomegaly. Degenerative changes the visualized thoracolumbar spine. IMPRESSION: No evidence of acute cardiopulmonary disease. Electronically Signed   By: Charline BillsSriyesh  Krishnan M.D.   On: 03/22/2016 21:24   Dg Hip Unilat  With Pelvis 2-3 Views Right  03/22/2016  CLINICAL DATA:  Fall, right leg pain EXAM: DG HIP (WITH OR WITHOUT PELVIS) 2-3V RIGHT COMPARISON:  None. FINDINGS: Subcapital right hip fracture.  Foreshortening with varus deformity. Visualized bony pelvis appears intact. IMPRESSION: Subcapital right hip fracture, as above. Electronically Signed   By: Charline BillsSriyesh  Krishnan M.D.   On: 03/22/2016 21:23   Dg Femur, Min 2 Views Right  03/22/2016  CLINICAL DATA:  Fall, right leg pain EXAM: RIGHT FEMUR 2 VIEWS COMPARISON:  None. FINDINGS: Subcapital right hip fracture.  Foreshortening with varus deformity. Visualized bony pelvis appears intact. Distal femur appears intact. IMPRESSION: Subcapital right hip fracture, as above. Electronically Signed   By: Charline BillsSriyesh  Krishnan M.D.   On: 03/22/2016 21:23   I have personally reviewed and evaluated these images and lab results as part of my medical decision-making.   EKG Interpretation   Date/Time:  Friday March 22 2016 22:15:05 EDT Ventricular Rate:  93 PR Interval:    QRS Duration: 103 QT Interval:  339 QTC Calculation: 422 R Axis:   45 Text Interpretation:  sinus rhythm with PACs No significant change since  last tracing Confirmed by [facility]  MD, MARTHA [phone] on 03/22/2016 10:30:18  PM      MDM   Final diagnoses:  Subcapital fracture of femur, right, closed, initial encounter [facility](HCC)    Patient with  right subcapital femur fracture, suspected to be from a fall 2 days ago. No head trauma. No LOC. Patient neurovascularly intact. Dr. Victorino DikeHewitt made aware; anticipates surgery on Sunday. [facility]. Katrinka BlazingSmith to admit.     Antony MaduraKelly Rawad Bochicchio, PA-C 03/22/16 2314  Jerelyn ScottMartha Linker, MD 03/22/16 [id number]

## 2016-03-22 NOTE — ED Notes (Signed)
Kelly H. PA at bedside  

## 2016-03-22 NOTE — ED Notes (Signed)
Provider at bedside

## 2016-03-22 NOTE — ED Notes (Signed)
Pt has a large purple and black bruise on the top of her left foot. The pts daughter states that she did not see the bruise on the pts foot this morning.

## 2016-03-22 NOTE — ED Notes (Addendum)
PT IS ON THICKENED LIQUIDS AND PUREED FOODS ONLY, PT IS ASPIRATION RISK PER DAUGHTER.

## 2016-03-22 NOTE — ED Notes (Signed)
Per pt daughter, pt scooted out of her chair and fell on her L side on carpet two days ago. Immediately after fall, pt seemed fine, was laughing and smiling. Today pt acting like RIGHT leg is sore. Pt is confused per baseline

## 2016-03-22 NOTE — H&P (Signed)
History and Physical    Deborah Barrelma C Hubka ZOX:[id number] DOB: 08/13/1924 DOA: 03/22/2016  Referring MD/NP/PA:  Antony MaduraKelly Humes PA-C PCP: Fredirick MaudlinHAWKINS,EDWARD L, MD  Patient coming from: home  Chief Complaint:  fall  HPI: [name] is a 82 y.o. female with medical history significant of HTN, Alzheimer's dementia, osteoporosis, dysphagia; who presents after having a fall at home 2 days ago. History is obtained from the patient's daughter [name] due to the patient's history of dementia. Apparently 2 days ago, the patient who normally ambulates in a wheelchair scooted out of the chair falling on her  hip. Patient was not noted to have suffered any head trauma or loss consciousness. Patient appeared to be fine and showed no signs of discomfort initially. However, today patient seemed more uncomfortable while sitting in her wheelchair in the program for further evaluation. Associated symptoms include history of difficulty swallowing for which the patient has been on a pured and thick liquid diet. They have been having difficulty keeping her hydrated and she's not been really wanting to eat much. They're in the process of trying to see if she is a candidate get a feeding tube gastroenterology. The daughter feels that the patient is a significant aspiration risk. Daughter also notes that she has significant history of osteoporosis and wonders if it is appropriate for her to have a surgical procedure for this injury.  ED Course: Upon admission to the emergency department patient was evaluated and seen to be afebrile with vital signs otherwise within normal limits except for blood pressure seen to be as high as 177/103. Lab work revealed sodium 132, potassium 4.2, chloride 99, CO2 25, BUN 25, creatinine 0.53, glucose 173. X-rays revealed a right subcapital femur fracture. Dr. Victorino DikeHewitt of orthopedics was consulted and recommended admitting the patient for possible surgical correction if medically cleared.   Review of  Systems:  Unable to assess secondary to dementia .  Past Medical History  Diagnosis Date  . Hypertension   . Reflux   . Schizophrenia (HCC)   . Alzheimer disease   . Chronic back pain   . Clostridium difficile colitis   . Hiatal hernia   . Vertebral compression fracture [facility](HCC)     Past Surgical History  Procedure Laterality Date  . Carotid stent    . Tumor of lung -benign , and removed.    . Carotid endarterectomy    . Lung removal, partial       reports that she has never smoked. She does not have any smokeless tobacco history on file. She reports that she does not drink alcohol or use illicit drugs.  Allergies  Allergen Reactions  . Codeine Other (See Comments)    UNKNOWN REACTION    Family History  Problem Relation Age of Onset  . Family history unknown: Yes    Prior to Admission medications   Medication Sig Start Date End Date Taking? Authorizing Provider  ketoconazole (NIZORAL) 2 % cream Apply 1 application topically daily. Under breasts for yeast 06/06/14  Yes Historical Provider, MD  Multiple Vitamin (MULTIVITAMIN) capsule Take 1 capsule by mouth daily. Put in applesauce or shakes   Yes Historical Provider, MD  traMADol (ULTRAM) 5 mg/mL SUSP Take 12.5-20 mg by mouth every 4 (four) hours as needed for moderate pain.   Yes Historical Provider, MD    Physical Exam: Constitutional: Frail elderly female who appears pleasantly confused  Filed Vitals:   03/22/16 2011 03/22/16 2143  BP: 168/102 177/103  Pulse: 96 85  Temp: 98.9 F (37.2 C)   TempSrc: Axillary   Resp: 16 15  SpO2: 96% 98%   Eyes: PERRL, lids and conjunctivae normal ENMT: Mucous membranes are dry.  Posterior pharynx clear of any exudate or lesions.Normal dentition.  Neck: normal, supple, no masses, no thyromegaly Respiratory: clear to auscultation bilaterally, no wheezing, no crackles. Normal respiratory effort. No accessory muscle use.  Cardiovascular: Regular rate and rhythm, no murmurs / rubs /  gallops. No extremity edema. 2+ pedal pulses. No carotid bruits.  Abdomen: no tenderness, no masses palpated. No hepatosplenomegaly. Bowel sounds positive.  Musculoskeletal: no clubbing / cyanosis. Right leg shortened and externally rotated ROM, no contractures.  Skin: no rashes, lesions, ulcers. No induration Neurologic: CN 2-12 grossly intact. Sensation intact, DTR normal. Strength 5/5 in all 4.  Psychiatric:  Dementia who appears to be pleasantly confused.     Labs on Admission: I have personally reviewed following labs and imaging studies  CBC:  Recent Labs Lab 03/22/16 2221  WBC PENDING  NEUTROABS PENDING  HGB 12.9  HCT 38.6  MCV 90.2  PLT PENDING   Basic Metabolic Panel:  Recent Labs Lab 03/22/16 2221  NA 132*  K 4.2  CL 99*  CO2 25  GLUCOSE 173*  BUN 25*  CREATININE 0.53  CALCIUM 9.2   GFR: CrCl cannot be calculated (Unknown ideal weight.). Liver Function Tests: No results for input(s): AST, ALT, ALKPHOS, BILITOT, PROT, ALBUMIN in the last 168 hours. No results for input(s): LIPASE, AMYLASE in the last 168 hours. No results for input(s): AMMONIA in the last 168 hours. Coagulation Profile: No results for input(s): INR, PROTIME in the last 168 hours. Cardiac Enzymes: No results for input(s): CKTOTAL, CKMB, CKMBINDEX, TROPONINI in the last 168 hours. BNP (last 3 results) No results for input(s): PROBNP in the last 8760 hours. HbA1C: No results for input(s): HGBA1C in the last 72 hours. CBG: No results for input(s): GLUCAP in the last 168 hours. Lipid Profile: No results for input(s): CHOL, HDL, LDLCALC, TRIG, CHOLHDL, LDLDIRECT in the last 72 hours. Thyroid Function Tests: No results for input(s): TSH, T4TOTAL, FREET4, T3FREE, THYROIDAB in the last 72 hours. Anemia Panel: No results for input(s): VITAMINB12, FOLATE, FERRITIN, TIBC, IRON, RETICCTPCT in the last 72 hours. Urine analysis:    Component Value Date/Time   COLORURINE YELLOW 02/10/2015 1948     APPEARANCEUR CLEAR 02/10/2015 1948   LABSPEC <1.005* 02/10/2015 1948   PHURINE 5.5 02/10/2015 1948   GLUCOSEU NEGATIVE 02/10/2015 1948   HGBUR TRACE* 02/10/2015 1948   BILIRUBINUR NEGATIVE 02/10/2015 1948   KETONESUR NEGATIVE 02/10/2015 1948   PROTEINUR NEGATIVE 02/10/2015 1948   UROBILINOGEN 0.2 02/10/2015 1948   NITRITE NEGATIVE 02/10/2015 1948   LEUKOCYTESUR NEGATIVE 02/10/2015 1948   Sepsis Labs: No results found for this or any previous visit (from the past 240 hour(s)).   Radiological Exams on Admission: Dg Chest 1 View  03/22/2016  CLINICAL DATA:  Fall, right leg pain EXAM: CHEST 1 VIEW COMPARISON:  06/09/2014 FINDINGS: Right basilar scarring/ atelectasis. No pleural effusion or pneumothorax. Cardiomegaly. Degenerative changes the visualized thoracolumbar spine. IMPRESSION: No evidence of acute cardiopulmonary disease. Electronically Signed   By: Charline Bills M.D.   On: 03/22/2016 21:24   Dg Hip Unilat  With Pelvis 2-3 Views Right  03/22/2016  CLINICAL DATA:  Fall, right leg pain EXAM: DG HIP (WITH OR WITHOUT PELVIS) 2-3V RIGHT COMPARISON:  None. FINDINGS: Subcapital right hip fracture.  Foreshortening with varus deformity. Visualized bony pelvis appears intact.  IMPRESSION: Subcapital right hip fracture, as above. Electronically Signed   By: Charline BillsSriyesh  Krishnan M.D.   On: 03/22/2016 21:23   Dg Femur, Min 2 Views Right  03/22/2016  CLINICAL DATA:  Fall, right leg pain EXAM: RIGHT FEMUR 2 VIEWS COMPARISON:  None. FINDINGS: Subcapital right hip fracture.  Foreshortening with varus deformity. Visualized bony pelvis appears intact. Distal femur appears intact. IMPRESSION: Subcapital right hip fracture, as above. Electronically Signed   By: Charline BillsSriyesh  Krishnan M.D.   On: 03/22/2016 21:23    EKG: Independently reviewed. Sinus rhythm  Assessment/Plan Right subcapital hip fracture secondary to fall from wheelchair: Acute.Patient with fall from wheelchair to days ago. X-ray showing acute  right hip fracture. Dr. Victorino DikeHewitt, Orthopedics question the possibility of taking patient to surgery on Sunday if medically cleared.  - Admit to a MedSurg bed  - Hip fracture protocol initiated - IV morphine prn pain  - Patient's daughter has concerns regarding possible surgery procedure - Follow-up with Orthopedics recommendations.   Dysphagia: Patient noted to be a high aspiration risk per daughter currently the third with a pured and thick liquid diet. Family and processed trying to see if patient can have a PEG tube placed.  - Check formal swallow eval in a.m. with speech therapy     Dehydration: As seen by the elevated BUN to creatinine ratio 25: 0.53. - IV fluids normal saline at 75 ml/hour   Essential hypertension: Blood pressures seen as high as 189/95 on admission. Suspect some aspect of pain contributing to elevated blood pressures. -   IV hydralazine 5mg   as needed for sBP>180  Hyperglycemia: Blood glucose noted to be 173 on admission - Continue to monitor and add on a sliding scale insulin if needed   Dementia: Stable  DVT prophylaxis: lovenox  Code Status: DNR Family Communication: This plan with the patient's daughter [name] Disposition Plan: To be determined Consults called: Orthopedics Admission status: Inpatient MedSurg  Clydie Braunondell A Smith MD [facility] Pager [phone]  If 7PM-7AM, please contact night-coverage [email or web address] Password [email or web address]  03/22/2016, 11:09 PM

## 2016-03-22 NOTE — ED Notes (Signed)
Dr. Smith at bedside.

## 2016-03-22 NOTE — Consult Note (Signed)
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 female.  HPI: 82 y/o female with PMH of dementia fell yesterday at her daughter's home landing on her left side.  She initially didn't appear to be injured but showed increasing signs of pain on the right side over the last day.  She was brought to the ER where Xrays show a right femoral neck fracture.  She has no h/o diabetes.  She doesn't smoker.  She takes no blood thinners.  She doesn't walk independently.  She doesn't communicate verbally.  Past Medical History  Diagnosis Date  . Hypertension   . Reflux   . Schizophrenia ([place])   . Alzheimer disease   . Chronic back pain   . Clostridium difficile colitis   . Hiatal hernia   . Vertebral compression fracture [facility])     Past Surgical History  Procedure Laterality Date  . Carotid stent    . Tumor of lung -benign , and removed.    . Carotid endarterectomy    . Lung removal, partial      Family History  Problem Relation Age of Onset  . Family history unknown: Yes    Social History:  reports that she has never smoked. She does not have any smokeless tobacco history on file. She reports that she does not drink alcohol or use illicit drugs.  Allergies:  Allergies  Allergen Reactions  . Codeine Other (See Comments)    UNKNOWN REACTION    Medications: I have reviewed the patient's current medications.  Results for orders placed or performed during the hospital encounter of 03/22/16 (from the past 48 hour(s))  CBC with Differential     Status: None (Preliminary result)   Collection Time: 03/22/16 10:21 PM  Result Value Ref Range   WBC PENDING 4.0 - 10.5 K/uL   RBC 4.28 3.87 - 5.11 MIL/uL   Hemoglobin 12.9 12.0 - 15.0 g/dL   HCT 38.6 36.0 - 46.0 %   MCV 90.2 78.0 - 100.0 fL   MCH 30.1 26.0 - 34.0 pg   MCHC 33.4 30.0 - 36.0 g/dL   RDW 12.7 11.5 - 15.5 %   Platelets PENDING 150 - 400 K/uL   Neutrophils Relative % PENDING %   Neutro Abs PENDING 1.7 - 7.7 K/uL   Band Neutrophils PENDING %   Lymphocytes Relative PENDING %   Lymphs Abs PENDING 0.7 - 4.0 K/uL   Monocytes Relative PENDING %   Monocytes Absolute PENDING 0.1 - 1.0 K/uL   Eosinophils Relative PENDING %   Eosinophils Absolute PENDING 0.0 - 0.7 K/uL   Basophils Relative PENDING %   Basophils Absolute PENDING 0.0 - 0.1 K/uL   WBC Morphology PENDING    RBC Morphology PENDING    Smear Review PENDING    nRBC PENDING 0 /100 WBC   Metamyelocytes Relative PENDING %   Myelocytes PENDING %   Promyelocytes Absolute PENDING %   Blasts PENDING %  Basic metabolic panel     Status: Abnormal   Collection Time: 03/22/16 10:21 PM  Result Value Ref Range   Sodium 132 (L) 135 - 145 mmol/L   Potassium 4.2 3.5 - 5.1 mmol/L   Chloride 99 (L) 101 - 111 mmol/L   CO2 25 22 - 32 mmol/L   Glucose, Bld 173 (H) 65 - 99 mg/dL   BUN 25 (H) 6 - 20 mg/dL   Creatinine, Ser 0.53 0.44 - 1.00 mg/dL  Calcium 9.2 8.9 - 10.3 mg/dL   GFR calc non Af Amer >60 >60 mL/min   GFR calc Af Amer >60 >60 mL/min    Comment: (NOTE) The eGFR has been calculated using the CKD EPI equation. This calculation has not been validated in all clinical situations. eGFR's persistently <60 mL/min signify possible Chronic Kidney Disease.    Anion gap 8 5 - 15    Dg Chest 1 View  03/22/2016  CLINICAL DATA:  Fall, right leg pain EXAM: CHEST 1 VIEW COMPARISON:  06/09/2014 FINDINGS: Right basilar scarring/ atelectasis. No pleural effusion or pneumothorax. Cardiomegaly. Degenerative changes the visualized thoracolumbar spine. IMPRESSION: No evidence of acute cardiopulmonary disease. Electronically Signed   By: Julian Hy M.D.   On: 03/22/2016 21:24   Dg Hip Unilat  With Pelvis 2-3 Views Right  03/22/2016  CLINICAL DATA:  Fall, right leg pain EXAM: DG HIP (WITH OR WITHOUT PELVIS) 2-3V RIGHT COMPARISON:  None. FINDINGS: Subcapital right hip fracture.  Foreshortening with varus deformity. Visualized  bony pelvis appears intact. IMPRESSION: Subcapital right hip fracture, as above. Electronically Signed   By: Julian Hy M.D.   On: 03/22/2016 21:23   Dg Femur, Min 2 Views Right  03/22/2016  CLINICAL DATA:  Fall, right leg pain EXAM: RIGHT FEMUR 2 VIEWS COMPARISON:  None. FINDINGS: Subcapital right hip fracture.  Foreshortening with varus deformity. Visualized bony pelvis appears intact. Distal femur appears intact. IMPRESSION: Subcapital right hip fracture, as above. Electronically Signed   By: Julian Hy M.D.   On: 03/22/2016 21:23    ROS:  No recent f/c/n/v/wt loss PE:  Blood pressure 177/103, pulse 85, temperature 98.9 F (37.2 C), temperature source Axillary, resp. rate 15, SpO2 98 %. Thin cachectic appearing elderly female in nad.  Sleepy and rouses with some difficulty.  HOH.  EOMI.  Resp unlabored.  R hip with intact skin.  Slight shortening of the R LE.  No lymphadenopathy.  2+ DP pulse.  5/5 strength in PF adn DF of the ankle.  Sens to LT can't be tested.  Assessment/Plan: R femoral neck fracture - displaced and closed.  I explained the nature of the injury and the treatment options to the patient's daughter in detail.  If the patient can safely go to surgery, the daughter would like to proceed with surgical treatment.  We'll await pre op eval by the admitting Hospitalist and plan surgical treatment for Sunday morning if she is cleared.  Patient's daughter understands the plan and agrees.  Continue NWB on the R LE.  Hold blood thinners.  Wylene Simmer 03/22/2016, 10:59 PM

## 2016-03-23 DIAGNOSIS — W050XXD Fall from non-moving wheelchair, subsequent encounter: Secondary | ICD-10-CM

## 2016-03-23 DIAGNOSIS — W050XXA Fall from non-moving wheelchair, initial encounter: Secondary | ICD-10-CM | POA: Diagnosis present

## 2016-03-23 DIAGNOSIS — R739 Hyperglycemia, unspecified: Secondary | ICD-10-CM | POA: Diagnosis present

## 2016-03-23 DIAGNOSIS — R1310 Dysphagia, unspecified: Secondary | ICD-10-CM

## 2016-03-23 LAB — CBC
HCT: 38 % (ref 36.0–46.0)
Hemoglobin: 12.7 g/dL (ref 12.0–15.0)
MCH: 30.1 pg (ref 26.0–34.0)
MCHC: 33.4 g/dL (ref 30.0–36.0)
MCV: 90 fL (ref 78.0–100.0)
PLATELETS: 194 10*3/uL (ref 150–400)
RBC: 4.22 MIL/uL (ref 3.87–5.11)
RDW: 12.8 % (ref 11.5–15.5)
WBC: 8.1 10*3/uL (ref 4.0–10.5)

## 2016-03-23 LAB — BASIC METABOLIC PANEL
ANION GAP: 9 (ref 5–15)
BUN: 15 mg/dL (ref 6–20)
CALCIUM: 9.2 mg/dL (ref 8.9–10.3)
CO2: 25 mmol/L (ref 22–32)
Chloride: 101 mmol/L (ref 101–111)
Creatinine, Ser: 0.53 mg/dL (ref 0.44–1.00)
Glucose, Bld: 124 mg/dL — ABNORMAL HIGH (ref 65–99)
POTASSIUM: 3.8 mmol/L (ref 3.5–5.1)
SODIUM: 135 mmol/L (ref 135–145)

## 2016-03-23 MED ORDER — ENOXAPARIN SODIUM 30 MG/0.3ML ~~LOC~~ SOLN
30.0000 mg | Freq: Once | SUBCUTANEOUS | Status: AC
  Administered 2016-03-23: 30 mg via SUBCUTANEOUS
  Filled 2016-03-23: qty 0.3

## 2016-03-23 MED ORDER — HYDRALAZINE HCL 20 MG/ML IJ SOLN
5.0000 mg | INTRAMUSCULAR | Status: DC | PRN
  Administered 2016-03-23 – 2016-03-27 (×2): 5 mg via INTRAVENOUS
  Filled 2016-03-23 (×2): qty 1

## 2016-03-23 MED ORDER — HYDRALAZINE HCL 20 MG/ML IJ SOLN
5.0000 mg | Freq: Once | INTRAMUSCULAR | Status: AC
  Administered 2016-03-23: 5 mg via INTRAVENOUS
  Filled 2016-03-23: qty 1

## 2016-03-23 NOTE — Evaluation (Signed)
Clinical/Bedside Swallow Evaluation Patient Details  Name: [name] C Pilley MRN: [id number] Date of Birth: 06/28/1924  Today's Date: 03/23/2016 Time:        Past Medical History:  Past Medical History  Diagnosis Date  . Hypertension   . Reflux   . Schizophrenia (HCC)   . Alzheimer disease   . Chronic back pain   . Clostridium difficile colitis   . Hiatal hernia   . Vertebral compression fracture [facility](HCC)    Past Surgical History:  Past Surgical History  Procedure Laterality Date  . Carotid stent    . Tumor of lung -benign , and removed.    . Carotid endarterectomy    . Lung removal, partial     HPI:  82 y.o. female with medical history significant of HTN, Alzheimer's dementia, osteoporosis, dysphagia; who presents after having a fall at home 2 days ago. History is obtained from the patient's daughter [name] due to the patient's history of dementia. Apparently 2 days ago, the patient who normally ambulates in a wheelchair scooted out of the chair falling on her hip. Patient was not noted to have suffered any head trauma or loss consciousness. Patient appeared to be fine and showed no signs of discomfort initially. However, today patient seemed more uncomfortable while sitting in her wheelchair in the program for further evaluation. Associated symptoms include history of difficulty swallowing for which the patient has been on a pured and thick liquid diet. They have been having difficulty keeping her hydrated and she's not been really wanting to eat much. They're in the process of trying to see if she is a candidate get a feeding tube gastroenterology. The daughter feels that the patient is a significant aspiration risk. Daughter also notes that she has significant history of osteoporosis and wonders if it is appropriate for her to have a surgical procedure for this injury   Assessment / Plan / Recommendation Clinical Impression     Pt exhibits mild-moderate oropharyngeal dysphagia  characterized by oral holding/prolonged oral consumption and suspected delayed swallow initiation; no overt s/s of aspiration/penetration noted, but daughter gave extensive information re: feeding at home where pt has nectar-thickened liquids and puree diet; family has considered non-oral feeding, but are unsure if pt is a candidate or not; may consider gastroenterologist consult; recommend Dysphagia 1 (Puree)/nectar-thickened liquids via small sips/bites; no straws; pt also requires tactile/verbal cues to initiate po intake with pressure to bottom lip at midline    Aspiration Risk    mild-moderate   Diet Recommendation   Dysphagia 1/nectar-thickened liquids       Other  Recommendations   GI consult  Follow up Recommendations    24 hr supervision   Frequency and Duration  2x/wk times 1 week          Prognosis   fair     Swallow Study   General Date of Onset: 03/22/16 HPI: 82 y.o. female with medical history significant of HTN, Alzheimer's dementia, osteoporosis, dysphagia; who presents after having a fall at home 2 days ago. History is obtained from the patient's daughter [name] due to the patient's history of dementia. Apparently 2 days ago, the patient who normally ambulates in a wheelchair scooted out of the chair falling on her hip. Patient was not noted to have suffered any head trauma or loss consciousness. Patient appeared to be fine and showed no signs of discomfort initially. However, today patient seemed more uncomfortable while sitting in her wheelchair in the program for further  evaluation. Associated symptoms include history of difficulty swallowing for which the patient has been on a pured and thick liquid diet. They have been having difficulty keeping her hydrated and she's not been really wanting to eat much. They're in the process of trying to see if she is a candidate get a feeding tube gastroenterology. The daughter feels that the patient is a significant  aspiration risk. Daughter also notes that she has significant history of osteoporosis and wonders if it is appropriate for her to have a surgical procedure for this injury Type of Study: Bedside Swallow Evaluation Diet Prior to this Study: NPO Temperature Spikes Noted: No Respiratory Status: Room air History of Recent Intubation: No Behavior/Cognition: Alert;Cooperative;Confused Oral Cavity Assessment: Within Functional Limits Oral Care Completed by SLP: Recent completion by staff Oral Cavity - Dentition: Edentulous Vision: Impaired for self-feeding Self-Feeding Abilities: Needs assist;Needs set up Patient Positioning: Upright in bed Baseline Vocal Quality: Low vocal intensity Volitional Cough: Cognitively unable to elicit Volitional Swallow: Unable to elicit    Oral/Motor/Sensory Function Overall Oral Motor/Sensory Function: Other (comment) (u/a to assess)   Ice Chips Ice chips: Not tested   Thin Liquid Thin Liquid: Not tested    Nectar Thick Nectar Thick Liquid: Impaired Presentation: Spoon Oral Phase Impairments: Other (comment) (perseverative oral movement similar to tongue pumping) Oral phase functional implications: Prolonged oral transit;Oral holding Pharyngeal Phase Impairments: Suspected delayed Swallow   Honey Thick Honey Thick Liquid: Not tested   Puree Puree: Impaired Presentation: Spoon   Solid        n/a       ADAMS,PAT, M.S., CCC-SLP 03/23/2016,12:03 PM

## 2016-03-23 NOTE — Progress Notes (Signed)
Called PA Porfirio MylarCarmen to inform her that the daughter insist of feeding her mom in the morning and talk to the surgeon prior to the sx. Her npo order will be change back to Dysphagia 1 but will hold breakfast until further confirmation.  Will remained NPO for now.

## 2016-03-23 NOTE — Progress Notes (Signed)
Family called RN very upset that she did not speak to any MD or surgeon today. Carmen/Dr.Johnson and Dr. Shon BatonBrooks spoke with pt's daughter today and plan was for surgery tomorrow. Pt's daughter stated that her mom "will not have surgery tomorrow" until the performing surgeon speak to her directly. She also want pt to have breakfast and staffs to physically place spoon in pt's mouth until pt eats because she believes that her mom is "hungry". RN attempted to explain to pt's daughter that pt will need to be NPO just incase plan for sx tomorrow. Daughter insists that her mom will eat breakfast in the morning. Diet placed per daughter requests. RN report this to oncoming RN.    Sim BoastHavy, RN

## 2016-03-23 NOTE — Progress Notes (Signed)
PROGRESS NOTE    [name]  WGN:[id number]  DOB: [birth year]  DOA: 03/22/2016 PCP: Fredirick Maudlin, MD Outpatient Specialists:   Hospital course: [name] is a 82 y.o. female with medical history significant of HTN, Alzheimer's dementia, osteoporosis, dysphagia; who presents after having a fall at home 2 days ago. History is obtained from the patient's daughter [name] due to the patient's history of dementia. Apparently 2 days ago, the patient who normally ambulates in a wheelchair scooted out of the chair falling on her hip. Patient was not noted to have suffered any head trauma or loss consciousness. Patient appeared to be fine and showed no signs of discomfort initially. However, today patient seemed more uncomfortable while sitting in her wheelchair in the program for further evaluation   Assessment & Plan:  Right subcapital hip fracture secondary to fall from wheelchair: Acute.Patient with fall from wheelchair to days ago. X-ray showing acute right hip fracture. Dr. Victorino Dike, Orthopedics question the possibility of taking patient to surgery on Sunday if medically cleared.  - Admitted to a MedSurg bed  - Hip fracture protocol initiated - IV morphine prn pain  - Likely to OR 7/9 with Dr. Victorino Dike  Dysphagia: Patient noted to be a high aspiration risk per daughter currently the third with a pured and thick liquid diet. Family and processed trying to see if patient can have a PEG tube placed.  - Check formal swallow eval with speech therapy   Dehydration: As seen by the elevated BUN to creatinine ratio 25: 0.53. - IV fluids normal saline at 60 ml/hour   Essential hypertension: Blood pressures seen as high as 189/95 on admission. Suspect some aspect of pain contributing to elevated blood pressures. - IV hydralazine  as needed for SBP>180  Hyperglycemia: Blood glucose noted to be 173 on admission - Continue to monitor and add on a sliding scale insulin if  needed  CBG (last 3)  No results for input(s): GLUCAP in the last 72 hours.  Dementia: Stable  DVT prophylaxis: lovenox given x1 Code Status: DNR Family Communication: none at bedside Disposition Plan: To be determined Consults called: Orthopedics Admission status: Inpatient MedSurg  Subjective: Pt is nonverbal (basically) but appears comfortable in no apparent distress.   Objective: Filed Vitals:   03/22/16 2324 03/23/16 0020 03/23/16 0500 03/23/16 0900  BP: 186/86 189/95 143/71 144/105  Pulse: 81 97 101 89  Temp:  98.7 F (37.1 C) 98.6 F (37 C)   TempSrc:  Oral    Resp: SpO2: 99% 97% 96% 97%   No intake or output data in the 24 hours ending 03/23/16 0927 There were no vitals filed for this visit.  Exam:  General exam: awake, in no apparent distress Respiratory system: Clear. No increased work of breathing. Cardiovascular system: S1 & S2 heard.  Gastrointestinal system: Abdomen is nondistended, soft and nontender. Normal bowel sounds heard. Central nervous system: Alert and oriented. No focal neurological deficits. Extremities: Right leg shortened and externally rotated ROM, no contractures..  Data Reviewed: Basic Metabolic Panel:  Recent Labs Lab 03/22/16 2221 03/23/16 0631  NA 132* 135  K 4.2 3.8  CL 99* 101  CO2 25 25  GLUCOSE 173* 124*  BUN 25* 15  CREATININE 0.53 0.53  CALCIUM 9.2 9.2   Liver Function Tests: No results for input(s): AST, ALT, ALKPHOS, BILITOT, PROT, ALBUMIN in the last 168 hours. No results for input(s): LIPASE, AMYLASE in the last 168 hours. No results  for input(s): AMMONIA in the last 168 hours. CBC:  Recent Labs Lab 03/22/16 2221 03/23/16 0631  WBC 8.9 8.1  NEUTROABS 7.5  --   HGB 12.9 12.7  HCT 38.6 38.0  MCV 90.2 90.0  PLT PLATELET CLUMPS NOTED ON SMEAR, COUNT APPEARS DECREASED 194   Cardiac Enzymes: No results for input(s): CKTOTAL, CKMB, CKMBINDEX, TROPONINI in the last 168 hours. BNP (last 3  results) No results for input(s): PROBNP in the last 8760 hours. CBG: No results for input(s): GLUCAP in the last 168 hours.  No results found for this or any previous visit (from the past 240 hour(s)).   Studies: Dg Chest 1 View  03/22/2016  CLINICAL DATA:  Fall, right leg pain EXAM: CHEST 1 VIEW COMPARISON:  06/09/2014 FINDINGS: Right basilar scarring/ atelectasis. No pleural effusion or pneumothorax. Cardiomegaly. Degenerative changes the visualized thoracolumbar spine. IMPRESSION: No evidence of acute cardiopulmonary disease. Electronically Signed   By: Charline BillsSriyesh  Krishnan M.D.   On: 03/22/2016 21:24   Dg Hip Unilat  With Pelvis 2-3 Views Right  03/22/2016  CLINICAL DATA:  Fall, right leg pain EXAM: DG HIP (WITH OR WITHOUT PELVIS) 2-3V RIGHT COMPARISON:  None. FINDINGS: Subcapital right hip fracture.  Foreshortening with varus deformity. Visualized bony pelvis appears intact. IMPRESSION: Subcapital right hip fracture, as above. Electronically Signed   By: Charline BillsSriyesh  Krishnan M.D.   On: 03/22/2016 21:23   Dg Femur, Min 2 Views Right  03/22/2016  CLINICAL DATA:  Fall, right leg pain EXAM: RIGHT FEMUR 2 VIEWS COMPARISON:  None. FINDINGS: Subcapital right hip fracture.  Foreshortening with varus deformity. Visualized bony pelvis appears intact. Distal femur appears intact. IMPRESSION: Subcapital right hip fracture, as above. Electronically Signed   By: Charline BillsSriyesh  Krishnan M.D.   On: 03/22/2016 21:23     Scheduled Meds: . ketoconazole  1 application Topical Daily  . multivitamin with minerals  1 tablet Oral Daily   Continuous Infusions: . sodium chloride 75 mL/hr at 03/22/16 2337    Principal Problem:   Hip fracture [facility](HCC) Active Problems:   Dementia   Hypertension   Dehydration   Hyperglycemia   Dysphagia   Fall from wheelchair  Standley Dakinslanford Johnson, MD, FAAFP [facility] Pager [phone]  If 7PM-7AM, please contact night-coverage [email or web address] Password [email or web address] 03/23/2016, 9:27 AM      LOS: 1 day

## 2016-03-23 NOTE — Progress Notes (Signed)
Patient ID: [name] C Sollenberger, female   DOB: 10/02/1923, 82 y.o.   MRN: [id number]    Subjective:   Procedure(s) (LRB): ARTHROPLASTY BIPOLAR HIP (HEMIARTHROPLASTY) POSTERIOR APPROACH (Right) Patient reports pain as 0 on 0-10 scale.  Pt does not respond to questions - unsure of pain level Denies CP or SOB.  Voiding without difficulty. Positive flatus. Objective: Vital signs in last 24 hours: Temp:  [98.6 F (37 C)-98.9 F (37.2 C)] 98.6 F (37 C) (07/08 0500) Pulse Rate:  [81-101] 89 (07/08 0900) Resp:  [15-22] 20 (07/08 0900) BP: (143-189)/(71-105) 144/105 mmHg (07/08 0900) SpO2:  [96 %-99 %] 97 % (07/08 0900)  Intake/Output from previous day:   Intake/Output this shift:    Labs:  Recent Labs  03/22/16 2221 03/23/16 0631  HGB 12.9 12.7    Recent Labs  03/22/16 2221 03/23/16 0631  WBC 8.9 8.1  RBC 4.28 4.22  HCT 38.6 38.0  PLT PLATELET CLUMPS NOTED ON SMEAR, COUNT APPEARS DECREASED 194    Recent Labs  03/22/16 2221 03/23/16 0631  NA 132* 135  K 4.2 3.8  CL 99* 101  CO2 25 25  BUN 25* 15  CREATININE 0.53 0.53  GLUCOSE 173* 124*  CALCIUM 9.2 9.2   No results for input(s): LABPT, INR in the last 72 hours.  Physical Exam: Severe dementia Does not respond to verbal commands Slight shortening of the   Assessment/Plan:   Procedure(s) (LRB): ARTHROPLASTY BIPOLAR HIP (HEMIARTHROPLASTY) POSTERIOR APPROACH (Right) Nurse asked me to call the pts daughter - tried both home and cell and did not get answer.  Left a message Considering surgical intervention tomorrow if medically cleared NPO after midnight  [name], Baxter Kailarmen Christina for Dr. Venita Lickahari Brooks [facility] [phone] 03/23/2016, 9:51 AM

## 2016-03-24 ENCOUNTER — Inpatient Hospital Stay (HOSPITAL_COMMUNITY): Payer: Medicare Other | Admitting: Anesthesiology

## 2016-03-24 ENCOUNTER — Encounter (HOSPITAL_COMMUNITY)
Admission: EM | Disposition: A | Discharge status: Home Health | Payer: Self-pay | Source: Home / Self Care | Attending: Family Medicine

## 2016-03-24 ENCOUNTER — Encounter (HOSPITAL_COMMUNITY): Payer: Self-pay | Admitting: Certified Registered Nurse Anesthetist

## 2016-03-24 HISTORY — PX: HIP ARTHROPLASTY: SHX981

## 2016-03-24 LAB — CBC
HEMATOCRIT: 34.9 % — AB (ref 36.0–46.0)
HEMOGLOBIN: 11.3 g/dL — AB (ref 12.0–15.0)
MCH: 29.7 pg (ref 26.0–34.0)
MCHC: 32.4 g/dL (ref 30.0–36.0)
MCV: 91.6 fL (ref 78.0–100.0)
Platelets: 215 10*3/uL (ref 150–400)
RBC: 3.81 MIL/uL — ABNORMAL LOW (ref 3.87–5.11)
RDW: 13.1 % (ref 11.5–15.5)
WBC: 7.9 10*3/uL (ref 4.0–10.5)

## 2016-03-24 LAB — CREATININE, SERUM: CREATININE: 0.45 mg/dL (ref 0.44–1.00)

## 2016-03-24 LAB — MRSA PCR SCREENING: MRSA by PCR: NEGATIVE

## 2016-03-24 SURGERY — HEMIARTHROPLASTY, HIP, DIRECT ANTERIOR APPROACH, FOR FRACTURE
Anesthesia: General | Site: Hip | Laterality: Right

## 2016-03-24 MED ORDER — CEFAZOLIN SODIUM 1 G IJ SOLR
INTRAMUSCULAR | Status: DC | PRN
  Administered 2016-03-24: 2 g via INTRAMUSCULAR

## 2016-03-24 MED ORDER — LIDOCAINE HCL (CARDIAC) 20 MG/ML IV SOLN
INTRAVENOUS | Status: DC | PRN
  Administered 2016-03-24: 50 mg via INTRAVENOUS

## 2016-03-24 MED ORDER — EPHEDRINE SULFATE 50 MG/ML IJ SOLN
INTRAMUSCULAR | Status: DC | PRN
  Administered 2016-03-24 (×2): 5 mg via INTRAVENOUS

## 2016-03-24 MED ORDER — FENTANYL CITRATE (PF) 250 MCG/5ML IJ SOLN
INTRAMUSCULAR | Status: AC
  Filled 2016-03-24: qty 5

## 2016-03-24 MED ORDER — ACETAMINOPHEN 325 MG PO TABS: 650.0000 mg | ORAL_TABLET | Freq: Four times a day (QID) | ORAL | Status: DC | PRN

## 2016-03-24 MED ORDER — ROCURONIUM BROMIDE 50 MG/5ML IV SOLN
INTRAVENOUS | Status: AC
  Filled 2016-03-24: qty 1

## 2016-03-24 MED ORDER — FERROUS SULFATE 325 (65 FE) MG PO TABS
325.0000 mg | ORAL_TABLET | Freq: Three times a day (TID) | ORAL | Status: DC
  Administered 2016-03-25 – 2016-03-27 (×6): 325 mg via ORAL
  Filled 2016-03-24 (×5): qty 1

## 2016-03-24 MED ORDER — CEFAZOLIN IN D5W 1 GM/50ML IV SOLN
1.0000 g | Freq: Four times a day (QID) | INTRAVENOUS | Status: AC
  Administered 2016-03-24 – 2016-03-25 (×3): 1 g via INTRAVENOUS
  Filled 2016-03-24 (×3): qty 50

## 2016-03-24 MED ORDER — METOCLOPRAMIDE HCL 5 MG PO TABS: 5.0000 mg | ORAL_TABLET | Freq: Three times a day (TID) | ORAL | Status: DC | PRN

## 2016-03-24 MED ORDER — TRAMADOL 5 MG/ML ORAL SUSPENSION: 12.5000 mg | ORAL | Status: DC | PRN

## 2016-03-24 MED ORDER — PROPOFOL 10 MG/ML IV BOLUS
INTRAVENOUS | Status: DC | PRN
  Administered 2016-03-24: 80 mg via INTRAVENOUS

## 2016-03-24 MED ORDER — PROPOFOL 10 MG/ML IV BOLUS
INTRAVENOUS | Status: AC
  Filled 2016-03-24: qty 20

## 2016-03-24 MED ORDER — METOCLOPRAMIDE HCL 5 MG/ML IJ SOLN: 5.0000 mg | Freq: Three times a day (TID) | INTRAMUSCULAR | Status: DC | PRN

## 2016-03-24 MED ORDER — PHENYLEPHRINE 40 MCG/ML (10ML) SYRINGE FOR IV PUSH (FOR BLOOD PRESSURE SUPPORT)
PREFILLED_SYRINGE | INTRAVENOUS | Status: AC
  Filled 2016-03-24: qty 10

## 2016-03-24 MED ORDER — ONDANSETRON HCL 4 MG/2ML IJ SOLN
INTRAMUSCULAR | Status: AC
  Filled 2016-03-24: qty 2

## 2016-03-24 MED ORDER — DOCUSATE SODIUM 100 MG PO CAPS
100.0000 mg | ORAL_CAPSULE | Freq: Two times a day (BID) | ORAL | Status: DC
  Administered 2016-03-25 – 2016-03-27 (×3): 100 mg via ORAL
  Filled 2016-03-24 (×5): qty 1

## 2016-03-24 MED ORDER — SUGAMMADEX SODIUM 200 MG/2ML IV SOLN
INTRAVENOUS | Status: DC | PRN
  Administered 2016-03-24: 100 mg via INTRAVENOUS

## 2016-03-24 MED ORDER — ONDANSETRON HCL 4 MG/2ML IJ SOLN: 4.0000 mg | Freq: Four times a day (QID) | INTRAMUSCULAR | Status: DC | PRN

## 2016-03-24 MED ORDER — SUGAMMADEX SODIUM 200 MG/2ML IV SOLN
INTRAVENOUS | Status: AC
  Filled 2016-03-24: qty 2

## 2016-03-24 MED ORDER — OXYCODONE HCL 5 MG/5ML PO SOLN: 5.0000 mg | Freq: Once | ORAL | Status: DC | PRN

## 2016-03-24 MED ORDER — DEXAMETHASONE SODIUM PHOSPHATE 10 MG/ML IJ SOLN
INTRAMUSCULAR | Status: AC
  Filled 2016-03-24: qty 1

## 2016-03-24 MED ORDER — FENTANYL CITRATE (PF) 100 MCG/2ML IJ SOLN
INTRAMUSCULAR | Status: DC | PRN
  Administered 2016-03-24 (×3): 50 ug via INTRAVENOUS
  Administered 2016-03-24: 100 ug via INTRAVENOUS

## 2016-03-24 MED ORDER — DEXAMETHASONE SODIUM PHOSPHATE 10 MG/ML IJ SOLN
INTRAMUSCULAR | Status: DC | PRN
  Administered 2016-03-24: 5 mg via INTRAVENOUS

## 2016-03-24 MED ORDER — ONDANSETRON HCL 4 MG/2ML IJ SOLN
INTRAMUSCULAR | Status: DC | PRN
  Administered 2016-03-24: 4 mg via INTRAVENOUS

## 2016-03-24 MED ORDER — ENOXAPARIN SODIUM 30 MG/0.3ML ~~LOC~~ SOLN
30.0000 mg | SUBCUTANEOUS | Status: DC
  Administered 2016-03-25 – 2016-03-27 (×3): 30 mg via SUBCUTANEOUS
  Filled 2016-03-24 (×3): qty 0.3

## 2016-03-24 MED ORDER — SODIUM CHLORIDE 0.9 % IR SOLN
Status: DC | PRN
  Administered 2016-03-24: 1000 mL

## 2016-03-24 MED ORDER — ROCURONIUM BROMIDE 100 MG/10ML IV SOLN
INTRAVENOUS | Status: DC | PRN
  Administered 2016-03-24: 25 mg via INTRAVENOUS

## 2016-03-24 MED ORDER — TRAMADOL HCL 50 MG PO TABS: 25.0000 mg | ORAL_TABLET | ORAL | Status: DC | PRN

## 2016-03-24 MED ORDER — METHOCARBAMOL 500 MG PO TABS: 500.0000 mg | ORAL_TABLET | Freq: Four times a day (QID) | ORAL | Status: DC | PRN

## 2016-03-24 MED ORDER — OXYCODONE HCL 5 MG PO TABS: 5.0000 mg | ORAL_TABLET | Freq: Once | ORAL | Status: DC | PRN

## 2016-03-24 MED ORDER — METHOCARBAMOL 1000 MG/10ML IJ SOLN: 500.0000 mg | Freq: Four times a day (QID) | INTRAVENOUS | Status: DC | PRN

## 2016-03-24 MED ORDER — FENTANYL CITRATE (PF) 100 MCG/2ML IJ SOLN: 25.0000 ug | INTRAMUSCULAR | Status: DC | PRN

## 2016-03-24 MED ORDER — ACETAMINOPHEN 650 MG RE SUPP: 650.0000 mg | Freq: Four times a day (QID) | RECTAL | Status: DC | PRN

## 2016-03-24 MED ORDER — LACTATED RINGERS IV SOLN
INTRAVENOUS | Status: DC | PRN
  Administered 2016-03-24 (×2): via INTRAVENOUS

## 2016-03-24 MED ORDER — POLYETHYLENE GLYCOL 3350 17 G PO PACK
17.0000 g | PACK | Freq: Every day | ORAL | Status: DC | PRN
  Administered 2016-03-26: 17 g via ORAL
  Filled 2016-03-24: qty 1

## 2016-03-24 SURGICAL SUPPLY — 50 items
BLADE SAW SGTL 18X1.27X75 (BLADE) ×2 IMPLANT
BLADE SAW SGTL 18X1.27X75MM (BLADE) ×1
CAPT HIP HEMI 1 ×3 IMPLANT
COVER SURGICAL LIGHT HANDLE (MISCELLANEOUS) ×3 IMPLANT
DERMABOND ADVANCED (GAUZE/BANDAGES/DRESSINGS) ×2
DERMABOND ADVANCED .7 DNX12 (GAUZE/BANDAGES/DRESSINGS) ×1 IMPLANT
DRAPE IMP U-DRAPE 54X76 (DRAPES) ×3 IMPLANT
DRAPE INCISE IOBAN 85X60 (DRAPES) ×3 IMPLANT
DRAPE ORTHO SPLIT 77X108 STRL (DRAPES) ×4
DRAPE SURG ORHT 6 SPLT 77X108 (DRAPES) ×2 IMPLANT
DRAPE U-SHAPE 47X51 STRL (DRAPES) ×3 IMPLANT
DRSG AQUACEL AG ADV 3.5X10 (GAUZE/BANDAGES/DRESSINGS) ×3 IMPLANT
DURAPREP 26ML APPLICATOR (WOUND CARE) ×3 IMPLANT
ELECT BLADE 4.0 EZ CLEAN MEGAD (MISCELLANEOUS) ×3
ELECT REM PT RETURN 9FT ADLT (ELECTROSURGICAL) ×3
ELECTRODE BLDE 4.0 EZ CLN MEGD (MISCELLANEOUS) ×1 IMPLANT
ELECTRODE REM PT RTRN 9FT ADLT (ELECTROSURGICAL) ×1 IMPLANT
EVACUATOR 1/8 PVC DRAIN (DRAIN) IMPLANT
FACESHIELD WRAPAROUND (MASK) ×6 IMPLANT
GLOVE BIOGEL PI IND STRL 7.5 (GLOVE) ×3 IMPLANT
GLOVE BIOGEL PI IND STRL 8 (GLOVE) ×3 IMPLANT
GLOVE BIOGEL PI INDICATOR 7.5 (GLOVE) ×6
GLOVE BIOGEL PI INDICATOR 8 (GLOVE) ×6
GLOVE ECLIPSE 8.0 STRL XLNG CF (GLOVE) ×3 IMPLANT
GLOVE ORTHO TXT STRL SZ7.5 (GLOVE) ×3 IMPLANT
GLOVE SURG ORTHO 8.0 STRL STRW (GLOVE) ×3 IMPLANT
GLOVE SURG SS PI 7.5 STRL IVOR (GLOVE) ×3 IMPLANT
GOWN STRL REIN 3XL XLG LVL4 (GOWN DISPOSABLE) ×3 IMPLANT
GOWN STRL REUS W/ TWL LRG LVL3 (GOWN DISPOSABLE) ×3 IMPLANT
GOWN STRL REUS W/TWL LRG LVL3 (GOWN DISPOSABLE) ×6
HANDPIECE INTERPULSE COAX TIP (DISPOSABLE)
IMMOBILIZER KNEE 22 UNIV (SOFTGOODS) IMPLANT
KIT BASIN OR (CUSTOM PROCEDURE TRAY) ×3 IMPLANT
KIT ROOM TURNOVER OR (KITS) ×3 IMPLANT
MANIFOLD NEPTUNE II (INSTRUMENTS) ×3 IMPLANT
NS IRRIG 1000ML POUR BTL (IV SOLUTION) ×3 IMPLANT
PACK TOTAL JOINT (CUSTOM PROCEDURE TRAY) ×3 IMPLANT
PACK UNIVERSAL I (CUSTOM PROCEDURE TRAY) ×3 IMPLANT
PAD ARMBOARD 7.5X6 YLW CONV (MISCELLANEOUS) ×6 IMPLANT
SET HNDPC FAN SPRY TIP SCT (DISPOSABLE) IMPLANT
SPONGE LAP 4X18 X RAY DECT (DISPOSABLE) ×6 IMPLANT
SUT MNCRL AB 4-0 PS2 18 (SUTURE) ×3 IMPLANT
SUT VIC AB 1 CT1 27 (SUTURE) ×8
SUT VIC AB 1 CT1 27XBRD ANBCTR (SUTURE) ×4 IMPLANT
SUT VIC AB 2-0 CT1 27 (SUTURE) ×4
SUT VIC AB 2-0 CT1 TAPERPNT 27 (SUTURE) ×2 IMPLANT
TOWEL OR 17X24 6PK STRL BLUE (TOWEL DISPOSABLE) ×3 IMPLANT
TOWEL OR 17X26 10 PK STRL BLUE (TOWEL DISPOSABLE) ×3 IMPLANT
TRAY FOLEY CATH 14FR (SET/KITS/TRAYS/PACK) IMPLANT
WATER STERILE IRR 1000ML POUR (IV SOLUTION) ×12 IMPLANT

## 2016-03-24 NOTE — Progress Notes (Signed)
PROGRESS NOTE    [name]  GMW:[id number]  DOB: July 16, 1924  DOA: 03/22/2016 PCP: Deborah Maudlin, MD Outpatient Specialists:   Hospital course: [name] is a 82 y.o. female with medical history significant of HTN, Alzheimer's dementia, osteoporosis, dysphagia; who presents after having a fall at home 2 days ago. History is obtained from the patient's daughter [name] due to the patient's history of dementia. Apparently 2 days ago, the patient who normally ambulates in a wheelchair scooted out of the chair falling on her hip. Patient was not noted to have suffered any head trauma or loss consciousness. Patient appeared to be fine and showed no signs of discomfort initially. However, today patient seemed more uncomfortable while sitting in her wheelchair in the program for further evaluation  Assessment & Plan: Right subcapital hip fracture secondary to fall from wheelchair: Acute.Patient with fall from wheelchair to days ago. X-ray showing acute right hip fracture. Orthopedics tentatively scheduled 7/9 for OR.  - Admitted to a MedSurg bed  - Hip fracture protocol initiated - IV morphine prn pain  - Likely to OR 7/9 with Dr. Charlann Boxer  Dysphagia: Patient noted to be a high aspiration risk per daughter currently the third with a pured and thick liquid diet. Family and processed trying to see if patient can have a PEG tube placed.  - swallow eval with speech therapy Pt to be on Dys 1 diet - see recommendations.    Dehydration: As seen by the elevated BUN to creatinine ratio 25: 0.53. - IV fluids normal saline at 60 ml/hour   Essential hypertension: Blood pressures seen as high as 189/95 on admission. Suspect some aspect of pain contributing to elevated blood pressures. - IV hydralazine  as needed for SBP>180.  Hyperglycemia: Blood glucose noted to be 173 on admission - Continue to monitor and add on a sliding scale insulin if needed  CBG (last 3)  No results for  input(s): GLUCAP in the last 72 hours.  Dementia: Stable  DVT prophylaxis: lovenox given x1 Code Status: DNR Family Communication: I had discussion with daughter (phone), she had lots of technical questions about surgery.  I asked her to speak with surgeon.   Disposition Plan: To be determined Consults called: Orthopedics Admission status: Inpatient MedSurg  Subjective: Pt is nonverbal but appears comfortable in no apparent distress.   Objective: Filed Vitals:   03/23/16 1413 03/23/16 2042 03/23/16 2351 03/24/16 0400  BP: 143/49 187/88 159/60 152/55  Pulse: 92 102  82  Temp: 98.4 F (36.9 C) 98.4 F (36.9 C)  98 F (36.7 C)  TempSrc: Oral Axillary  Axillary  Resp:  18  16  SpO2: 95% 98%  98%    Intake/Output Summary (Last 24 hours) at 03/24/16 0920 Last data filed at 03/24/16 0000  Gross per 24 hour  Intake 1617.75 ml  Output      0 ml  Net 1617.75 ml   There were no vitals filed for this visit.  Exam:  General exam: awake, in no apparent distress Respiratory system: Clear. No increased work of breathing. Cardiovascular system: S1 & S2 heard.  Gastrointestinal system: Abdomen is nondistended, soft and nontender. Normal bowel sounds heard. Central nervous system: Alert and oriented. No focal neurological deficits. Extremities: Right leg shortened and externally rotated ROM, no contractures..  Data Reviewed: Basic Metabolic Panel:  Recent Labs Lab 03/22/16 2221 03/23/16 0631  NA 132* 135  K 4.2 3.8  CL 99* 101  CO2 25 25  GLUCOSE  173* 124*  BUN 25* 15  CREATININE 0.53 0.53  CALCIUM 9.2 9.2   Liver Function Tests: No results for input(s): AST, ALT, ALKPHOS, BILITOT, PROT, ALBUMIN in the last 168 hours. No results for input(s): LIPASE, AMYLASE in the last 168 hours. No results for input(s): AMMONIA in the last 168 hours. CBC:  Recent Labs Lab 03/22/16 2221 03/23/16 0631 03/24/16 0557  WBC 8.9 8.1 7.9  NEUTROABS 7.5  --   --   HGB 12.9 12.7 11.3*    HCT 38.6 38.0 34.9*  MCV 90.2 90.0 91.6  PLT PLATELET CLUMPS NOTED ON SMEAR, COUNT APPEARS DECREASED 194 215   Cardiac Enzymes: No results for input(s): CKTOTAL, CKMB, CKMBINDEX, TROPONINI in the last 168 hours. BNP (last 3 results) No results for input(s): PROBNP in the last 8760 hours. CBG: No results for input(s): GLUCAP in the last 168 hours.  Recent Results (from the past 240 hour(s))  MRSA PCR Screening     Status: None   Collection Time: 03/24/16  4:24 AM  Result Value Ref Range Status   MRSA by PCR NEGATIVE NEGATIVE Final    Comment:        The GeneXpert MRSA Assay (FDA approved for NASAL specimens only), is one component of a comprehensive MRSA colonization surveillance program. It is not intended to diagnose MRSA infection nor to guide or monitor treatment for MRSA infections.      Studies: Dg Chest 1 View  03/22/2016  CLINICAL DATA:  Fall, right leg pain EXAM: CHEST 1 VIEW COMPARISON:  06/09/2014 FINDINGS: Right basilar scarring/ atelectasis. No pleural effusion or pneumothorax. Cardiomegaly. Degenerative changes the visualized thoracolumbar spine. IMPRESSION: No evidence of acute cardiopulmonary disease. Electronically Signed   By: Charline BillsSriyesh  Krishnan M.D.   On: 03/22/2016 21:24   Dg Hip Unilat  With Pelvis 2-3 Views Right  03/22/2016  CLINICAL DATA:  Fall, right leg pain EXAM: DG HIP (WITH OR WITHOUT PELVIS) 2-3V RIGHT COMPARISON:  None. FINDINGS: Subcapital right hip fracture.  Foreshortening with varus deformity. Visualized bony pelvis appears intact. IMPRESSION: Subcapital right hip fracture, as above. Electronically Signed   By: Charline BillsSriyesh  Krishnan M.D.   On: 03/22/2016 21:23   Dg Femur, Min 2 Views Right  03/22/2016  CLINICAL DATA:  Fall, right leg pain EXAM: RIGHT FEMUR 2 VIEWS COMPARISON:  None. FINDINGS: Subcapital right hip fracture.  Foreshortening with varus deformity. Visualized bony pelvis appears intact. Distal femur appears intact. IMPRESSION: Subcapital  right hip fracture, as above. Electronically Signed   By: Charline BillsSriyesh  Krishnan M.D.   On: 03/22/2016 21:23     Scheduled Meds: . ketoconazole  1 application Topical Daily  . multivitamin with minerals  1 tablet Oral Daily   Continuous Infusions: . sodium chloride 60 mL/hr at 03/23/16 [id number]    Principal Problem:   Hip fracture [facility](HCC) Active Problems:   Dementia   Hypertension   Dehydration   Hyperglycemia   Dysphagia   Fall from wheelchair  Standley Dakinslanford Novalee Horsfall, MD, FAAFP [facility] Pager [phone]  If 7PM-7AM, please contact night-coverage [email or web address] Password [email or web address] 03/24/2016, 9:20 AM    LOS: 2 days

## 2016-03-24 NOTE — Progress Notes (Signed)
Patient ID: [name], female   DOB: 02/26/1924, 82 y.o.   MRN: [id number]  82 yo lady with advanced alzheimers who lives with family with fall from wheelchair Displaced right femoral neck fracture  Had discussion with daughter on phone about situation and options Plan to do hemiarthroplasty today after trying to determine best timing  NPO Consent ordered to be completed by daughter either on phone or in person

## 2016-03-24 NOTE — Anesthesia Procedure Notes (Signed)
Procedure Name: Intubation Date/Time: 03/24/2016 12:10 PM Performed by: Little IshikawaMERCER, Tayvion Lauder L Pre-anesthesia Checklist: Patient identified, Emergency Drugs available, Suction available, Patient being monitored and Timeout performed Patient Re-evaluated:Patient Re-evaluated prior to inductionOxygen Delivery Method: Circle system utilized Preoxygenation: Pre-oxygenation with 100% oxygen Intubation Type: IV induction Ventilation: Mask ventilation without difficulty Laryngoscope Size: Mac and 4 Grade View: Grade I Tube type: Oral Tube size: 7.5 mm Number of attempts: 1 Airway Equipment and Method: Stylet Placement Confirmation: ETT inserted through vocal cords under direct vision,  positive ETCO2 and breath sounds checked- equal and bilateral Secured at: 21 cm Tube secured with: Tape Dental Injury: Teeth and Oropharynx as per pre-operative assessment

## 2016-03-24 NOTE — Op Note (Signed)
NAME:  [name], [name]                ACCOUNT NO.:  [id number]   MEDICAL RECORD NO.: [id number]   LOCATION:  1435                         FACILITY:  [facility]   DATE OF BIRTH:  [birth year]  PHYSICIAN:  Madlyn FrankelMatthew D. Charlann Boxerlin, M.D.     DATE OF PROCEDURE:  03/24/16                               OPERATIVE REPORT     PREOPERATIVE DIAGNOSIS:  Right displaced femoral neck fracture.   POSTOPERATIVE DIAGNOSIS:  Right displaced femoral neck fracture.   PROCEDURE:  Right hip hemiarthroplasty utilizing DePuy component, size 5  standard Summit [employer] Fit stem with a 41mm unipolar ball with a +0 adapter.   SURGEON:  Madlyn FrankelMatthew D. Charlann Boxerlin, MD   ASSISTANT:  Lanney GinsMatthew Babish, PA-C.   ANESTHESIA:  General.   SPECIMENS:  None.   DRAINS:  None.   BLOOD LOSS:  About 100 cc.   COMPLICATIONS:  None.   INDICATION OF PROCEDURE:  [name] is a 82 year old female with advanced dementia who lives with her daughter.  She unfortunately fell out of her wheel chair that she mobilizes in.  Due to pain she was brought to the ER.  She was admitted to the hospital after radiographs revealed a femoral neck fracture.  She was seen and evaluated and was scheduled for surgery for fixation.  The necessity of surgical repair was discussed with her daughter that she lives with and who has Licensed conveyancerHCPA.  Consent was obtained after reviewing risks of infection, DVT, component failure, and need for revision surgery.   PROCEDURE IN DETAIL:  The patient was brought to the operative theater. Once adequate anesthesia, preoperative antibiotics, 2 g of Ancef administered, the patient was positioned into the left lateral decubitus position with the right side up.  The right lower extremity was then prepped and draped in sterile fashion.  A time-out was performed identifying the patient, planned procedure, and extremity.   A lateral incision was made off the proximal trochanter. Sharp dissection was carried down to the iliotibial band and  gluteal fascia. The gluteal fascia was then incised for posterior approach.  The short external rotators were taken down separate from the posterior capsule. An L capsulotomy was made preserving the posterior leaflet for later anatomic repair. Fracture site was identified and after removing comminuted segments of the posterior femoral neck, the femoral head was removed without difficulty and measured on the back table  using the sizing rings and determined to be 41 mm in diameter.   The proximal femur was then exposed.  Retractors placed.  I then drilled, opened the proximal femur.  Then I hand reamed once and  Irrigated the canal to try to prevent fat emboli.  I began broaching the femur with a starting broach up to a size 5 broach (the size 6 would not seat by a cm) with good medial and lateral metaphyseal fit without evidence of any torsion or movement.  A trial reduction was carried out with a standard offset neck and a +0 adapter with a 41mm ball.  The hip reduced nicely.  The leg lengths appeared to be equal compared to the down leg.   The hip went through a range  of motion without evidence of any subluxation or impingement.   Given these findings, the trial components removed.  The final size 5 standard Summit [employer] Fit stem was opened.  After irrigating the canal, the final stem was impacted and sat at the level where the broach was. Based on this and the trial reduction, a +0 adapter was opened and impacted in the 41mm unipolar ball onto a clean and dry trunnion.  The hip had been irrigated throughout the case and again at this point.  I re- Approximated the posterior capsule to the superior leaflet using a  #1 Vicryl.  The remainder of the wound was closed with #1 Vicryl in the iliotibial band and gluteal fascia, a  2-0 Vicryl in the sub-Q tissue and a running 4-0 Monocryl in the skin.  The hip was cleaned, dried, and dressed sterilely using Dermabond and Aquacel  dressing.  She was then brought to recovery room, extubated in stable condition, tolerating the procedure well.  Lanney Gins, PA-C was present and utilized as Geophysicist/field seismologist for the entire case from  Preoperative positioning to management of the contralateral extremity and retractors to  General facilitation of the procedure.  He was also involved with primary wound closure.         Madlyn Frankel Charlann Boxer, M.D.

## 2016-03-24 NOTE — Transfer of Care (Signed)
Immediate Anesthesia Transfer of Care Note  Patient: [name]  Procedure(s) Performed: Procedure(s): ARTHROPLASTY BIPOLAR HIP (HEMIARTHROPLASTY) (Right)  Patient Location: PACU  Anesthesia Type:General  Level of Consciousness: awake  Airway & Oxygen Therapy: Patient Spontanous Breathing and Patient connected to face mask oxygen  Post-op Assessment: Report given to RN and Post -op Vital signs reviewed and stable  Post vital signs: Reviewed and stable  Last Vitals:  Filed Vitals:   03/24/16 0400 03/24/16 1053  BP: 152/55 139/42  Pulse: 82 89  Temp: 36.7 C 37.1 C  Resp: 16 15    Last Pain:  Filed Vitals:   03/24/16 1054  PainSc: Asleep      Patients Stated Pain Goal: Other (Comment) (Patient unable to determine) (03/23/16 0800)  Complications: No apparent anesthesia complications

## 2016-03-24 NOTE — Anesthesia Preprocedure Evaluation (Addendum)
Anesthesia Evaluation  Patient identified by MRN, date of birth, ID band Patient awake    Reviewed: Allergy & Precautions, NPO status , Patient's Chart, lab work & pertinent test results  Airway Mallampati: II   Neck ROM: full    Dental   Pulmonary neg pulmonary ROS,    breath sounds clear to auscultation       Cardiovascular hypertension, + Peripheral Vascular Disease   Rhythm:regular Rate:Normal     Neuro/Psych PSYCHIATRIC DISORDERS Schizophrenia Dementia.  Non-verbal. Neuromuscular disease    GI/Hepatic hiatal hernia,   Endo/Other    Renal/GU      Musculoskeletal   Abdominal   Peds  Hematology   Anesthesia Other Findings   Reproductive/Obstetrics                            Anesthesia Physical Anesthesia Plan  ASA: IV  Anesthesia Plan: General   Post-op Pain Management:    Induction: Intravenous  Airway Management Planned: Oral ETT  Additional Equipment:   Intra-op Plan:   Post-operative Plan: Extubation in OR  Informed Consent: I have reviewed the patients History and Physical, chart, labs and discussed the procedure including the risks, benefits and alternatives for the proposed anesthesia with the patient or authorized representative who has indicated his/her understanding and acceptance.     Plan Discussed with: CRNA, Anesthesiologist and Surgeon  Anesthesia Plan Comments: (Discussed the patient's care with her daughter who has POA.  She understands possible risks including but not limited to CVA, MI, respiratory failure, dilerium, and death.  She also wishes to temporarily suspend the DNR order in the perioperative areas.)       Anesthesia Quick Evaluation

## 2016-03-25 ENCOUNTER — Encounter (HOSPITAL_COMMUNITY): Payer: Self-pay | Admitting: Orthopedic Surgery

## 2016-03-25 ENCOUNTER — Encounter (HOSPITAL_COMMUNITY)
Admission: EM | Disposition: A | Discharge status: Home Health | Payer: Self-pay | Source: Home / Self Care | Attending: Family Medicine

## 2016-03-25 LAB — HEMOGLOBIN A1C
HEMOGLOBIN A1C: 5.4 % (ref 4.8–5.6)
Mean Plasma Glucose: 108 mg/dL

## 2016-03-25 LAB — CBC
HCT: 30.5 % — ABNORMAL LOW (ref 36.0–46.0)
Hemoglobin: 10.3 g/dL — ABNORMAL LOW (ref 12.0–15.0)
MCH: 30.3 pg (ref 26.0–34.0)
MCHC: 33.8 g/dL (ref 30.0–36.0)
MCV: 89.7 fL (ref 78.0–100.0)
PLATELETS: 187 10*3/uL (ref 150–400)
RBC: 3.4 MIL/uL — ABNORMAL LOW (ref 3.87–5.11)
RDW: 13 % (ref 11.5–15.5)
WBC: 7.8 10*3/uL (ref 4.0–10.5)

## 2016-03-25 LAB — COMPREHENSIVE METABOLIC PANEL
ALT: 13 U/L — ABNORMAL LOW (ref 14–54)
ANION GAP: 6 (ref 5–15)
AST: 19 U/L (ref 15–41)
Albumin: 2.5 g/dL — ABNORMAL LOW (ref 3.5–5.0)
Alkaline Phosphatase: 58 U/L (ref 38–126)
BILIRUBIN TOTAL: 0.7 mg/dL (ref 0.3–1.2)
BUN: 11 mg/dL (ref 6–20)
CHLORIDE: 106 mmol/L (ref 101–111)
CO2: 26 mmol/L (ref 22–32)
Calcium: 8.7 mg/dL — ABNORMAL LOW (ref 8.9–10.3)
Creatinine, Ser: 0.57 mg/dL (ref 0.44–1.00)
Glucose, Bld: 140 mg/dL — ABNORMAL HIGH (ref 65–99)
POTASSIUM: 3.7 mmol/L (ref 3.5–5.1)
Sodium: 138 mmol/L (ref 135–145)
TOTAL PROTEIN: 5.1 g/dL — AB (ref 6.5–8.1)

## 2016-03-25 SURGERY — HEMIARTHROPLASTY, HIP, DIRECT ANTERIOR APPROACH, FOR FRACTURE
Anesthesia: General | Laterality: Right

## 2016-03-25 MED ORDER — ENOXAPARIN SODIUM 30 MG/0.3ML ~~LOC~~ SOLN: 30.0000 mg | SUBCUTANEOUS | Status: DC

## 2016-03-25 MED ORDER — ACETAMINOPHEN 325 MG PO TABS: 650.0000 mg | ORAL_TABLET | Freq: Four times a day (QID) | ORAL | Status: DC | PRN

## 2016-03-25 NOTE — Progress Notes (Addendum)
PROGRESS NOTE    [name]  UJW:[id number]  DOB: 07/22/1924  DOA: 03/22/2016 PCP: Fredirick MaudlinHAWKINS,EDWARD L, MD Outpatient Specialists:  Hospital course: [name] is a 82 y.o. female with medical history significant of HTN, Alzheimer's dementia, osteoporosis, dysphagia; who presents after having a fall at home 2 days ago. History is obtained from the patient's daughter [name] due to the patient's history of dementia. Apparently 2 days ago, the patient who normally ambulates in a wheelchair scooted out of the chair falling on her hip. Patient was not noted to have suffered any head trauma or loss consciousness. Patient appeared to be fine and showed no signs of discomfort initially. However, today patient seemed more uncomfortable while sitting in her wheelchair in the program for further evaluation  Assessment & Plan: Right subcapital hip fracture secondary to fall from wheelchair: Acute.Patient with fall from wheelchair to days ago. X-ray showing acute right hip fracture. Orthopedics tentatively scheduled 7/9 for OR.  - Admitted to a MedSurg bed  - Hip fracture protocol initiated - IV morphine prn pain  - Taken to OR 7/9 with Dr. Charlann Boxerlin - postop care per ortho  Dysphagia: Patient noted to be a high aspiration risk per daughter currently the third with a pured and thick liquid diet. Family and processed trying to see if patient can have a PEG tube placed.  - swallow eval with speech therapy Pt to be on Dys 1 diet - see recommendations.    Early Dehydration: As seen by the elevated BUN to creatinine ratio 25: 0.53. - Treated with gentle IVF.   Acute Blood Loss Anemia - mild, will follow.   Essential hypertension: Blood pressures seen as high as 189/95 on admission. Suspect some aspect of pain contributing to elevated blood pressures. - IV hydralazine 5mg  as needed for SBP>180.  Hyperglycemia: Blood glucose noted to be 173 on admission, normal A1c, likely stress reaction.   - Continue to monitor and add on a sliding scale insulin if needed  CBG (last 3)  No results for input(s): GLUCAP in the last 72 hours.  Dementia: Stable  DVT prophylaxis: per ortho team Code Status: DNR Family Communication: I had discussion with daughter (phone), she had lots of technical questions about surgery.  I asked her to speak with surgeon.   Disposition Plan: To be determined Consults called: Orthopedics Admission status: Inpatient MedSurg  Subjective: Pt is nonverbal but appears in no apparent distress.   Objective: Filed Vitals:   03/24/16 1357 03/24/16 1408 03/24/16 2052 03/25/16 0528  BP: 168/70 168/52 151/60 138/70  Pulse:  71 68 79  Temp: 97.8 F (36.6 C) 97.5 F (36.4 C) 97.6 F (36.4 C) 97.6 F (36.4 C)  TempSrc:   Oral Oral  Resp:  14 16 16   SpO2:  99% 100% 100%    Intake/Output Summary (Last 24 hours) at 03/25/16 0737 Last data filed at 03/25/16 0602  Gross per 24 hour  Intake   2802 ml  Output   1070 ml  Net   1732 ml   There were no vitals filed for this visit.  Exam:  General exam: awake, in no apparent distress Respiratory system: Clear. No increased work of breathing. Cardiovascular system: S1 & S2 heard.  Gastrointestinal system: Abdomen is nondistended, soft and nontender. Normal bowel sounds heard. Central nervous system: Alert and oriented. No focal neurological deficits. Extremities: Right leg bandages clean,dry  Data Reviewed: Basic Metabolic Panel:  Recent Labs Lab 03/22/16 2221 03/23/16 0631 03/24/16 [id number]  03/25/16 0345  NA 132* 135  --  138  K 4.2 3.8  --  3.7  CL 99* 101  --  106  CO2 25 25  --  26  GLUCOSE 173* 124*  --  140*  BUN 25* 15  --  11  CREATININE 0.53 0.53 0.45 0.57  CALCIUM 9.2 9.2  --  8.7*   Liver Function Tests:  Recent Labs Lab 03/25/16 0345  AST 19  ALT 13*  ALKPHOS 58  BILITOT 0.7  PROT 5.1*  ALBUMIN 2.5*   No results for input(s): LIPASE, AMYLASE in the last 168 hours. No results  for input(s): AMMONIA in the last 168 hours. CBC:  Recent Labs Lab 03/22/16 2221 03/23/16 0631 03/24/16 0557 03/25/16 0345  WBC 8.9 8.1 7.9 7.8  NEUTROABS 7.5  --   --   --   HGB 12.9 12.7 11.3* 10.3*  HCT 38.6 38.0 34.9* 30.5*  MCV 90.2 90.0 91.6 89.7  PLT PLATELET CLUMPS NOTED ON SMEAR, COUNT APPEARS DECREASED 194 215 187   Cardiac Enzymes: No results for input(s): CKTOTAL, CKMB, CKMBINDEX, TROPONINI in the last 168 hours. BNP (last 3 results) No results for input(s): PROBNP in the last 8760 hours. CBG: No results for input(s): GLUCAP in the last 168 hours.  Recent Results (from the past 240 hour(s))  MRSA PCR Screening     Status: None   Collection Time: 03/24/16  4:24 AM  Result Value Ref Range Status   MRSA by PCR NEGATIVE NEGATIVE Final    Comment:        The GeneXpert MRSA Assay (FDA approved for NASAL specimens only), is one component of a comprehensive MRSA colonization surveillance program. It is not intended to diagnose MRSA infection nor to guide or monitor treatment for MRSA infections.     Studies: No results found.  Scheduled Meds: . docusate sodium  100 mg Oral BID  . enoxaparin (LOVENOX) injection  30 mg Subcutaneous Q24H  . ferrous sulfate  325 mg Oral TID PC  . ketoconazole  1 application Topical Daily  . multivitamin with minerals  1 tablet Oral Daily   Continuous Infusions: . sodium chloride 60 mL/hr at 03/25/16 0001   Principal Problem:   Hip fracture [facility]) Active Problems:   Dementia   Hypertension   Dehydration   Hyperglycemia   Dysphagia   Fall from wheelchair  Standley Dakins, MD, FAAFP [facility] Pager [phone]  If 7PM-7AM, please contact night-coverage [email or web address] Password [email or web address] 03/25/2016, 7:37 AM    LOS: 3 days

## 2016-03-25 NOTE — Evaluation (Signed)
Occupational Therapy Evaluation and Discharge Patient Details Name: [name] MRN: [id number] DOB: Aug 28, 1924 Today's Date: 03/25/2016    History of Present Illness Pt admitted with R hip fx after slipping out of her chair, underwent R hemiarthroplasty. PMH: advanced Alzheimers, HTN, osteoporosis, dysphagia.   Clinical Impression   Pt requires total assist for mobility and ADL at baseline. Educated daughter in hip precautions and transferred pt from bed to chair with +2 total assist. No acute OT needs, PT to instruct family in transfers. Will sign off.    Follow Up Recommendations  No OT follow up;Supervision/Assistance - 24 hour    Equipment Recommendations  None recommended by OT    Recommendations for Other Services       Precautions / Restrictions Precautions Precautions: Fall;Posterior Hip Precaution Booklet Issued: Yes (comment) Precaution Comments: educated daughter in precautions/positioning Restrictions Weight Bearing Restrictions: Yes RLE Weight Bearing: Weight bearing as tolerated      Mobility Bed Mobility Overal bed mobility: Needs Assistance;+2 for physical assistance Bed Mobility: Supine to Sit     Supine to sit: +2 for physical assistance;Total assist     General bed mobility comments: used pad to pivot pt to EOB and position hips at EOB  Transfers Overall transfer level: Needs assistance Equipment used: 2 person hand held assist Transfers: Sit to/from [employer] Sit to Stand: +2 physical assistance;Total assist   Squat pivot transfers: +2 physical assistance;Total assist     General transfer comment: used pad and gait belt to pivot pt toward L side to chair    Balance Overall balance assessment: Needs assistance Sitting-balance support: Feet supported Sitting balance-Leahy Scale: Poor Sitting balance - Comments: posterior lean initially     Standing balance-Leahy Scale: Zero                               ADL Overall ADL's : At baseline                                       General ADL Comments: total assist     Vision     Perception     Praxis      Pertinent Vitals/Pain Pain Assessment: Faces Faces Pain Scale: No hurt Pain Intervention(s): Monitored during session     Hand Dominance     Extremity/Trunk Assessment Upper Extremity Assessment Upper Extremity Assessment: Generalized weakness (maintains closed hands R>L, easily ranged)   Lower Extremity Assessment Lower Extremity Assessment: Defer to PT evaluation   Cervical / Trunk Assessment Cervical / Trunk Assessment: Kyphotic   Communication Communication Communication: Expressive difficulties (minimally intelligible)   Cognition Arousal/Alertness: Awake/alert Behavior During Therapy: WFL for tasks assessed/performed Overall Cognitive Status: History of cognitive impairments - at baseline                     General Comments       Exercises       Shoulder Instructions      Home Living Family/patient expects to be discharged to:: Private residence Living Arrangements: Children Available Help at Discharge: Family;Available 24 hours/day Type of Home: House Home Access: Stairs to enter [employer] of Steps: 2   Home Layout: One level               Home Equipment: Wheelchair - manual (padded/reclining commode, hoyer lift, lift chair)  Prior Functioning/Environment Level of Independence: Needs assistance  Gait / Transfers Assistance Needed: max assist to walk with gait belt, 1-2 person assist for transfers ADL's / Homemaking Assistance Needed: total care        OT Diagnosis: Generalized weakness;Cognitive deficits   OT Problem List:     OT Treatment/Interventions:      OT Goals(Current goals can be found in the care plan section) Acute Rehab OT Goals Patient Stated Goal: daughter want to take pt home  OT Frequency:     Barriers to D/C:             Co-evaluation PT/OT/SLP Co-Evaluation/Treatment: Yes Reason for Co-Treatment: For patient/therapist safety   OT goals addressed during session: Strengthening/ROM      End of Session Equipment Utilized During Treatment: Gait belt Nurse Communication: Mobility status (may leave 02 off)  Activity Tolerance: Patient tolerated treatment well Patient left: in chair;with call bell/phone within reach;with chair alarm set;with family/visitor present   Time: [id number] OT Time Calculation (min): 46 min Charges:  OT General Charges $OT Visit: 1 Procedure OT Evaluation $OT Eval Moderate Complexity: 1 Procedure OT Treatments $Therapeutic Activity: 8-22 mins G-Codes:    [name], [name] 03/25/2016, 10:58 AM  [phone]

## 2016-03-25 NOTE — Progress Notes (Signed)
   03/25/16 1400  Clinical Encounter Type  Visited With Patient and family together  Visit Type Initial  Referral From Family  Spiritual Encounters  Spiritual Needs Prayer;Emotional  On afternoon rounds Chaplain encountered a patient's family member who requested that her mom be put back to bed.  Family member (daughter)  shared her concern of not wanting to anger the nurses.  The patient's daughter said  PT had states the patient not spend more than 2 hours sitting.  Daughter stated patient had been sitting up for 4 hours.  Chaplain made charge nurse aware of family member's concerns. Chaplain share with daughter someone would be alone as soon as possible.  Chaplain prayed for patient and made further support available if needed.

## 2016-03-25 NOTE — Clinical Documentation Improvement (Signed)
Internal Medicine  Can the diagnosis of "Postop Anemia" be further specified? Please document findings in next progress note. Thank you!   Acute Blood Loss Anemia  Chronic Blood Loss Anemia  Precipitous drop in Hct  Other  Clinically Undetermined  Document any associated diagnoses/conditions.  Supporting Information:  H&H dropped from 12.7/38 on admission to 10/30.5 postop  Serial daily monitoring  Please exercise your independent, professional judgment when responding. A specific answer is not anticipated or expected.  Thank You,  Ruthine DoseEileen Kammie Scioli RN, BSN, CCDS Clinical Documentation Specialist [facility] [phone]; cell [phone]

## 2016-03-25 NOTE — Progress Notes (Signed)
Speech Language Pathology Treatment: Dysphagia  Patient Details Name: [name] MRN: [id number] DOB: 04/14/1924 Today's Date: 03/25/2016 Time: 0907-0920 SLP Time Calculation (min) (ACUTE ONLY): 13 min  Assessment / Plan / Recommendation Clinical Impression  Pt has surgery yesterday. Attempted to feed pt am meal with strategies suggested by family. Pt awakened easily and did not exhibit any signs of pain. With max tactile cues pt would not willingly accept any POs to mouth, sealing lips tight, turning head away, at times saying "I dont want it." SLP was able to introduce minimal amounts of nectar thick and pudding boluses to trigger gustatory response and oral pumping for transit and eventual swallow response. No signs of aspiration seen, but intake not functional as pt was actively resisting and SLP would need to force feed pt to continue, which increases aspiration risk. After several attempts, meal set to the side. Discussed result with RN who plans to hopefully mobilize pt later and see if her response to PO improves. Suspect pts mentation even further impaired following recent surgery, will follow for progress.    HPI HPI: 82 y.o. female with medical history significant of HTN, Alzheimer's dementia, osteoporosis, dysphagia; who presents after having a fall at home 2 days ago. History is obtained from the patient's daughter [name] due to the patient's history of dementia. Apparently 2 days ago, the patient who normally ambulates in a wheelchair scooted out of the chair falling on her hip. Patient was not noted to have suffered any head trauma or loss consciousness. Patient appeared to be fine and showed no signs of discomfort initially. However, today patient seemed more uncomfortable while sitting in her wheelchair in the program for further evaluation. Associated symptoms include history of difficulty swallowing for which the patient has been on a pured and thick liquid diet. They have  been having difficulty keeping her hydrated and she's not been really wanting to eat much. They're in the process of trying to see if she is a candidate get a feeding tube gastroenterology. The daughter feels that the patient is a significant aspiration risk. Daughter also notes that she has significant history of osteoporosis and wonders if it is appropriate for her to have a surgical procedure for this injury      SLP Plan  Continue with current plan of care     Recommendations  Diet recommendations: Dysphagia 1 (puree);Nectar-thick liquid Liquids provided via: Teaspoon Medication Administration: Crushed with puree Supervision: Staff to assist with self feeding;Full supervision/cueing for compensatory strategies;Trained caregiver to feed patient Compensations: Slow rate;Small sips/bites;Minimize environmental distractions Postural Changes and/or Swallow Maneuvers: Seated upright 90 degrees;Upright 30-60 min after meal             Oral Care Recommendations: Oral care BID Follow up Recommendations: 24 hour supervision/assistance Plan: Continue with current plan of care     GO               [address] Surgery Center LLCBonnie Thos Matsumoto, MA CCC-SLP [phone]  [name], [name] 03/25/2016, 9:24 AM

## 2016-03-25 NOTE — Care Management Important Message (Signed)
Important Message  Patient Details  Name: [name] C Normoyle MRN: [id number] Date of Birth: 11/22/1923   Medicare Important Message Given:  Yes    Bernadette HoitShoffner, Irisha Grandmaison Coleman 03/25/2016, 9:43 AM

## 2016-03-25 NOTE — Evaluation (Signed)
Physical Therapy Evaluation Patient Details Name: [name] C Mcelmurry MRN: [id number] DOB: 12/17/1923 Today's Date: 03/25/2016   History of Present Illness  Pt admitted with R hip fx after slipping out of her chair, underwent R hemiarthroplasty. PMH: advanced Alzheimers, HTN, osteoporosis, dysphagia.  Clinical Impression  Pt's daughter gave all information regarding pt's hx and PLOF secondary to pt's cognitive deficits. Pt tolerated assisted bed mobility and transfers well, and did not demonstrate any signs of pain. Pt's daughter stated that she would like to return home with pt and continue to care for her there. She also stated that she has all of the necessary equipment she needs at home. Pt would continue to benefit from skilled physical therapy services at this time to address her limitations listed below while inpatient and after d/c.      Follow Up Recommendations Home health PT    Equipment Recommendations  Other (comment) (pt's daughter reported that pt has all necessary DME at home)    Recommendations for Other Services       Precautions / Restrictions Precautions Precautions: Fall;Posterior Hip Precaution Booklet Issued: Yes (comment) Precaution Comments: educated daughter in precautions/positioning Restrictions Weight Bearing Restrictions: Yes RLE Weight Bearing: Weight bearing as tolerated      Mobility  Bed Mobility Overal bed mobility: Needs Assistance;+2 for physical assistance Bed Mobility: Supine to Sit     Supine to sit: +2 for physical assistance;Total assist     General bed mobility comments: used pad to pivot pt to EOB and position hips at EOB  Transfers Overall transfer level: Needs assistance Equipment used: 2 person hand held assist Transfers: Sit to/from [employer]Stand;Squat Pivot Transfers Sit to Stand: +2 physical assistance;Total assist   Squat pivot transfers: +2 physical assistance;Total assist     General transfer comment: used pad and gait belt to  pivot pt toward L side to chair  Ambulation/Gait                Stairs            Wheelchair Mobility    Modified Rankin (Stroke Patients Only)       Balance Overall balance assessment: Needs assistance Sitting-balance support: Feet supported Sitting balance-Leahy Scale: Poor Sitting balance - Comments: posterior lean initially   Standing balance support: Bilateral upper extremity supported Standing balance-Leahy Scale: Zero                               Pertinent Vitals/Pain Pain Assessment: Faces Faces Pain Scale: No hurt Pain Intervention(s): Monitored during session    Home Living Family/patient expects to be discharged to:: Private residence Living Arrangements: Children Available Help at Discharge: Family;Available 24 hours/day Type of Home: House Home Access: Stairs to enter   [employer] CorporationEntrance Stairs-Number of Steps: 2 Home Layout: One level Home Equipment: Wheelchair - manual (padded/reclining commode, hoyer lift, lift chair)      Prior Function Level of Independence: Needs assistance   Gait / Transfers Assistance Needed: max assist to walk with gait belt, 1-2 person assist for transfers  ADL's / Homemaking Assistance Needed: total care        Hand Dominance        Extremity/Trunk Assessment   Upper Extremity Assessment: Defer to OT evaluation           Lower Extremity Assessment: Generalized weakness;RLE deficits/detail RLE Deficits / Details: Pt with strength and ROM deficits secondary to post-op.    Cervical / Trunk  Assessment: Kyphotic  Communication   Communication: Expressive difficulties  Cognition Arousal/Alertness: Awake/alert Behavior During Therapy: WFL for tasks assessed/performed Overall Cognitive Status: History of cognitive impairments - at baseline                      General Comments      Exercises        Assessment/Plan    PT Assessment Patient needs continued PT services  PT  Diagnosis Difficulty walking   PT Problem List Decreased strength;Decreased activity tolerance;Decreased range of motion;Decreased balance;Decreased mobility;Decreased safety awareness  PT Treatment Interventions Functional mobility training;Patient/family education;DME instruction;Therapeutic activities;Balance training   PT Goals (Current goals can be found in the Care Plan section) Acute Rehab PT Goals Patient Stated Goal: Daughter would like to take pt home PT Goal Formulation: With family Time For Goal Achievement: 04/01/16 Potential to Achieve Goals: Fair    Frequency Min 5X/week   Barriers to discharge        Co-evaluation PT/OT/SLP Co-Evaluation/Treatment: Yes Reason for Co-Treatment: Necessary to address cognition/behavior during functional activity;For patient/therapist safety PT goals addressed during session: Mobility/safety with mobility;Balance OT goals addressed during session: Strengthening/ROM       End of Session Equipment Utilized During Treatment: Gait belt Activity Tolerance: Patient tolerated treatment well (did not demonstrate any signs of pain throughout session) Patient left: in chair;with call bell/phone within reach;with chair alarm set;with family/visitor present Nurse Communication: Mobility status         Time: 1610-9604 PT Time Calculation (min) (ACUTE ONLY): 37 min   Charges:   PT Evaluation $PT Eval Moderate Complexity: 1 Procedure     PT G CodesAlessandra Bevels Ishitha Roper 03/25/2016, 12:07 PM  Deborah Chalk, PT, DPT [phone]

## 2016-03-25 NOTE — Progress Notes (Addendum)
     Subjective: 1 Day Post-Op Procedure(s) (LRB): ARTHROPLASTY BIPOLAR HIP (HEMIARTHROPLASTY) (Right)   Patient expressing no significant pain.  Resting comfortable in bed.  No events reported throughout the night. Questions were encouraged and answered may of the daughters questions.  Objective:   VITALS:   Filed Vitals:   03/24/16 2052 03/25/16 0528  BP: 151/60 138/70  Pulse: 68 79  Temp: 97.6 F (36.4 C) 97.6 F (36.4 C)  Resp: 16 16    Dorsiflexion/Plantar flexion intact Incision: dressing C/D/I No cellulitis present Compartment soft  LABS  Recent Labs  03/23/16 0631 03/24/16 0557 03/25/16 0345  HGB 12.7 11.3* 10.3*  HCT 38.0 34.9* 30.5*  WBC 8.1 7.9 7.8  PLT 194 215 187     Recent Labs  03/22/16 2221 03/23/16 0631 03/24/16 0557 03/25/16 0345  NA 132* 135  --  138  K 4.2 3.8  --  3.7  BUN 25* 15  --  11  CREATININE 0.53 0.53 0.45 0.57  GLUCOSE 173* 124*  --  140*     Assessment/Plan: 1 Day Post-Op Procedure(s) (LRB): ARTHROPLASTY BIPOLAR HIP (HEMIARTHROPLASTY) (Right)  Up with therapy  Discharge/ disposition to be determined through medicine  Ortho recommendations:  Lovenox for anticoagulation (Rx written), unless other medically indicated.  Tramadol or APAP for pain management.  MiraLax and Colace for constipation  Iron 325 mg tid for 2-3 weeks   WBAT on the right leg.  Dressing to remain in place until follow in clinic in 2 weeks.  Dressing is waterproof and may shower with it in place.  [facility] Follow up with OLIN,Rayaan Lorah D in 2 weeks.  Contact information:  [facility] [address] [place] [id number] [phone]      Anastasio AuerbachMatthew S. Carolyn Sylvia   PAC  03/25/2016, 7:21 AM

## 2016-03-26 LAB — CBC
HEMATOCRIT: 28.8 % — AB (ref 36.0–46.0)
HEMOGLOBIN: 9.4 g/dL — AB (ref 12.0–15.0)
MCH: 29.8 pg (ref 26.0–34.0)
MCHC: 32.6 g/dL (ref 30.0–36.0)
MCV: 91.4 fL (ref 78.0–100.0)
Platelets: 204 10*3/uL (ref 150–400)
RBC: 3.15 MIL/uL — AB (ref 3.87–5.11)
RDW: 13.3 % (ref 11.5–15.5)
WBC: 7.9 10*3/uL (ref 4.0–10.5)

## 2016-03-26 LAB — BASIC METABOLIC PANEL
ANION GAP: 5 (ref 5–15)
BUN: 21 mg/dL — ABNORMAL HIGH (ref 6–20)
CHLORIDE: 106 mmol/L (ref 101–111)
CO2: 25 mmol/L (ref 22–32)
Calcium: 8.3 mg/dL — ABNORMAL LOW (ref 8.9–10.3)
Creatinine, Ser: 0.6 mg/dL (ref 0.44–1.00)
Glucose, Bld: 105 mg/dL — ABNORMAL HIGH (ref 65–99)
POTASSIUM: 3.8 mmol/L (ref 3.5–5.1)
SODIUM: 136 mmol/L (ref 135–145)

## 2016-03-26 MED ORDER — RESOURCE THICKENUP CLEAR PO POWD
ORAL | Status: DC | PRN
  Filled 2016-03-26: qty 125

## 2016-03-26 MED ORDER — STARCH (THICKENING) PO POWD
ORAL | Status: DC | PRN
  Filled 2016-03-26: qty 227

## 2016-03-26 MED ORDER — SODIUM CHLORIDE 0.9 % IV SOLN
INTRAVENOUS | Status: DC
  Administered 2016-03-26: 14:00:00 via INTRAVENOUS

## 2016-03-26 NOTE — Progress Notes (Signed)
Speech Language Pathology Treatment: Dysphagia  Patient Details Name: [name] MRN: [id number] DOB: 01/30/1924 Today's Date: 03/26/2016 Time: [id number] SLP Time Calculation (min) (ACUTE ONLY): 32 min  Assessment / Plan / Recommendation Clinical Impression  Pt demonstrated decreased resistance to Po intake today, suspect due to decreased pain or agitation. SLP was able to feed pt 2 oz of juice and 2 oz of pudding over 30 minutes. Feeding pt is very laborious and pt behaviors are likely to lead staff to think pt is not willing or capable of PO intake. Pt keeps lips tightly sealed and turns head away when mouth is touching. With force applied to the lower mandible puree or nectar thick bolus can be introduced with prolonged pumping and eventual swallow response. Pt is at high risk of aspiration with these methods, but no other alternative available. SLP was able to demonstrate methods to NT who will be feeding the pt her next two meals. Further SLP intervention not needed as pt is at baseline function and education complete at this point. Daughter is best capable of feeding and caring for pt at home; outside caregivers will struggle with feeding this pt.    HPI HPI: 82 y.o. female with medical history significant of HTN, Alzheimer's dementia, osteoporosis, dysphagia; who presents after having a fall at home 2 days ago. History is obtained from the patient's daughter [name] due to the patient's history of dementia. Apparently 2 days ago, the patient who normally ambulates in a wheelchair scooted out of the chair falling on her hip. Patient was not noted to have suffered any head trauma or loss consciousness. Patient appeared to be fine and showed no signs of discomfort initially. However, today patient seemed more uncomfortable while sitting in her wheelchair in the program for further evaluation. Associated symptoms include history of difficulty swallowing for which the patient has been on a  pured and thick liquid diet. They have been having difficulty keeping her hydrated and she's not been really wanting to eat much. They're in the process of trying to see if she is a candidate get a feeding tube gastroenterology. The daughter feels that the patient is a significant aspiration risk. Daughter also notes that she has significant history of osteoporosis and wonders if it is appropriate for her to have a surgical procedure for this injury      SLP Plan  Continue with current plan of care     Recommendations  Diet recommendations: Dysphagia 1 (puree);Nectar-thick liquid Liquids provided via: Cup;Teaspoon Medication Administration: Crushed with puree Supervision: Staff to assist with self feeding;Full supervision/cueing for compensatory strategies;Trained caregiver to feed patient Compensations: Slow rate;Small sips/bites;Minimize environmental distractions Postural Changes and/or Swallow Maneuvers: Seated upright 90 degrees;Upright 30-60 min after meal             Oral Care Recommendations: Oral care BID Follow up Recommendations: 24 hour supervision/assistance Plan: Continue with current plan of care     GO               [facility] ParhamBonnie Tama Grosz, MA CCC-SLP [phone]  Claudine MoutonDeBlois, Baillie Mohammad Caroline 03/26/2016, 9:37 AM

## 2016-03-26 NOTE — Progress Notes (Signed)
Physical Therapy Treatment Patient Details Name: [name] MRN: [id number] DOB: 02/27/1924 Today's Date: 03/26/2016    History of Present Illness Pt admitted with R hip fx after slipping out of her chair, underwent R hemiarthroplasty. PMH: advanced Alzheimers, HTN, osteoporosis, dysphagia.    PT Comments    Pt presented supine in bed with HOB elevated and LEs elevated. Pt continues to require two person total assist for bed mobility and transfers. Pt would continue to benefit from skilled physical therapy services at this time while inpatient and after d/c.    Follow Up Recommendations  Home health PT     Equipment Recommendations  Other (comment)    Recommendations for Other Services       Precautions / Restrictions Precautions Precautions: Fall;Posterior Hip Precaution Booklet Issued: Yes (comment) Precaution Comments: educated daughter in precautions/positioning during evaluation session Restrictions Weight Bearing Restrictions: Yes RLE Weight Bearing: Weight bearing as tolerated    Mobility  Bed Mobility Overal bed mobility: Needs Assistance;+2 for physical assistance Bed Mobility: Supine to Sit     Supine to sit: +2 for physical assistance;Total assist     General bed mobility comments: used pad to pivot pt to EOB and position hips at EOB  Transfers Overall transfer level: Needs assistance Equipment used: 2 person hand held assist Transfers: Stand Pivot Transfers   Stand pivot transfers: +2 physical assistance;Total assist       General transfer comment: Transferred pt towards her L side again today with use of gait belt  Ambulation/Gait                 Stairs            Wheelchair Mobility    Modified Rankin (Stroke Patients Only)       Balance Overall balance assessment: Needs assistance Sitting-balance support: Bilateral upper extremity supported;Feet supported Sitting balance-Leahy Scale: Zero   Postural control: Posterior  lean   Standing balance-Leahy Scale: Zero                      Cognition Arousal/Alertness: Awake/alert Behavior During Therapy: WFL for tasks assessed/performed Overall Cognitive Status: History of cognitive impairments - at baseline                      Exercises      General Comments        Pertinent Vitals/Pain Pain Assessment: Faces Faces Pain Scale: No hurt Pain Intervention(s): Monitored during session    Home Living                      Prior Function            PT Goals (current goals can now be found in the care plan section) Acute Rehab PT Goals Patient Stated Goal: Daughter would like to take pt home PT Goal Formulation: With family Time For Goal Achievement: 04/01/16 Potential to Achieve Goals: Fair Progress towards PT goals: Progressing toward goals    Frequency  Min 5X/week    PT Plan Current plan remains appropriate    Co-evaluation             End of Session Equipment Utilized During Treatment: Gait belt Activity Tolerance: Patient tolerated treatment well Patient left: in chair;with call bell/phone within reach;with chair alarm set     Time: 1005-1023 PT Time Calculation (min) (ACUTE ONLY): 18 min  Charges:  $Therapeutic Activity: 8-22 mins  G CodesAlessandra Bevels Blake Goya April 18, 2016, 12:04 PM  Deborah Chalk, PT, DPT [phone]

## 2016-03-26 NOTE — Progress Notes (Signed)
PROGRESS NOTE    [name]  RUE:[id number]  DOB: 04/22/1924  DOA: 03/22/2016 PCP: Fredirick MaudlinHAWKINS,EDWARD L, MD Outpatient Specialists:  Hospital course: [name] is a 82 y.o. female with medical history significant of HTN, Alzheimer's dementia, osteoporosis, dysphagia; who presents after having a fall at home 2 days ago. History is obtained from the patient's daughter [name] due to the patient's history of dementia. Apparently 2 days ago, the patient who normally ambulates in a wheelchair scooted out of the chair falling on her hip. Patient was not noted to have suffered any head trauma or loss consciousness. Patient appeared to be fine and showed no signs of discomfort initially. However, today patient seemed more uncomfortable while sitting in her wheelchair in the program for further evaluation  Assessment & Plan: Right subcapital hip fracture secondary to fall from wheelchair: Acute.Patient with fall from wheelchair to days ago. X-ray showing acute right hip fracture. Orthopedics tentatively scheduled 7/9 for OR.  - Admitted to a MedSurg bed  - Hip fracture protocol initiated - IV morphine prn pain  - Taken to OR 7/9 with Dr. Charlann Boxerlin - postop care per ortho  Dysphagia: Patient noted to be a high aspiration risk per daughter currently the third with a pured and thick liquid diet. Family and processed trying to see if patient can have a PEG tube placed.  - swallow eval with speech therapy Pt to be on Dys 1 diet - see recommendations.  Daughter prefers to feed patient herself.     Early Dehydration: As seen by the elevated BUN to creatinine ratio 25: 0.53. - Treated with gentle IVF.   Acute Blood Loss Anemia - mild, but stable.    Essential hypertension: Much better controlled now.  Blood pressures seen as high as 189/95 on admission. Suspect some aspect of pain contributing to elevated blood pressures. - IV hydralazine 5mg  as needed for SBP>180.  Hyperglycemia: Blood  glucose noted to be 173 on admission, normal A1c, likely stress reaction.  - Continue to monitor and add on a sliding scale insulin if needed  CBG (last 3)  No results for input(s): GLUCAP in the last 72 hours.  Dementia: Stable  DVT prophylaxis: per ortho team Code Status: DNR Family Communication: I had discussion with daughter (phone) - she says that she wants to take patient home tomorrow, declines SNF   Disposition Plan: DC home tomorrow with daughter - who accepts full responsibility to provide 24/7 care for patient.  Consults called: Orthopedics Admission status: Inpatient MedSurg  Subjective: Pt is nonverbal but appears in no apparent distress.   Objective: Filed Vitals:   03/25/16 1514 03/25/16 2038 03/26/16 0057 03/26/16 0605  BP: 147/50 155/55 149/55 141/43  Pulse: 91 94 97 83  Temp: 98.9 F (37.2 C) 99.3 F (37.4 C) 99.9 F (37.7 C) 98.3 F (36.8 C)  TempSrc: Axillary Axillary Axillary Axillary  Resp: 18 17 16 15   SpO2: 100% 96% 96% 96%    Intake/Output Summary (Last 24 hours) at 03/26/16 0945 Last data filed at 03/26/16 0900  Gross per 24 hour  Intake    360 ml  Output      0 ml  Net    360 ml   There were no vitals filed for this visit.  Exam:  General exam: awake, in no apparent distress Respiratory system: Clear. No increased work of breathing. Cardiovascular system: S1 & S2 heard.  Gastrointestinal system: Abdomen is nondistended, soft and nontender. Normal bowel sounds heard. Central  nervous system: Alert and oriented. No focal neurological deficits. Extremities: Right leg bandages clean,dry  Data Reviewed: Basic Metabolic Panel:  Recent Labs Lab 03/22/16 2221 03/23/16 0631 03/24/16 0557 03/25/16 0345 03/26/16 0402  NA 132* 135  --  138 136  K 4.2 3.8  --  3.7 3.8  CL 99* 101  --  106 106  CO2 25 25  --  26 25  GLUCOSE 173* 124*  --  140* 105*  BUN 25* 15  --  11 21*  CREATININE 0.53 0.53 0.45 0.57 0.60  CALCIUM 9.2 9.2  --  8.7*  8.3*   Liver Function Tests:  Recent Labs Lab 03/25/16 0345  AST 19  ALT 13*  ALKPHOS 58  BILITOT 0.7  PROT 5.1*  ALBUMIN 2.5*   No results for input(s): LIPASE, AMYLASE in the last 168 hours. No results for input(s): AMMONIA in the last 168 hours. CBC:  Recent Labs Lab 03/22/16 2221 03/23/16 0631 03/24/16 0557 03/25/16 0345 03/26/16 0402  WBC 8.9 8.1 7.9 7.8 7.9  NEUTROABS 7.5  --   --   --   --   HGB 12.9 12.7 11.3* 10.3* 9.4*  HCT 38.6 38.0 34.9* 30.5* 28.8*  MCV 90.2 90.0 91.6 89.7 91.4  PLT PLATELET CLUMPS NOTED ON SMEAR, COUNT APPEARS DECREASED 194 215 187 204   Cardiac Enzymes: No results for input(s): CKTOTAL, CKMB, CKMBINDEX, TROPONINI in the last 168 hours. BNP (last 3 results) No results for input(s): PROBNP in the last 8760 hours. CBG: No results for input(s): GLUCAP in the last 168 hours.  Recent Results (from the past 240 hour(s))  MRSA PCR Screening     Status: None   Collection Time: 03/24/16  4:24 AM  Result Value Ref Range Status   MRSA by PCR NEGATIVE NEGATIVE Final    Comment:        The GeneXpert MRSA Assay (FDA approved for NASAL specimens only), is one component of a comprehensive MRSA colonization surveillance program. It is not intended to diagnose MRSA infection nor to guide or monitor treatment for MRSA infections.     Studies: No results found.  Scheduled Meds: . docusate sodium  100 mg Oral BID  . enoxaparin (LOVENOX) injection  30 mg Subcutaneous Q24H  . ferrous sulfate  325 mg Oral TID PC  . ketoconazole  1 application Topical Daily  . multivitamin with minerals  1 tablet Oral Daily   Continuous Infusions:   Principal Problem:   Hip fracture (HCC) Active Problems:   Dementia   Hypertension   Dehydration   Hyperglycemia   Dysphagia   Fall from wheelchair  Standley Dakins, MD, FAAFP [facility] Pager [phone]  If 7PM-7AM, please contact night-coverage [email or web address] Password [email or web address] 03/26/2016,  9:45 AM    LOS: 4 days

## 2016-03-26 NOTE — Anesthesia Postprocedure Evaluation (Signed)
Anesthesia Post Note  Patient: [name] C Bouchard  Procedure(s) Performed: Procedure(s) (LRB): ARTHROPLASTY BIPOLAR HIP (HEMIARTHROPLASTY) (Right)  Patient location during evaluation: PACU Anesthesia Type: General Level of consciousness: awake and alert and patient cooperative Pain management: pain level controlled Vital Signs Assessment: post-procedure vital signs reviewed and stable Respiratory status: spontaneous breathing and respiratory function stable Cardiovascular status: stable Anesthetic complications: no    Last Vitals:  Filed Vitals:   03/26/16 0057 03/26/16 0605  BP: 149/55 141/43  Pulse: 97 83  Temp: 37.7 C 36.8 C  Resp: 16 15    Last Pain:  Filed Vitals:   03/26/16 0605  PainSc: Asleep                 Tamim Skog S

## 2016-03-26 NOTE — Progress Notes (Signed)
Patient ID: [name], female   DOB: 01/07/1924, 82 y.o.   MRN: [id number] Subjective: 2 Days Post-Op Procedure(s) (LRB): ARTHROPLASTY BIPOLAR HIP (HEMIARTHROPLASTY) (Right)    Patient asleep.  Dementia limiting subjective assessment  Objective:   VITALS:   Filed Vitals:   03/26/16 0605 03/26/16 1300  BP: 141/43 124/64  Pulse: 83 85  Temp: 98.3 F (36.8 C) 98.3 F (36.8 C)  Resp: 15 16    Incision: dressing C/D/I  LABS  Recent Labs  03/24/16 0557 03/25/16 0345 03/26/16 0402  HGB 11.3* 10.3* 9.4*  HCT 34.9* 30.5* 28.8*  WBC 7.9 7.8 7.9  PLT 215 187 204     Recent Labs  03/24/16 0557 03/25/16 0345 03/26/16 0402  NA  --  138 136  K  --  3.7 3.8  BUN  --  11 21*  CREATININE 0.45 0.57 0.60  GLUCOSE  --  140* 105*    No results for input(s): LABPT, INR in the last 72 hours.   Assessment/Plan: 2 Days Post-Op Procedure(s) (LRB): ARTHROPLASTY BIPOLAR HIP (HEMIARTHROPLASTY) (Right)   Up with therapy  Discharge planning per family and primary team  RTC in 2 weeks WBAT RLE

## 2016-03-26 NOTE — Progress Notes (Signed)
MD and PT, please see sticky notes placed on front of digital chart. Thanks

## 2016-03-27 DIAGNOSIS — R1310 Dysphagia, unspecified: Secondary | ICD-10-CM

## 2016-03-27 DIAGNOSIS — I10 Essential (primary) hypertension: Secondary | ICD-10-CM

## 2016-03-27 DIAGNOSIS — F0390 Unspecified dementia without behavioral disturbance: Secondary | ICD-10-CM

## 2016-03-27 DIAGNOSIS — S72001D Fracture of unspecified part of neck of right femur, subsequent encounter for closed fracture with routine healing: Secondary | ICD-10-CM

## 2016-03-27 MED ORDER — AMLODIPINE BESYLATE 5 MG PO TABS: 5.0000 mg | ORAL_TABLET | Freq: Every day | ORAL | Status: DC

## 2016-03-27 MED ORDER — FLEET ENEMA 7-19 GM/118ML RE ENEM
1.0000 | ENEMA | Freq: Once | RECTAL | Status: AC
Start: 2016-03-27 — End: 2016-03-27
  Administered 2016-03-27: 1 via RECTAL
  Filled 2016-03-27: qty 1

## 2016-03-27 MED ORDER — ASPIRIN 81 MG PO CHEW
81.0000 mg | CHEWABLE_TABLET | Freq: Two times a day (BID) | ORAL | Status: DC
  Filled 2016-03-27: qty 1

## 2016-03-27 MED ORDER — ASPIRIN 81 MG PO CHEW: 81.0000 mg | CHEWABLE_TABLET | Freq: Two times a day (BID) | ORAL | Status: DC

## 2016-03-27 MED ORDER — PANTOPRAZOLE SODIUM 40 MG PO TBEC
40.0000 mg | DELAYED_RELEASE_TABLET | Freq: Every day | ORAL | Status: DC
  Administered 2016-03-27: 40 mg via ORAL
  Filled 2016-03-27: qty 1

## 2016-03-27 MED ORDER — ACETAMINOPHEN 500 MG PO TABS: 1000.0000 mg | ORAL_TABLET | Freq: Three times a day (TID) | ORAL | Status: DC | PRN

## 2016-03-27 MED ORDER — POLYETHYLENE GLYCOL 3350 17 G PO PACK: 17.0000 g | PACK | Freq: Every day | ORAL | Status: DC | PRN

## 2016-03-27 MED ORDER — PANTOPRAZOLE SODIUM 40 MG PO TBEC: 40.0000 mg | DELAYED_RELEASE_TABLET | Freq: Every day | ORAL | Status: DC

## 2016-03-27 MED ORDER — FERROUS SULFATE 325 (65 FE) MG PO TABS: 325.0000 mg | ORAL_TABLET | Freq: Two times a day (BID) | ORAL | Status: DC

## 2016-03-27 MED ORDER — PANTOPRAZOLE SODIUM 40 MG PO PACK: 40.0000 mg | PACK | Freq: Every day | ORAL | Status: DC

## 2016-03-27 MED ORDER — AMLODIPINE 1 MG/ML ORAL SUSPENSION: 5.0000 mg | Freq: Every day | ORAL | Status: DC

## 2016-03-27 MED ORDER — TRAMADOL 5 MG/ML ORAL SUSPENSION: 12.5000 mg | Freq: Four times a day (QID) | ORAL | Status: DC | PRN

## 2016-03-27 MED ORDER — FERROUS SULFATE 300 (60 FE) MG/5ML PO SYRP: 300.0000 mg | ORAL_SOLUTION | Freq: Two times a day (BID) | ORAL | Status: DC

## 2016-03-27 NOTE — Progress Notes (Signed)
Physical Therapy Treatment Patient Details Name: [name] MRN: [id number] DOB: 12/01/1923 Today's Date: 03/27/2016    History of Present Illness Pt admitted with R hip fx after slipping out of her chair, underwent R hemiarthroplasty. PMH: advanced Alzheimers, HTN, osteoporosis, dysphagia.    PT Comments    Pt's daughter was present during today's session and participated in transfer training. Pt's daughter required frequent verbal cueing and physical assistance to complete bed mobility and stand pivot transfer. Pt did not attempt to weight bear through bilateral LEs in standing during transfer. PT and pt's daughter again discussed DME that they have at home and PT [facility] as [facility] is the safer method. Pt's daughter stated that the lift needed to be repaired, and that she and her husband would continue to physically lift her as they have been doing. Pt would continue to benefit from skilled physical therapy services at this time while inpatient and after d/c. PT [facility]   Follow Up Recommendations  Home health PT     Equipment Recommendations  Other (comment)    Recommendations for Other Services       Precautions / Restrictions Precautions Precautions: Fall;Posterior Hip Precaution Booklet Issued: Yes (comment) Precaution Comments: educated daughter in precautions/positioning during evaluation session Restrictions Weight Bearing Restrictions: Yes RLE Weight Bearing: Weight bearing as tolerated    Mobility  Bed Mobility Overal bed mobility: Needs Assistance;+2 for physical assistance Bed Mobility: Supine to Sit     Supine to sit: +2 for physical assistance;Total assist     General bed mobility comments: Pt's daughter participated in bed mobility, assisting pt at bilateral LEs  Transfers Overall transfer level: Needs assistance Equipment used: 2 person hand held assist Transfers: Stand Pivot Transfers   Stand pivot  transfers: +2 physical assistance;Total assist       General transfer comment: Pt's daughter participated in stand pivot transfer of pt from bed to recliner chair and back to her bed. Pt's daughter required verbal cueing and physical assistance to complete the transfer as pt did not participate or weight bear through LEs.   Ambulation/Gait                 Stairs            Wheelchair Mobility    Modified Rankin (Stroke Patients Only)       Balance Overall balance assessment: Needs assistance Sitting-balance support: Bilateral upper extremity supported Sitting balance-Leahy Scale: Poor Sitting balance - Comments: posterior lean initially     Standing balance-Leahy Scale: Zero Standing balance comment: Pt did not attempt to weight bear through bilateral LEs in standing.                    Cognition Arousal/Alertness: Awake/alert Behavior During Therapy: WFL for tasks assessed/performed Overall Cognitive Status: History of cognitive impairments - at baseline                      Exercises      General Comments        Pertinent Vitals/Pain Pain Assessment: Faces Faces Pain Scale: No hurt Pain Intervention(s): Monitored during session    Home Living                      Prior Function            PT Goals (current goals can now be found in the care  plan section) Acute Rehab PT Goals Patient Stated Goal: Daughter would like to take pt home PT Goal Formulation: With family Time For Goal Achievement: 04/01/16 Potential to Achieve Goals: Fair Progress towards PT goals: Progressing toward goals    Frequency  Min 5X/week    PT Plan Current plan remains appropriate    Co-evaluation             End of Session Equipment Utilized During Treatment: Gait belt Activity Tolerance: Patient tolerated treatment well Patient left: in bed;with bed alarm set;with family/visitor present;with call bell/phone within reach      Time: 1610-9604 PT Time Calculation (min) (ACUTE ONLY): 26 min  Charges:  $Therapeutic Activity: 23-37 mins                    G CodesAlessandra Bevels Hoang Reich 04-07-2016, 12:02 PM Deborah Chalk, PT, DPT [phone]

## 2016-03-27 NOTE — Discharge Summary (Addendum)
PATIENT DETAILS Name: [name] Age: 82 y.o. Sex: female Date of Birth: 02/01/1924 MRN: [id number]. Admitting Physician: Clydie Braunondell A Smith, MD WUJ:WJXBJYN,WGNFAOPCP:HAWKINS,EDWARD L, MD  Admit Date: 03/22/2016 Discharge date: 03/27/2016  Recommendations for Outpatient Follow-up:  1. Suggest involvement by palliative care services in the near future.  2. Reason ensure speech therapy follow-up-currently on a dysphagia 1/pure diet 3. Please repeat CBC/BMET at next visit 4. Aspirin 81 mg chewable tablet twice a day for a total of 4 weeks for VTE prophylaxis  PRIMARY DISCHARGE DIAGNOSIS:  Principal Problem:   Hip fracture (HCC) Active Problems:   Dementia   Hypertension   Dehydration   Hyperglycemia   Dysphagia   Fall from wheelchair      PAST MEDICAL HISTORY: Past Medical History  Diagnosis Date  . Hypertension   . Reflux   . Schizophrenia (HCC)   . Alzheimer disease   . Chronic back pain   . Clostridium difficile colitis   . Hiatal hernia   . Vertebral compression fracture (HCC)     DISCHARGE MEDICATIONS: Current Discharge Medication List    START taking these medications   Details  acetaminophen (TYLENOL) 500 MG tablet Take 2 tablets (1,000 mg total) by mouth every 8 (eight) hours as needed for mild pain (or Fever >/= 101).    amLODipine (NORVASC) 1 mg/mL SUSP oral suspension Take 5 mLs (5 mg total) by mouth daily. Qty: 100 mL, Refills: 0    aspirin 81 MG chewable tablet Chew 1 tablet (81 mg total) by mouth 2 (two) times daily. Qty: 60 tablet, Refills: 0    ferrous sulfate 300 (60 Fe) MG/5ML syrup Take 5 mLs (300 mg total) by mouth 2 (two) times daily with a meal. Qty: 150 mL, Refills: 0    pantoprazole sodium (PROTONIX) 40 mg/20 mL PACK Take 20 mLs (40 mg total) by mouth daily. Qty: 60 each, Refills: 0    polyethylene glycol (MIRALAX / GLYCOLAX) packet Take 17 g by mouth daily as needed for mild constipation. Qty: 14 each, Refills: 0      CONTINUE these medications  which have CHANGED   Details  traMADol (ULTRAM) 5 mg/mL SUSP Take 2.5-4 mLs (12.5-20 mg total) by mouth every 6 (six) hours as needed for moderate pain. Qty: 20 mL, Refills: 0      CONTINUE these medications which have NOT CHANGED   Details  ketoconazole (NIZORAL) 2 % cream Apply 1 application topically daily. Under breasts for yeast    Multiple Vitamin (MULTIVITAMIN) capsule Take 1 capsule by mouth daily. Put in applesauce or shakes        ALLERGIES:   Allergies  Allergen Reactions  . Codeine Other (See Comments)    UNKNOWN REACTION    BRIEF HPI:  See H&P, Labs, Consult and Test reports for all details in brief, patient Is a 82 year old female with history of advanced Alzheimer's dementia, hypertension, chronic dysphagia on a pure diet for the past 2 years presented to the hospital after she fell from a wheelchair, she was found to have right hip fracture him and was admitted for further evaluation and treatment.   CONSULTATIONS:   orthopedic surgery  PERTINENT RADIOLOGIC STUDIES: Ct Abdomen Wo Contrast  03/15/2016  CLINICAL DATA:  Aspiration and dysphagia. Evaluation for potential gastrostomy tube placement. EXAM: CT ABDOMEN WITHOUT CONTRAST TECHNIQUE: Multidetector CT imaging of the abdomen was performed following the standard protocol without IV contrast. COMPARISON:  None. FINDINGS: Lower chest:  Mild scarring at both lung bases.  Hepatobiliary: Unenhanced appearance of the liver is unremarkable. There are multiple calcified gallstones within the gallbladder. No evidence of biliary ductal dilatation. Pancreas: Unremarkable by unenhanced CT. Spleen: Normal size. Adrenals/Urinary Tract: Multiple bilateral renal cysts present which appear of simple fluid density. No adrenal masses. Stomach/Bowel: There is a moderate-sized hiatal hernia. The rest of the stomach is fairly high in position and distended with oral contrast. The transverse colon is located immediately inferior to the  stomach. Anatomy and positioning of the stomach is not ideal for percutaneous gastrostomy. There may be some relative element of gastric outlet obstruction with very prominent thickened folds of the proximal duodenum noted suggestive of underlying peptic disease. No overt duodenal ulceration or perforation is identified. Subtle mass of the duodenum cannot be excluded by CT. There is some contrast that enters small bowel. Moderate stool was present in the colon. No free air or focal abscess is identified in the abdomen. Vascular/Lymphatic: The aorta is heavily calcified without evidence of aneurysmal disease. No enlarged lymph nodes are seen. Other: No hernias identified. Musculoskeletal: Severe kyphosis and scoliosis present. Bones are osteopenic. Compression fracture of the T12 vertebral body present with approximately 70% loss of vertebral body height. The L1 vertebral body shows compression with approximately 40% loss of vertebral body height. The L4 vertebral body demonstrates approximately 25% loss of height. No bony lesions are identified. Vacuum disc present at L2-3 and L3-4. Spondylosis present throughout the lower thoracic and lumbar spine. IMPRESSION: 1. Moderate hiatal hernia and fairly high positioning of a distended stomach. Anatomy is not favorable for percutaneous gastrostomy. There may be an element of relative gastric outlet obstruction based on distension of the stomach and prominent thickened folds within the proximal duodenum. Findings are suggestive of peptic disease. Overt ulceration of the duodenum is not identified by CT. Duodenal mass cannot be excluded by CT. 2. Aortic atherosclerosis without aneurysm identified. 3. Osteopenic compression fractures at the T12, L1 and L4 levels, as above. The T12 vertebral body shows the most severe loss of height. Associated spondylosis and scoliosis present. Electronically Signed   By: Irish Lack M.D.   On: 03/15/2016 15:40   Dg Chest 1  View  03/22/2016  CLINICAL DATA:  Fall, right leg pain EXAM: CHEST 1 VIEW COMPARISON:  06/09/2014 FINDINGS: Right basilar scarring/ atelectasis. No pleural effusion or pneumothorax. Cardiomegaly. Degenerative changes the visualized thoracolumbar spine. IMPRESSION: No evidence of acute cardiopulmonary disease. Electronically Signed   By: Charline Bills M.D.   On: 03/22/2016 21:24   Dg Hip Unilat  With Pelvis 2-3 Views Right  03/22/2016  CLINICAL DATA:  Fall, right leg pain EXAM: DG HIP (WITH OR WITHOUT PELVIS) 2-3V RIGHT COMPARISON:  None. FINDINGS: Subcapital right hip fracture.  Foreshortening with varus deformity. Visualized bony pelvis appears intact. IMPRESSION: Subcapital right hip fracture, as above. Electronically Signed   By: Charline Bills M.D.   On: 03/22/2016 21:23   Dg Femur, Min 2 Views Right  03/22/2016  CLINICAL DATA:  Fall, right leg pain EXAM: RIGHT FEMUR 2 VIEWS COMPARISON:  None. FINDINGS: Subcapital right hip fracture.  Foreshortening with varus deformity. Visualized bony pelvis appears intact. Distal femur appears intact. IMPRESSION: Subcapital right hip fracture, as above. Electronically Signed   By: Charline Bills M.D.   On: 03/22/2016 21:23     PERTINENT LAB RESULTS: CBC:  Recent Labs  03/25/16 0345 03/26/16 0402  WBC 7.8 7.9  HGB 10.3* 9.4*  HCT 30.5* 28.8*  PLT 187 204   CMET CMP  Component Value Date/Time   NA 136 03/26/2016 0402   K 3.8 03/26/2016 0402   CL 106 03/26/2016 0402   CO2 25 03/26/2016 0402   GLUCOSE 105* 03/26/2016 0402   BUN 21* 03/26/2016 0402   CREATININE 0.60 03/26/2016 0402   CALCIUM 8.3* 03/26/2016 0402   PROT 5.1* 03/25/2016 0345   ALBUMIN 2.5* 03/25/2016 0345   AST 19 03/25/2016 0345   ALT 13* 03/25/2016 0345   ALKPHOS 58 03/25/2016 0345   BILITOT 0.7 03/25/2016 0345   GFRNONAA >60 03/26/2016 0402   GFRAA >60 03/26/2016 0402    GFR CrCl cannot be calculated (Unknown ideal weight.). No results for input(s): LIPASE,  AMYLASE in the last 72 hours. No results for input(s): CKTOTAL, CKMB, CKMBINDEX, TROPONINI in the last 72 hours. Invalid input(s): POCBNP No results for input(s): DDIMER in the last 72 hours. No results for input(s): HGBA1C in the last 72 hours. No results for input(s): CHOL, HDL, LDLCALC, TRIG, CHOLHDL, LDLDIRECT in the last 72 hours. No results for input(s): TSH, T4TOTAL, T3FREE, THYROIDAB in the last 72 hours.  Invalid input(s): FREET3 No results for input(s): VITAMINB12, FOLATE, FERRITIN, TIBC, IRON, RETICCTPCT in the last 72 hours. Coags: No results for input(s): INR in the last 72 hours.  Invalid input(s): PT Microbiology: Recent Results (from the past 240 hour(s))  MRSA PCR Screening     Status: None   Collection Time: 03/24/16  4:24 AM  Result Value Ref Range Status   MRSA by PCR NEGATIVE NEGATIVE Final    Comment:        The GeneXpert MRSA Assay (FDA approved for NASAL specimens only), is one component of a comprehensive MRSA colonization surveillance program. It is not intended to diagnose MRSA infection nor to guide or monitor treatment for MRSA infections.      BRIEF HOSPITAL COURSE:   Principal Problem: Right Hip fracture: Secondary to a mechanical fall, patient was evaluated by orthopedics and underwent surgical correction on 7/9. Postoperatively, hospital course has been slowly uncomplicated-except for issues with dementia/delirium and mild perioperative blood loss anemia. Patient was evaluated by rehabilitation services, SNF was recommended-however patient's daughter is keen on taking back home. Spoke with Dr. Durene Romans today, recommendations for weightbearing as tolerated to the right lower extremity, since patient is going home and family will have difficulty giving Lovenox injections, orthopedics now recommends 81 mg of chewable aspirin twice a day for a total of 4 weeks. Also note, patient was already taking tramadol prior to this hospitalization-I have  given the patient's family a few days supply of tramadol to deal with episodes of acute pain related to surgery. Please ensure patient follows-up with the [facility] in 2 weeks.  Active Problems: Anemia: Mild perioperative blood loss anemia-does not require PRBC transfusion. Continue iron supplementation. Follow-up with PCP and repeat CBC in the next few weeks.   Dysphagia: This is a chronic issue, patient was on a pure diet even prior to this admission for almost 2 years. Seen by speech therapy, pure diet reinitiated. This M.D. spoke with patient's daughter over the phone this morning, subsequently at bedside, she understands the risk of aspiration. We will add on SLP follow-up to home health orders.  Dementia with mild delirium: Patient has advanced dementia-she is unable to follow any commands. I suspect this is her baseline.  Hypertension: Blood pressure fluctuating- suspect secondary to pain-we will start low-dose amlodipine on discharge-this can be further titrated his PCP at next visit.  Constipation: Will be provided one  dose of Fleet enema prior to discharge, I have encouraged patient's daughter to use MiraLAX as on a needed basis.  Palliative care: Elderly 82 year old female with advanced dementia, admitted with mechanical fall and resultant right hip fracture. She underwent right hip repair this hospitalization. However she is very frail, with advanced dementia has chronic dysphagia-overall prognosis is poor and remains at risk of further decompensation in the near future. I will recommend to PCP that we initiate outpatient palliative care services at his discretion. Currently remains a full code (spoke with daughter)  TODAY-DAY OF DISCHARGE:  Subjective:   [name] todayContinues to be very pleasantly confused.  Objective:   Blood pressure 182/65, pulse 82, temperature 98.8 F (37.1 C), temperature source Axillary, resp. rate 15, SpO2 96 %.  Intake/Output Summary (Last 24 hours) at  03/27/16 1203 Last data filed at 03/27/16 0616  Gross per 24 hour  Intake 908.67 ml  Output      0 ml  Net 908.67 ml   There were no vitals filed for this visit.  Exam Awake-but confused , No new F.N deficits, Normal affect Mucarabones.AT,PERRAL Supple Neck,No JVD, No cervical lymphadenopathy appriciated.  Symmetrical Chest wall movement, Good air movement bilaterally, CTAB RRR,No Gallops,Rubs or new Murmurs, No Parasternal Heave +ve B.Sounds, Abd Soft, Non tender, No organomegaly appriciated, No rebound -guarding or rigidity. No Cyanosis, Clubbing or edema, No new Rash or bruise  DISCHARGE CONDITION: Stable  DISPOSITION: Home with home health services  DISCHARGE INSTRUCTIONS:    Activity:  Weightbearing as tolerated to right lower extremity   Get Medicines reviewed and adjusted: Please take all your medications with you for your next visit with your Primary MD  Please request your Primary MD to go over all hospital tests and procedure/radiological results at the follow up, please ask your Primary MD to get all Hospital records sent to his/her office.  If you experience worsening of your admission symptoms, develop shortness of breath, life threatening emergency, suicidal or homicidal thoughts you must seek medical attention immediately by calling 911 or calling your MD immediately  if symptoms less severe.  You must read complete instructions/literature along with all the possible adverse reactions/side effects for all the Medicines you take and that have been prescribed to you. Take any new Medicines after you have completely understood and accpet all the possible adverse reactions/side effects.   Do not drive when taking Pain medications.   Do not take more than prescribed Pain, Sleep and Anxiety Medications  Special Instructions: If you have smoked or chewed Tobacco  in the last 2 yrs please stop smoking, stop any regular Alcohol  and or any Recreational drug use.  Wear Seat  belts while driving.  Please note  You were cared for by a hospitalist during your hospital stay. Once you are discharged, your primary care physician will handle any further medical issues. Please note that NO REFILLS for any discharge medications will be authorized once you are discharged, as it is imperative that you return to your primary care physician (or establish a relationship with a primary care physician if you do not have one) for your aftercare needs so that they can reassess your need for medications and monitor your lab values.   Diet recommendation: See below   Discharge Instructions    Call MD for:  redness, tenderness, or signs of infection (pain, swelling, redness, odor or green/yellow discharge around incision site)    Complete by:  As directed  Call MD for:  severe uncontrolled pain    Complete by:  As directed      Change dressing (specify)    Complete by:  As directed   Dressing to remain in place until follow in clinic in 2 weeks. Dressing is waterproof and may shower with it in place.     Diet general    Complete by:  As directed   Diet recommendations: Dysphagia 1 (puree);Nectar-thick liquid Liquids provided via: Cup;Teaspoon Medication Administration: Crushed with puree Supervision: Staff to assist with self feeding;Full supervision/cueing for compensatory strategies;Trained caregiver to feed patient Compensations: Slow rate;Small sips/bites;Minimize environmental distractions Postural Changes and/or Swallow Maneuvers: Seated upright 90 degrees;Upright 30-60 min after meal     Increase activity slowly    Complete by:  As directed       Weight Bearing As Tolerated on the right leg.      Weight bearing as tolerated    Complete by:  As directed   Laterality:  right  Extremity:  Lower           Follow-up Information    Follow up with Shelda Pal, MD. Schedule an appointment as soon as possible for a visit in 2 weeks.   Specialty:  Orthopedic  Surgery   Contact information:   [address] [place] [address] [phone]       Follow up with HAWKINS,EDWARD L, MD. Schedule an appointment as soon as possible for a visit in 1 week.   Specialty:  Pulmonary Disease   Why:  Hospital follow up   Contact information:   [address] [place] [address] [phone]      Total Time spent on discharge equals  45 minutes.  SignedJeoffrey Massed 03/27/2016 12:03 PM

## 2016-03-27 NOTE — Progress Notes (Signed)
RN called [facility] per family request and helped pt dressed. Pt is ready to discharge.

## 2016-03-28 DIAGNOSIS — F209 Schizophrenia, unspecified: Secondary | ICD-10-CM | POA: Diagnosis not present

## 2016-03-28 DIAGNOSIS — S72041D Displaced fracture of base of neck of right femur, subsequent encounter for closed fracture with routine healing: Secondary | ICD-10-CM | POA: Diagnosis not present

## 2016-03-28 DIAGNOSIS — I10 Essential (primary) hypertension: Secondary | ICD-10-CM | POA: Diagnosis not present

## 2016-03-28 DIAGNOSIS — W1843XD Slipping, tripping and stumbling without falling due to stepping from one level to another, subsequent encounter: Secondary | ICD-10-CM | POA: Diagnosis not present

## 2016-03-28 DIAGNOSIS — F0280 Dementia in other diseases classified elsewhere without behavioral disturbance: Secondary | ICD-10-CM | POA: Diagnosis not present

## 2016-03-28 DIAGNOSIS — Z96641 Presence of right artificial hip joint: Secondary | ICD-10-CM | POA: Diagnosis not present

## 2016-03-28 DIAGNOSIS — G309 Alzheimer's disease, unspecified: Secondary | ICD-10-CM | POA: Diagnosis not present

## 2016-03-28 DIAGNOSIS — M810 Age-related osteoporosis without current pathological fracture: Secondary | ICD-10-CM | POA: Diagnosis not present

## 2016-03-28 DIAGNOSIS — K219 Gastro-esophageal reflux disease without esophagitis: Secondary | ICD-10-CM | POA: Diagnosis not present

## 2016-03-28 DIAGNOSIS — R1310 Dysphagia, unspecified: Secondary | ICD-10-CM | POA: Diagnosis not present

## 2016-04-05 DIAGNOSIS — S72001D Fracture of unspecified part of neck of right femur, subsequent encounter for closed fracture with routine healing: Secondary | ICD-10-CM | POA: Diagnosis not present

## 2016-04-05 DIAGNOSIS — Z96641 Presence of right artificial hip joint: Secondary | ICD-10-CM | POA: Diagnosis not present

## 2016-04-05 DIAGNOSIS — F0390 Unspecified dementia without behavioral disturbance: Secondary | ICD-10-CM | POA: Diagnosis not present

## 2016-04-05 DIAGNOSIS — W19XXXD Unspecified fall, subsequent encounter: Secondary | ICD-10-CM | POA: Diagnosis not present

## 2016-04-05 DIAGNOSIS — Z9181 History of falling: Secondary | ICD-10-CM | POA: Diagnosis not present

## 2016-04-08 DIAGNOSIS — R279 Unspecified lack of coordination: Secondary | ICD-10-CM | POA: Diagnosis not present

## 2016-04-08 DIAGNOSIS — Z7401 Bed confinement status: Secondary | ICD-10-CM | POA: Diagnosis not present

## 2016-04-08 DIAGNOSIS — Z471 Aftercare following joint replacement surgery: Secondary | ICD-10-CM | POA: Diagnosis not present

## 2016-04-08 DIAGNOSIS — Z96649 Presence of unspecified artificial hip joint: Secondary | ICD-10-CM | POA: Diagnosis not present

## 2016-04-09 DIAGNOSIS — Z96641 Presence of right artificial hip joint: Secondary | ICD-10-CM | POA: Diagnosis not present

## 2016-04-09 DIAGNOSIS — Z9181 History of falling: Secondary | ICD-10-CM | POA: Diagnosis not present

## 2016-04-09 DIAGNOSIS — S72001D Fracture of unspecified part of neck of right femur, subsequent encounter for closed fracture with routine healing: Secondary | ICD-10-CM | POA: Diagnosis not present

## 2016-04-09 DIAGNOSIS — F0390 Unspecified dementia without behavioral disturbance: Secondary | ICD-10-CM | POA: Diagnosis not present

## 2016-04-09 DIAGNOSIS — W19XXXD Unspecified fall, subsequent encounter: Secondary | ICD-10-CM | POA: Diagnosis not present

## 2016-04-11 DIAGNOSIS — F0390 Unspecified dementia without behavioral disturbance: Secondary | ICD-10-CM | POA: Diagnosis not present

## 2016-04-11 DIAGNOSIS — Z9181 History of falling: Secondary | ICD-10-CM | POA: Diagnosis not present

## 2016-04-11 DIAGNOSIS — W19XXXD Unspecified fall, subsequent encounter: Secondary | ICD-10-CM | POA: Diagnosis not present

## 2016-04-11 DIAGNOSIS — Z96641 Presence of right artificial hip joint: Secondary | ICD-10-CM | POA: Diagnosis not present

## 2016-04-11 DIAGNOSIS — S72001D Fracture of unspecified part of neck of right femur, subsequent encounter for closed fracture with routine healing: Secondary | ICD-10-CM | POA: Diagnosis not present

## 2016-04-17 DIAGNOSIS — S72001D Fracture of unspecified part of neck of right femur, subsequent encounter for closed fracture with routine healing: Secondary | ICD-10-CM | POA: Diagnosis not present

## 2016-04-17 DIAGNOSIS — F0390 Unspecified dementia without behavioral disturbance: Secondary | ICD-10-CM | POA: Diagnosis not present

## 2016-04-17 DIAGNOSIS — Z96641 Presence of right artificial hip joint: Secondary | ICD-10-CM | POA: Diagnosis not present

## 2016-04-17 DIAGNOSIS — W19XXXD Unspecified fall, subsequent encounter: Secondary | ICD-10-CM | POA: Diagnosis not present

## 2016-04-17 DIAGNOSIS — Z9181 History of falling: Secondary | ICD-10-CM | POA: Diagnosis not present

## 2016-04-24 DIAGNOSIS — F0390 Unspecified dementia without behavioral disturbance: Secondary | ICD-10-CM | POA: Diagnosis not present

## 2016-04-24 DIAGNOSIS — W19XXXD Unspecified fall, subsequent encounter: Secondary | ICD-10-CM | POA: Diagnosis not present

## 2016-04-24 DIAGNOSIS — Z9181 History of falling: Secondary | ICD-10-CM | POA: Diagnosis not present

## 2016-04-24 DIAGNOSIS — Z96641 Presence of right artificial hip joint: Secondary | ICD-10-CM | POA: Diagnosis not present

## 2016-04-24 DIAGNOSIS — S72001D Fracture of unspecified part of neck of right femur, subsequent encounter for closed fracture with routine healing: Secondary | ICD-10-CM | POA: Diagnosis not present

## 2016-05-02 DIAGNOSIS — F0390 Unspecified dementia without behavioral disturbance: Secondary | ICD-10-CM | POA: Diagnosis not present

## 2016-05-02 DIAGNOSIS — Z96641 Presence of right artificial hip joint: Secondary | ICD-10-CM | POA: Diagnosis not present

## 2016-05-02 DIAGNOSIS — W19XXXD Unspecified fall, subsequent encounter: Secondary | ICD-10-CM | POA: Diagnosis not present

## 2016-05-02 DIAGNOSIS — Z9181 History of falling: Secondary | ICD-10-CM | POA: Diagnosis not present

## 2016-05-02 DIAGNOSIS — S72001D Fracture of unspecified part of neck of right femur, subsequent encounter for closed fracture with routine healing: Secondary | ICD-10-CM | POA: Diagnosis not present

## 2016-05-08 DIAGNOSIS — Z96649 Presence of unspecified artificial hip joint: Secondary | ICD-10-CM | POA: Diagnosis not present

## 2016-05-08 DIAGNOSIS — R279 Unspecified lack of coordination: Secondary | ICD-10-CM | POA: Diagnosis not present

## 2016-05-08 DIAGNOSIS — Z471 Aftercare following joint replacement surgery: Secondary | ICD-10-CM | POA: Diagnosis not present

## 2016-05-08 DIAGNOSIS — Z743 Need for continuous supervision: Secondary | ICD-10-CM | POA: Diagnosis not present

## 2016-05-09 DIAGNOSIS — F0390 Unspecified dementia without behavioral disturbance: Secondary | ICD-10-CM | POA: Diagnosis not present

## 2016-05-09 DIAGNOSIS — Z9181 History of falling: Secondary | ICD-10-CM | POA: Diagnosis not present

## 2016-05-09 DIAGNOSIS — Z96641 Presence of right artificial hip joint: Secondary | ICD-10-CM | POA: Diagnosis not present

## 2016-05-09 DIAGNOSIS — S72001D Fracture of unspecified part of neck of right femur, subsequent encounter for closed fracture with routine healing: Secondary | ICD-10-CM | POA: Diagnosis not present

## 2016-05-09 DIAGNOSIS — W19XXXD Unspecified fall, subsequent encounter: Secondary | ICD-10-CM | POA: Diagnosis not present

## 2016-06-10 ENCOUNTER — Encounter (HOSPITAL_COMMUNITY): Payer: Self-pay | Admitting: Emergency Medicine

## 2016-06-10 ENCOUNTER — Emergency Department (HOSPITAL_COMMUNITY)
Admission: EM | Admit: 2016-06-10 | Discharge: 2016-06-10 | Disposition: A | Discharge status: Home / Self Care | Payer: Medicare Other | Attending: Emergency Medicine | Admitting: Emergency Medicine

## 2016-06-10 ENCOUNTER — Emergency Department (HOSPITAL_COMMUNITY): Payer: Medicare Other

## 2016-06-10 DIAGNOSIS — Z7982 Long term (current) use of aspirin: Secondary | ICD-10-CM | POA: Diagnosis not present

## 2016-06-10 DIAGNOSIS — Y939 Activity, unspecified: Secondary | ICD-10-CM | POA: Diagnosis not present

## 2016-06-10 DIAGNOSIS — S91001A Unspecified open wound, right ankle, initial encounter: Secondary | ICD-10-CM | POA: Diagnosis not present

## 2016-06-10 DIAGNOSIS — Y999 Unspecified external cause status: Secondary | ICD-10-CM | POA: Diagnosis not present

## 2016-06-10 DIAGNOSIS — I10 Essential (primary) hypertension: Secondary | ICD-10-CM | POA: Diagnosis not present

## 2016-06-10 DIAGNOSIS — X58XXXA Exposure to other specified factors, initial encounter: Secondary | ICD-10-CM | POA: Diagnosis not present

## 2016-06-10 DIAGNOSIS — Y929 Unspecified place or not applicable: Secondary | ICD-10-CM | POA: Insufficient documentation

## 2016-06-10 DIAGNOSIS — Z96641 Presence of right artificial hip joint: Secondary | ICD-10-CM | POA: Diagnosis not present

## 2016-06-10 DIAGNOSIS — L03115 Cellulitis of right lower limb: Secondary | ICD-10-CM

## 2016-06-10 DIAGNOSIS — G309 Alzheimer's disease, unspecified: Secondary | ICD-10-CM | POA: Diagnosis not present

## 2016-06-10 MED ORDER — BACITRACIN ZINC 500 UNIT/GM EX OINT: 1.0000 "application " | TOPICAL_OINTMENT | Freq: Two times a day (BID) | CUTANEOUS | 0 refills | Status: DC

## 2016-06-10 MED ORDER — NALOXONE HCL 0.4 MG/ML IJ SOLN
0.4000 mg | Freq: Once | INTRAMUSCULAR | Status: DC
Start: 2016-06-10 — End: 2016-06-10

## 2016-06-10 MED ORDER — CEPHALEXIN 250 MG PO CAPS: 500.0000 mg | ORAL_CAPSULE | Freq: Once | ORAL | Status: DC

## 2016-06-10 MED ORDER — CEPHALEXIN 250 MG/5ML PO SUSR
500.0000 mg | Freq: Once | ORAL | Status: AC
  Administered 2016-06-10: 500 mg via ORAL
  Filled 2016-06-10: qty 10

## 2016-06-10 MED ORDER — BACITRACIN ZINC 500 UNIT/GM EX OINT
TOPICAL_OINTMENT | Freq: Once | CUTANEOUS | Status: AC
  Administered 2016-06-10: 1 via TOPICAL

## 2016-06-10 MED ORDER — CEPHALEXIN 250 MG/5ML PO SUSR: 500.0000 mg | Freq: Three times a day (TID) | ORAL | 0 refills | Status: AC

## 2016-06-10 NOTE — ED Provider Notes (Signed)
[facility]-EMERGENCY DEPT Provider Note   CSN: [id number] Arrival date & time: 06/10/16  1747  History   Chief Complaint Chief Complaint  Patient presents with  . Wound Check    HPI  [name] is an 82 y.o. female with history of Alzehimer's, HTN, schizophrena, PAD, CAD, who presents to the ED for evaluation of a right ankle wound. She is accompanied by her daughter who provides the history and is pt's HCPOA. Pt's daughter states she noticed a wound on pt's lateral right ankle this morning. They live together and neither pt's daughter nor part time care taker noticed the wound before this morning. Pt can walk with assistance and is supposed to walk three times daily. Pt apparently has been walking on it with no complaints. [name] is reportedly at her baseline mental status. She has not been more confused or lethargic than usual. Pt has had no witnessed falls. Pt's daughter thinks that pt might have sustained the wound from rubbing her ankle on the bed while sleeping, which she is known to do.  Past Medical History:  Diagnosis Date  . Alzheimer disease   . Chronic back pain   . Clostridium difficile colitis   . Hiatal hernia   . Hypertension   . Reflux   . Schizophrenia (HCC)   . Vertebral compression fracture [facility](HCC)     Patient Active Problem List   Diagnosis Date Noted  . Hyperglycemia 03/23/2016  . Dysphagia 03/23/2016  . Fall from wheelchair 03/23/2016  . Hip fracture (HCC) 03/22/2016  . Failure to thrive in adult 02/10/2015  . Dehydration 02/10/2015  . Chronic back pain   . UTI (lower urinary tract infection) 06/09/2014  . Altered mental status 06/09/2014  . Hypertension 06/09/2014  . Acute encephalopathy 06/09/2014  . Volume overload 12/25/2013  . Hyponatremia 12/18/2013  . UTI (urinary tract infection) 12/18/2013  . Viral syndrome 12/18/2013  . Diarrhea due to drug 12/18/2013  . Volume depletion 12/18/2013  . BRBPR (bright red blood per rectum) 10/03/2012  .  Dementia 10/03/2012  . Schizophrenia (HCC) 10/03/2012    Past Surgical History:  Procedure Laterality Date  . CAROTID ENDARTERECTOMY    . CAROTID STENT    . HIP ARTHROPLASTY Right 03/24/2016   Procedure: ARTHROPLASTY BIPOLAR HIP (HEMIARTHROPLASTY);  Surgeon: Durene RomansMatthew Olin, MD;  [facility];  Service: Orthopedics;  Laterality: Right;  . LUNG REMOVAL, PARTIAL    . tumor of lung -benign , and removed.      OB History    No data available       Home Medications    Prior to Admission medications   Medication Sig Start Date End Date Taking? Authorizing Provider  acetaminophen (TYLENOL) 500 MG tablet Take 2 tablets (1,000 mg total) by mouth every 8 (eight) hours as needed for mild pain (or Fever >/= 101). 03/27/16   Shanker Levora DredgeM Ghimire, MD  amLODipine (NORVASC) 1 mg/mL SUSP oral suspension Take 5 mLs (5 mg total) by mouth daily. 03/27/16   Shanker Levora DredgeM Ghimire, MD  aspirin 81 MG chewable tablet Chew 1 tablet (81 mg total) by mouth 2 (two) times daily. 03/27/16   Shanker Levora DredgeM Ghimire, MD  ferrous sulfate 300 (60 Fe) MG/5ML syrup Take 5 mLs (300 mg total) by mouth 2 (two) times daily with a meal. 03/27/16   Shanker Levora DredgeM Ghimire, MD  ketoconazole (NIZORAL) 2 % cream Apply 1 application topically daily. Under breasts for yeast 06/06/14   Historical Provider, MD  Multiple Vitamin (MULTIVITAMIN)  capsule Take 1 capsule by mouth daily. Put in applesauce or shakes    Historical Provider, MD  pantoprazole sodium (PROTONIX) 40 mg/20 mL PACK Take 20 mLs (40 mg total) by mouth daily. 03/27/16   Shanker Levora Dredge, MD  polyethylene glycol (MIRALAX / Ethelene Hal) packet Take 17 g by mouth daily as needed for mild constipation. 03/27/16   Shanker Levora Dredge, MD  traMADol (ULTRAM) 5 mg/mL SUSP Take 2.5-4 mLs (12.5-20 mg total) by mouth every 6 (six) hours as needed for moderate pain. 03/27/16   Shanker Levora Dredge, MD    Family History Family History  Problem Relation Age of Onset  . Family history unknown: Yes    Social  History Social History  Substance Use Topics  . Smoking status: Never Smoker  . Smokeless tobacco: Not on file  . Alcohol use No     Allergies   Codeine   Review of Systems Review of Systems  All other systems reviewed and are negative.    Physical Exam Updated Vital Signs BP 113/68 (BP Location: Right Arm)   Pulse 81   Temp 98.4 F (36.9 C) (Oral)   Resp 16   SpO2 97%   Physical Exam  Constitutional: No distress.  Laying in bed NAD  HENT:  Head: Atraumatic.  Right Ear: External ear normal.  Left Ear: External ear normal.  Nose: Nose normal.  Eyes: Conjunctivae are normal. No scleral icterus.  Cardiovascular: Normal rate and regular rhythm.   Pulmonary/Chest: Effort normal. No respiratory distress.  Abdominal: She exhibits no distension.  Neurological: She is alert.  Skin: Skin is warm. She is not diaphoretic.  Right lateral ankle with erythema and skin avulsion wound. There is some surrounding edema. Ankle is tender to palpation.   Psychiatric: She has a normal mood and affect. Her behavior is normal.  Nursing note and vitals reviewed.    ED Treatments / Results  Labs (all labs ordered are listed, but only abnormal results are displayed) Labs Reviewed - No data to display  EKG  EKG Interpretation None       Radiology Dg Ankle Complete Right  Result Date: 06/10/2016 CLINICAL DATA:  Wound to right ankle from bed sore EXAM: RIGHT ANKLE - COMPLETE 3+ VIEW COMPARISON:  None. FINDINGS: There is no evidence of fracture, dislocation, or joint effusion. Small plantar and posterior calcaneal heel spurs. There is no evidence of arthropathy or other focal bone abnormality. Soft tissues are unremarkable. IMPRESSION: 1. No acute bone abnormality. 2. Heel spurs. Electronically Signed   By: Signa Kell M.D.   On: 06/10/2016 20:15    Procedures Procedures (including critical care time)  Medications Ordered in ED Medications - No data to display   Initial  Impression / Assessment and Plan / ED Course  I have reviewed the triage vital signs and the nursing notes.  Pertinent labs & imaging results that were available during my care of the patient were reviewed by me and considered in my medical decision making (see chart for details).  Clinical Course    X-ray negative. Pt afebrile and in NAD. At baseline mentation. Appears to have pressure wound to right lateral ankle. No evidence of bony injury or osteo on x-ray. Pt seen by attending Dr. Ranae Palms as well. We will cover with course of oral antibiotics. Pt can only tolerate liquids so will cover with course of keflex solution with first dose here. Bacitracin topically. Per pt daughter request home health ordered for wound checks. Encouraged f/u  with PCP. ER return precautions given.  Final Clinical Impressions(s) / ED Diagnoses   Final diagnoses:  Ankle wound, right, initial encounter  Cellulitis of right ankle    New Prescriptions Discharge Medication List as of 06/10/2016  9:27 PM    START taking these medications   Details  bacitracin ointment Apply 1 application topically 2 (two) times daily., Starting Mon 06/10/2016, Print    cephALEXin (KEFLEX) 250 MG/5ML suspension Take 10 mLs (500 mg total) by mouth 3 (three) times daily., Starting Mon 06/10/2016, Until Mon 06/17/2016, Print         Carlene Coria, PA-C 06/11/16 1610    Loren Racer, MD 06/12/16 [phone]

## 2016-06-10 NOTE — Discharge Instructions (Signed)
Take antibiotics as prescribed. Keep area covered with ointment and bandage and change twice a day. I have ordered home health to help monitor Ms. [name]' wound healing. Follow up with primary care as soon as possible. Return to the ER for new or worsening symptoms.

## 2016-06-10 NOTE — ED Triage Notes (Signed)
Pt here with wound to right ankle from bed sore; pt here to have evaluated

## 2016-06-10 NOTE — Care Management Note (Signed)
Case Management Note  Patient Details  Name: [name] MRN: [id number] Date of Birth: 09/23/1923  [facility]ive/Objective[facility]  [facility].  Patient presents to ED with wound                Action/Plan: Discussed hh services with patient's daughter [name].  Patient lives at home with her daughter.  [name] reports patient has a walker, W/C, cane, bedside commode and shower bench.  No further dme needs at this time per daughter.  [name] reports patient has used [facility] and would like to use this agency again for services. She also reports she has hired someone privately to assist patient with ADL's.  [name] reports she has called [facility] at reports they will have a nurse available on Wed.  She is concerned about the patient's wound on Tuesday.  She also would like to know if she should be ambulating the patient three times a day.  EDCM suggested the EDRN show patient's daughter home to change wound dressing properly and give supplies for home.  Patient's daughter agreeable to this plan.  EDCM discussed patient with Casimer BilisSerena PA who has placed orders fo hh RN and aide.  EDCM informed Casimer BilisSerena PA of daughter's concerns.  [facility] with confirmation of receipt.  No further EDCM needs at this time.   Expected Discharge Date:                  Expected Discharge Plan:  Home w Home Health Services  In-House Referral:     Discharge planning Services  CM Consult  Post Acute Care Choice:  Home Health Choice offered to:  Adult Children  DME Arranged:   (none needed per daughter) DME Agency:     HH Arranged:  RN, Nurse's Aide [facility]  Status of Service:  Completed, signed off  If discussed at [employer] Length of Stay Meetings, dates discussed:    Additional CommentsRadford Pax:  Phaedra Colgate, RN 06/10/2016, 8:39 PM

## 2016-06-11 NOTE — Progress Notes (Signed)
06/11/2016 A. Kristelle Cavallaro RNCM 1551pm  EDCM called patient's daughter Clayborne Danaatti for follow up.  Clayborne Danaatti reports patient, "Is good."  She believes the antibiotic is working well as the wound looks much better. She reprots she has a caregiver with the patient currently.  She reports she hasn't heard from [facility] but does have their contact information from previous services if needed.  Patti thankful for follow up phone call.  No further EDCM needs at this time.

## 2016-10-09 IMAGING — CR DG CHEST 1V
1 series · 1 of 1 positions shown · non-contrast
Comparison: 06/09/2014

CLINICAL DATA: Fall, right leg pain

EXAM:
CHEST 1 VIEW

[chest ap]
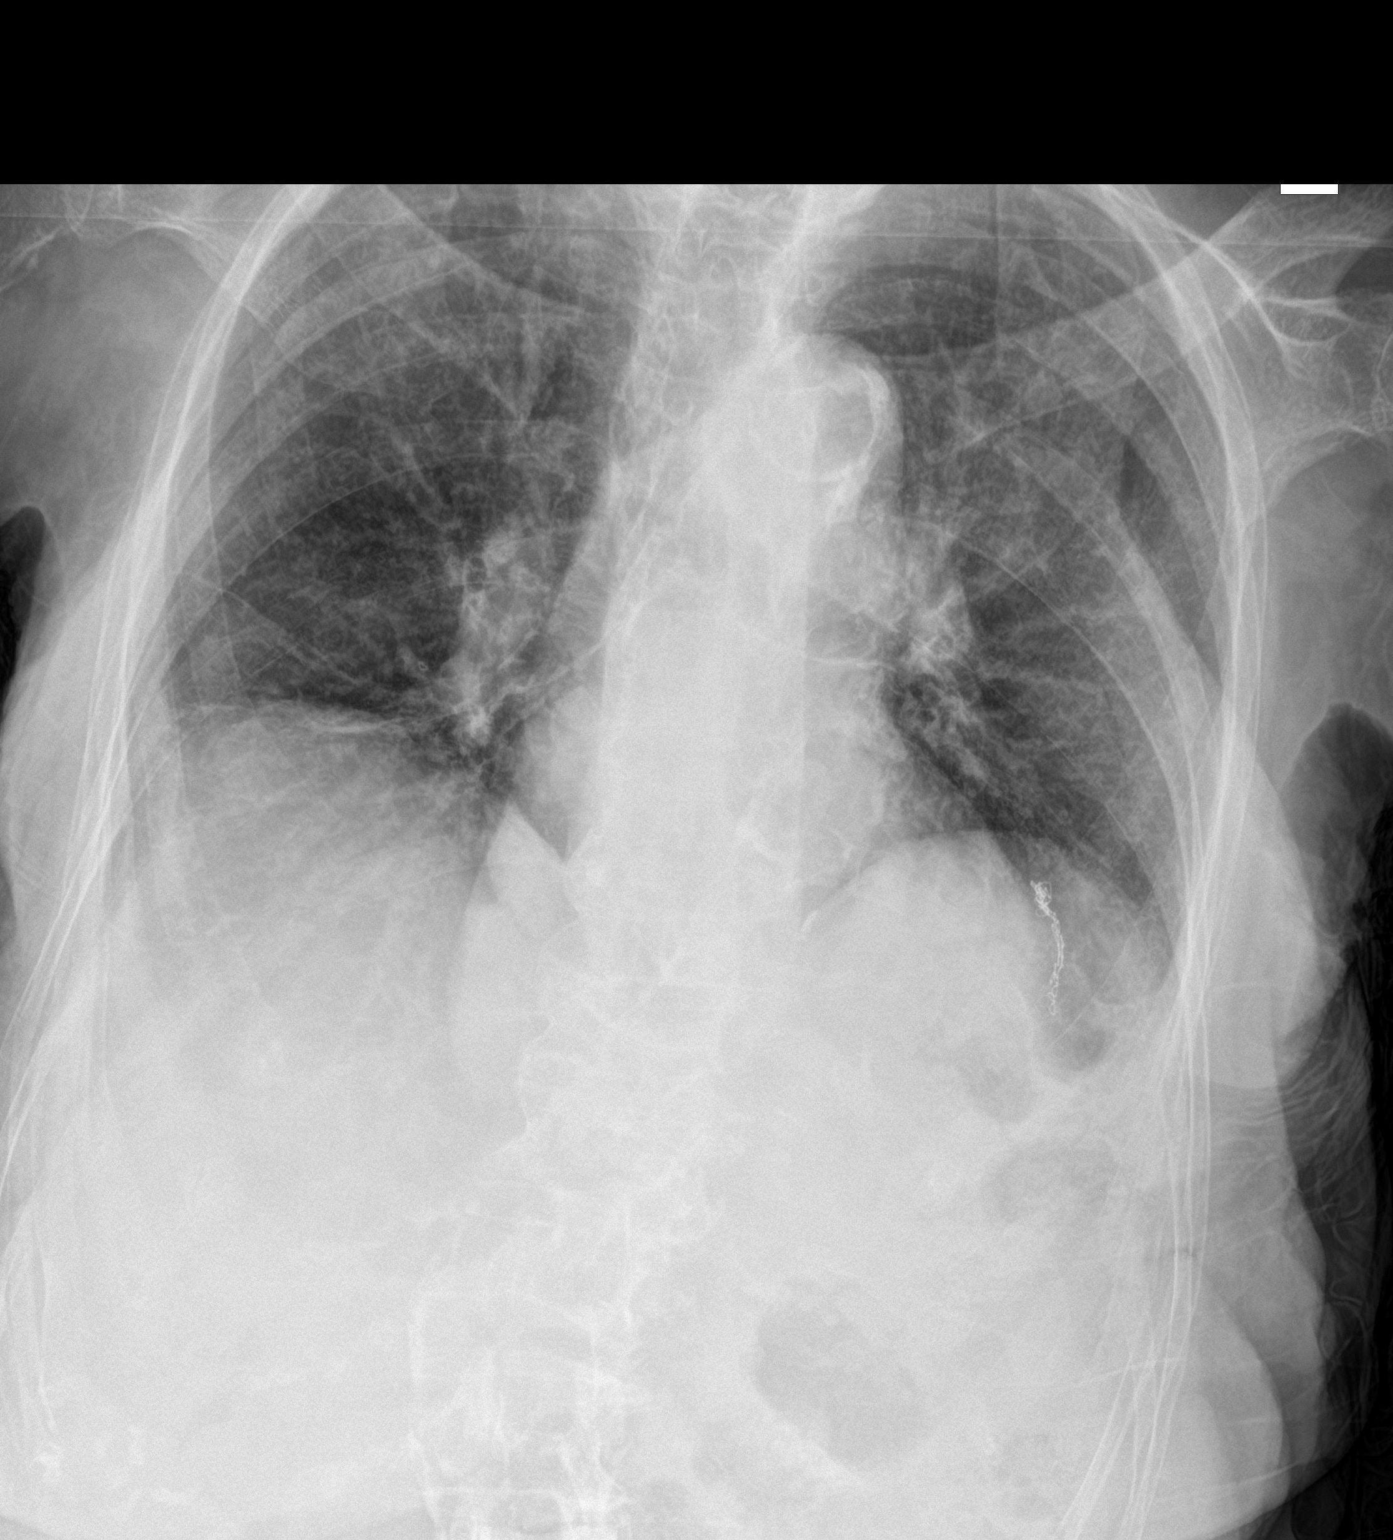

[1 of 1 positions shown; findings below may reference images not displayed]

FINDINGS: Right basilar scarring/ atelectasis. No pleural effusion or
pneumothorax.

Cardiomegaly.

Degenerative changes the visualized thoracolumbar spine.
IMPRESSION: No evidence of acute cardiopulmonary disease.

## 2017-03-15 ENCOUNTER — Emergency Department (HOSPITAL_COMMUNITY)
Admission: EM | Admit: 2017-03-15 | Discharge: 2017-03-15 | Disposition: A | Discharge status: Home / Self Care | Payer: Medicare Other | Attending: Emergency Medicine | Admitting: Emergency Medicine

## 2017-03-15 ENCOUNTER — Encounter (HOSPITAL_COMMUNITY): Payer: Self-pay | Admitting: Emergency Medicine

## 2017-03-15 DIAGNOSIS — Z96641 Presence of right artificial hip joint: Secondary | ICD-10-CM | POA: Insufficient documentation

## 2017-03-15 DIAGNOSIS — R4182 Altered mental status, unspecified: Secondary | ICD-10-CM | POA: Diagnosis not present

## 2017-03-15 DIAGNOSIS — Z7401 Bed confinement status: Secondary | ICD-10-CM | POA: Diagnosis not present

## 2017-03-15 DIAGNOSIS — L89302 Pressure ulcer of unspecified buttock, stage 2: Secondary | ICD-10-CM | POA: Diagnosis not present

## 2017-03-15 DIAGNOSIS — R402411 Glasgow coma scale score 13-15, in the field [EMT or ambulance]: Secondary | ICD-10-CM | POA: Diagnosis not present

## 2017-03-15 DIAGNOSIS — L03317 Cellulitis of buttock: Secondary | ICD-10-CM | POA: Diagnosis not present

## 2017-03-15 DIAGNOSIS — I10 Essential (primary) hypertension: Secondary | ICD-10-CM | POA: Diagnosis not present

## 2017-03-15 DIAGNOSIS — L89322 Pressure ulcer of left buttock, stage 2: Secondary | ICD-10-CM | POA: Diagnosis not present

## 2017-03-15 DIAGNOSIS — R279 Unspecified lack of coordination: Secondary | ICD-10-CM | POA: Diagnosis not present

## 2017-03-15 DIAGNOSIS — F4489 Other dissociative and conversion disorders: Secondary | ICD-10-CM | POA: Diagnosis not present

## 2017-03-15 NOTE — ED Provider Notes (Signed)
[facility]-EMERGENCY DEPT Provider Note   CSN: [id number] Arrival date & time: 03/15/17  1335     History   Chief Complaint Chief Complaint  Patient presents with  . Wound Check    HPI [name] is a 83 y.o. female.    Patient was brought to the emergency department for THE BACKSIDE.   The history is provided by the EMS personnel. No language interpreter was used.  Wound Check  This is a new problem. The current episode started more than 2 days ago. The problem occurs constantly. The problem has not changed since onset.Pertinent negatives include no chest pain. Nothing aggravates the symptoms. Nothing relieves the symptoms. She has tried nothing for the symptoms.    Past Medical History:  Diagnosis Date  . Alzheimer disease   . Chronic back pain   . Clostridium difficile colitis   . Hiatal hernia   . Hypertension   . Reflux   . Schizophrenia (HCC)   . Vertebral compression fracture [facility](HCC)     Patient Active Problem List   Diagnosis Date Noted  . Hyperglycemia 03/23/2016  . Dysphagia 03/23/2016  . Fall from wheelchair 03/23/2016  . Hip fracture (HCC) 03/22/2016  . Failure to thrive in adult 02/10/2015  . Dehydration 02/10/2015  . Chronic back pain   . UTI (lower urinary tract infection) 06/09/2014  . Altered mental status 06/09/2014  . Hypertension 06/09/2014  . Acute encephalopathy 06/09/2014  . Volume overload 12/25/2013  . Hyponatremia 12/18/2013  . UTI (urinary tract infection) 12/18/2013  . Viral syndrome 12/18/2013  . Diarrhea due to drug 12/18/2013  . Volume depletion 12/18/2013  . BRBPR (bright red blood per rectum) 10/03/2012  . Dementia 10/03/2012  . Schizophrenia (HCC) 10/03/2012    Past Surgical History:  Procedure Laterality Date  . CAROTID ENDARTERECTOMY    . CAROTID STENT    . HIP ARTHROPLASTY Right 03/24/2016   Procedure: ARTHROPLASTY BIPOLAR HIP (HEMIARTHROPLASTY);  Surgeon: Durene RomansMatthew Olin, MD;  [facility];  Service: Orthopedics;   Laterality: Right;  . LUNG REMOVAL, PARTIAL    . tumor of lung -benign , and removed.      OB History    No data available       Home Medications    Prior to Admission medications   Medication Sig Start Date End Date Taking? Authorizing Provider  Cholecalciferol (VITAMIN D3) 3000 units TABS Take 3,000 Units by mouth daily.   Yes [provider]  Coenzyme Q10 (COQ10 PO) Take 1 tablet by mouth daily.   Yes [provider]  Multiple Vitamin (MULTIVITAMIN) capsule Take 1 capsule by mouth daily. Put in applesauce or shakes   Yes [provider]  NON FORMULARY Take 1 capsule by mouth daily. Boswella for joint support, bone health   Yes [provider]    Family History Family History  Problem Relation Age of Onset  . Family history unknown: Yes    Social History Social History  Substance Use Topics  . Smoking status: Never Smoker  . Smokeless tobacco: Not on file  . Alcohol use No     Allergies   Lactose intolerance (gi) and Codeine   Review of Systems Review of Systems  Unable to perform ROS: Dementia  Cardiovascular: Negative for chest pain.     Physical Exam Updated Vital Signs BP (!) 168/54 (BP Location: Right Arm)   Pulse 68   Temp 97.7 F (36.5 C) (Oral)   Resp 16   Ht 5\' 4"  (  1.626 m)   Wt 49.9 kg (110 lb)   SpO2 94%   BMI 18.88 kg/m   Physical Exam  Constitutional: She appears well-developed.  HENT:  Head: Normocephalic.  Eyes: Conjunctivae are normal.  Neck: No tracheal deviation present.  Cardiovascular:  No murmur heard. Musculoskeletal: Normal range of motion.  Neurological:  Patient alert not oriented at all. Patient has severe dementia  Skin:  Patient has a 2 cm grade 2 decubitus on her sacrum no obvious infection     ED Treatments / Results  Labs (all labs ordered are listed, but only abnormal results are displayed) Labs Reviewed - No data to display  EKG  EKG Interpretation None        Radiology No results found.  Procedures Procedures (including critical care time)  Medications Ordered in ED Medications - No data to display   Initial Impression / Assessment and Plan / ED Course  I have reviewed the triage vital signs and the nursing notes.  Pertinent labs & imaging results that were available during my care of the patient were reviewed by me and considered in my medical decision making (see chart for details).     Small stage II ulcer on her sacrum. Superiorly bandaged appropriately notes she will follow-up with her doctor  Final Clinical Impressions(s) / ED Diagnoses   Final diagnoses:  Decubitus ulcer of buttock, stage 2, unspecified laterality    New Prescriptions New Prescriptions   No medications on file     Bethann Berkshire, MD 03/15/17 1512

## 2017-03-15 NOTE — ED Triage Notes (Signed)
Pt brought in by ems along with her daughter.  Daughter also being seen.  States pt has chronic wound on sacrum.  Stage 2 decub noted.

## 2017-03-15 NOTE — ED Notes (Signed)
Pt daughter would like to be notified when she is on the way via ambulance.  Phone numbers are [phone] and [phone].

## 2017-03-15 NOTE — Discharge Instructions (Signed)
Follow up with your md in 1-2 weeks. °

## 2017-03-21 DIAGNOSIS — R32 Unspecified urinary incontinence: Secondary | ICD-10-CM | POA: Diagnosis not present

## 2017-03-21 DIAGNOSIS — R682 Dry mouth, unspecified: Secondary | ICD-10-CM | POA: Diagnosis not present

## 2017-03-21 DIAGNOSIS — E46 Unspecified protein-calorie malnutrition: Secondary | ICD-10-CM | POA: Diagnosis not present

## 2017-03-21 DIAGNOSIS — R64 Cachexia: Secondary | ICD-10-CM | POA: Diagnosis not present

## 2017-03-21 DIAGNOSIS — R634 Abnormal weight loss: Secondary | ICD-10-CM | POA: Diagnosis not present

## 2017-03-21 DIAGNOSIS — R531 Weakness: Secondary | ICD-10-CM | POA: Diagnosis not present

## 2017-03-21 DIAGNOSIS — G309 Alzheimer's disease, unspecified: Secondary | ICD-10-CM | POA: Diagnosis not present

## 2017-03-22 DIAGNOSIS — G309 Alzheimer's disease, unspecified: Secondary | ICD-10-CM | POA: Diagnosis not present

## 2017-03-22 DIAGNOSIS — E46 Unspecified protein-calorie malnutrition: Secondary | ICD-10-CM | POA: Diagnosis not present

## 2017-03-22 DIAGNOSIS — R32 Unspecified urinary incontinence: Secondary | ICD-10-CM | POA: Diagnosis not present

## 2017-03-22 DIAGNOSIS — Z743 Need for continuous supervision: Secondary | ICD-10-CM | POA: Diagnosis not present

## 2017-03-22 DIAGNOSIS — R634 Abnormal weight loss: Secondary | ICD-10-CM | POA: Diagnosis not present

## 2017-03-22 DIAGNOSIS — R64 Cachexia: Secondary | ICD-10-CM | POA: Diagnosis not present

## 2017-03-22 DIAGNOSIS — R279 Unspecified lack of coordination: Secondary | ICD-10-CM | POA: Diagnosis not present

## 2017-03-22 DIAGNOSIS — R531 Weakness: Secondary | ICD-10-CM | POA: Diagnosis not present

## 2017-03-23 DIAGNOSIS — R32 Unspecified urinary incontinence: Secondary | ICD-10-CM | POA: Diagnosis not present

## 2017-03-23 DIAGNOSIS — G309 Alzheimer's disease, unspecified: Secondary | ICD-10-CM | POA: Diagnosis not present

## 2017-03-23 DIAGNOSIS — R634 Abnormal weight loss: Secondary | ICD-10-CM | POA: Diagnosis not present

## 2017-03-23 DIAGNOSIS — R64 Cachexia: Secondary | ICD-10-CM | POA: Diagnosis not present

## 2017-03-23 DIAGNOSIS — E46 Unspecified protein-calorie malnutrition: Secondary | ICD-10-CM | POA: Diagnosis not present

## 2017-03-23 DIAGNOSIS — R531 Weakness: Secondary | ICD-10-CM | POA: Diagnosis not present

## 2017-03-24 DIAGNOSIS — R531 Weakness: Secondary | ICD-10-CM | POA: Diagnosis not present

## 2017-03-24 DIAGNOSIS — E46 Unspecified protein-calorie malnutrition: Secondary | ICD-10-CM | POA: Diagnosis not present

## 2017-03-24 DIAGNOSIS — R64 Cachexia: Secondary | ICD-10-CM | POA: Diagnosis not present

## 2017-03-24 DIAGNOSIS — R32 Unspecified urinary incontinence: Secondary | ICD-10-CM | POA: Diagnosis not present

## 2017-03-24 DIAGNOSIS — G309 Alzheimer's disease, unspecified: Secondary | ICD-10-CM | POA: Diagnosis not present

## 2017-03-24 DIAGNOSIS — R634 Abnormal weight loss: Secondary | ICD-10-CM | POA: Diagnosis not present

## 2017-03-25 DIAGNOSIS — R64 Cachexia: Secondary | ICD-10-CM | POA: Diagnosis not present

## 2017-03-25 DIAGNOSIS — E46 Unspecified protein-calorie malnutrition: Secondary | ICD-10-CM | POA: Diagnosis not present

## 2017-03-25 DIAGNOSIS — R634 Abnormal weight loss: Secondary | ICD-10-CM | POA: Diagnosis not present

## 2017-03-25 DIAGNOSIS — G309 Alzheimer's disease, unspecified: Secondary | ICD-10-CM | POA: Diagnosis not present

## 2017-03-25 DIAGNOSIS — R531 Weakness: Secondary | ICD-10-CM | POA: Diagnosis not present

## 2017-03-25 DIAGNOSIS — R32 Unspecified urinary incontinence: Secondary | ICD-10-CM | POA: Diagnosis not present

## 2017-03-26 DIAGNOSIS — R64 Cachexia: Secondary | ICD-10-CM | POA: Diagnosis not present

## 2017-03-26 DIAGNOSIS — R634 Abnormal weight loss: Secondary | ICD-10-CM | POA: Diagnosis not present

## 2017-03-26 DIAGNOSIS — R531 Weakness: Secondary | ICD-10-CM | POA: Diagnosis not present

## 2017-03-26 DIAGNOSIS — R32 Unspecified urinary incontinence: Secondary | ICD-10-CM | POA: Diagnosis not present

## 2017-03-26 DIAGNOSIS — E46 Unspecified protein-calorie malnutrition: Secondary | ICD-10-CM | POA: Diagnosis not present

## 2017-03-26 DIAGNOSIS — G309 Alzheimer's disease, unspecified: Secondary | ICD-10-CM | POA: Diagnosis not present

## 2017-03-27 DIAGNOSIS — R32 Unspecified urinary incontinence: Secondary | ICD-10-CM | POA: Diagnosis not present

## 2017-03-27 DIAGNOSIS — R634 Abnormal weight loss: Secondary | ICD-10-CM | POA: Diagnosis not present

## 2017-03-27 DIAGNOSIS — R531 Weakness: Secondary | ICD-10-CM | POA: Diagnosis not present

## 2017-03-27 DIAGNOSIS — R64 Cachexia: Secondary | ICD-10-CM | POA: Diagnosis not present

## 2017-03-27 DIAGNOSIS — G309 Alzheimer's disease, unspecified: Secondary | ICD-10-CM | POA: Diagnosis not present

## 2017-03-27 DIAGNOSIS — E46 Unspecified protein-calorie malnutrition: Secondary | ICD-10-CM | POA: Diagnosis not present

## 2017-03-28 DIAGNOSIS — R634 Abnormal weight loss: Secondary | ICD-10-CM | POA: Diagnosis not present

## 2017-03-28 DIAGNOSIS — R32 Unspecified urinary incontinence: Secondary | ICD-10-CM | POA: Diagnosis not present

## 2017-03-28 DIAGNOSIS — R531 Weakness: Secondary | ICD-10-CM | POA: Diagnosis not present

## 2017-03-28 DIAGNOSIS — G309 Alzheimer's disease, unspecified: Secondary | ICD-10-CM | POA: Diagnosis not present

## 2017-03-28 DIAGNOSIS — E46 Unspecified protein-calorie malnutrition: Secondary | ICD-10-CM | POA: Diagnosis not present

## 2017-03-28 DIAGNOSIS — R64 Cachexia: Secondary | ICD-10-CM | POA: Diagnosis not present

## 2017-03-29 DIAGNOSIS — E46 Unspecified protein-calorie malnutrition: Secondary | ICD-10-CM | POA: Diagnosis not present

## 2017-03-29 DIAGNOSIS — R634 Abnormal weight loss: Secondary | ICD-10-CM | POA: Diagnosis not present

## 2017-03-29 DIAGNOSIS — R64 Cachexia: Secondary | ICD-10-CM | POA: Diagnosis not present

## 2017-03-29 DIAGNOSIS — R279 Unspecified lack of coordination: Secondary | ICD-10-CM | POA: Diagnosis not present

## 2017-03-29 DIAGNOSIS — G309 Alzheimer's disease, unspecified: Secondary | ICD-10-CM | POA: Diagnosis not present

## 2017-03-29 DIAGNOSIS — R32 Unspecified urinary incontinence: Secondary | ICD-10-CM | POA: Diagnosis not present

## 2017-03-29 DIAGNOSIS — R531 Weakness: Secondary | ICD-10-CM | POA: Diagnosis not present

## 2017-03-29 DIAGNOSIS — Z7401 Bed confinement status: Secondary | ICD-10-CM | POA: Diagnosis not present

## 2017-08-14 DIAGNOSIS — F981 Encopresis not due to a substance or known physiological condition: Secondary | ICD-10-CM | POA: Diagnosis not present

## 2017-08-14 DIAGNOSIS — K644 Residual hemorrhoidal skin tags: Secondary | ICD-10-CM | POA: Diagnosis not present

## 2017-08-14 DIAGNOSIS — R627 Adult failure to thrive: Secondary | ICD-10-CM | POA: Diagnosis not present

## 2017-08-14 DIAGNOSIS — I739 Peripheral vascular disease, unspecified: Secondary | ICD-10-CM | POA: Diagnosis not present

## 2017-08-14 DIAGNOSIS — L89000 Pressure ulcer of unspecified elbow, unstageable: Secondary | ICD-10-CM | POA: Diagnosis not present

## 2017-08-14 DIAGNOSIS — I10 Essential (primary) hypertension: Secondary | ICD-10-CM | POA: Diagnosis not present

## 2017-08-14 DIAGNOSIS — R1310 Dysphagia, unspecified: Secondary | ICD-10-CM | POA: Diagnosis not present

## 2017-08-14 DIAGNOSIS — L89159 Pressure ulcer of sacral region, unspecified stage: Secondary | ICD-10-CM | POA: Diagnosis not present

## 2017-08-17 DIAGNOSIS — L89152 Pressure ulcer of sacral region, stage 2: Secondary | ICD-10-CM | POA: Diagnosis not present

## 2017-08-17 DIAGNOSIS — G309 Alzheimer's disease, unspecified: Secondary | ICD-10-CM | POA: Diagnosis not present

## 2017-09-23 ENCOUNTER — Encounter (INDEPENDENT_AMBULATORY_CARE_PROVIDER_SITE_OTHER): Payer: Self-pay | Admitting: Internal Medicine

## 2017-09-29 ENCOUNTER — Ambulatory Visit (INDEPENDENT_AMBULATORY_CARE_PROVIDER_SITE_OTHER): Payer: Medicare Other | Admitting: Internal Medicine

## 2017-10-02 ENCOUNTER — Encounter (INDEPENDENT_AMBULATORY_CARE_PROVIDER_SITE_OTHER): Payer: Self-pay

## 2017-10-02 ENCOUNTER — Ambulatory Visit (INDEPENDENT_AMBULATORY_CARE_PROVIDER_SITE_OTHER): Payer: Medicare Other | Admitting: Internal Medicine

## 2017-10-02 DIAGNOSIS — F0390 Unspecified dementia without behavioral disturbance: Secondary | ICD-10-CM

## 2017-10-02 DIAGNOSIS — R634 Abnormal weight loss: Secondary | ICD-10-CM | POA: Diagnosis not present

## 2017-10-02 DIAGNOSIS — R1312 Dysphagia, oropharyngeal phase: Secondary | ICD-10-CM | POA: Diagnosis not present

## 2017-10-02 DIAGNOSIS — F03C0 Unspecified dementia, severe, without behavioral disturbance, psychotic disturbance, mood disturbance, and anxiety: Secondary | ICD-10-CM

## 2017-10-02 NOTE — Progress Notes (Signed)
Reason for consultation.;  PEG placement.  History of present illness:  Consultation requested by Dr. Jenny ReichmannKashana Blake, MD of doctors making house calls.  Patient's daughter Ms. [name] is here to provide details of her history.  She requested not to bring her mother because she requires services of EMS and it is very costly.  Patient is a 84 year old Caucasian female who has advanced dementia with oropharyngeal dysphagia and gradual weight loss. Patient's daughter [name] has been care for her mother at home for the last 8 years.  Patient has 2 other daughters but they live out of town. Because of advanced dementia patient has not been able to swallow food.  Her daughter states it takes several minutes to hours to try to get few calories into her.  At times she has to force her mother putting food in her mouth and making her swallow.  Patient's daughter has decided on PEG placement because she feels it would help in her management at home.  She is fully aware that her mother's prognosis is very poor but she is bothered by the fact that she is losing weight and unable to eat. Patient has a history of dementia and schizophrenia and used to be very combative but not anymore. She is not taking any prescription medications at the present time. PEG placement was considered by interventional radiology in 2016.  She had a CAT scan which revealed a moderate sized sliding hiatal hernia and it was felt her stomach was high riding and therefore PEG placement was not attempted. Patient's activity is limited to bed and recliner. Her daughter states that she has sacral decub and it is almost healed.    Current Medications: Outpatient Encounter Medications as of 10/02/2017  Medication Sig  . Cholecalciferol (VITAMIN D3) 3000 units TABS Take 3,000 Units by mouth daily.  . Coenzyme Q10 (COQ10 PO) Take 1 tablet by mouth daily.  . Multiple Vitamin (MULTIVITAMIN) capsule Take 1 capsule by mouth daily. Put  in applesauce or shakes  . NON FORMULARY Take 1 capsule by mouth daily. Boswella for joint support, bone health   No facility-administered encounter medications on file as of 10/02/2017.    Past Medical History:  Diagnosis Date  . Alzheimer disease   .  Sacral decubitus ulcer.   .    . Hiatal hernia   . Hypertension   . Severe osteoporosis   . Schizophrenia (HCC)   . Vertebral compression fracture [facility](HCC)    Past Surgical History:  Procedure Laterality Date  . CAROTID ENDARTERECTOMY    . CAROTID STENT    . HIP ARTHROPLASTY Right 03/24/2016   Procedure: ARTHROPLASTY BIPOLAR HIP (HEMIARTHROPLASTY);  Surgeon: Durene RomansMatthew Olin, MD;  [facility];  Service: Orthopedics;  Laterality: Right;  . LUNG REMOVAL, PARTIAL    . tumor of lung -benign , and removed.        Objective:  Patient was not brought to the office for examination as she requires ambulance for such trips. Patient's examination was limited to short video that her daughter [name] had made for this visit. Patient is an elderly Caucasian female who is constantly chewing. Her daughter is trying to help her swallow milk in [employer] bottle.  Only after great difficulty she was able to make her take a few sips. She did did not appear to to be coughing.   Labs/studies Results:  Abdominopelvic CT films from 03/15/2016 reviewed.  She has moderate sliding hiatal hernia but most of her stomach appears to be  below the rib cage. She has severe osteoporosis.  Assessment:  Elderly Caucasian female with oropharyngeal dysphagia secondary to advanced dementia associated with weight loss.  Her daughter Ms. [name] who has been caring for her at home for the last 8 years have decided to proceed with PEG placement.  Procedure risks reviewed with her and she would like to proceed with a PEG placement as soon as feasible.   Plan:  Will check with social service and arrange for dietary consultation before patient scheduled for PEG  placement. Dietary consultation can be performed at the time of procedure. I just would like to make sure that all the arrangements have been made by the time is placed.

## 2017-10-02 NOTE — Patient Instructions (Signed)
Will schedule PEG placement after talking with social service and dietitian.

## 2017-10-05 ENCOUNTER — Encounter (HOSPITAL_COMMUNITY): Payer: Self-pay | Admitting: Emergency Medicine

## 2017-10-05 ENCOUNTER — Emergency Department (HOSPITAL_COMMUNITY): Payer: Medicare Other

## 2017-10-05 ENCOUNTER — Inpatient Hospital Stay (HOSPITAL_COMMUNITY)
Admission: EM | Admit: 2017-10-05 | Discharge: 2017-10-07 | DRG: 641 | Disposition: A | Discharge status: Home / Self Care | Payer: Medicare Other | Attending: Internal Medicine | Admitting: Internal Medicine

## 2017-10-05 DIAGNOSIS — R627 Adult failure to thrive: Principal | ICD-10-CM | POA: Diagnosis present

## 2017-10-05 DIAGNOSIS — I10 Essential (primary) hypertension: Secondary | ICD-10-CM | POA: Diagnosis not present

## 2017-10-05 DIAGNOSIS — R4689 Other symptoms and signs involving appearance and behavior: Secondary | ICD-10-CM | POA: Diagnosis not present

## 2017-10-05 DIAGNOSIS — G8929 Other chronic pain: Secondary | ICD-10-CM | POA: Diagnosis not present

## 2017-10-05 DIAGNOSIS — F0280 Dementia in other diseases classified elsewhere without behavioral disturbance: Secondary | ICD-10-CM | POA: Diagnosis not present

## 2017-10-05 DIAGNOSIS — M549 Dorsalgia, unspecified: Secondary | ICD-10-CM | POA: Diagnosis not present

## 2017-10-05 DIAGNOSIS — R1310 Dysphagia, unspecified: Secondary | ICD-10-CM

## 2017-10-05 DIAGNOSIS — R001 Bradycardia, unspecified: Secondary | ICD-10-CM

## 2017-10-05 DIAGNOSIS — E739 Lactose intolerance, unspecified: Secondary | ICD-10-CM | POA: Diagnosis present

## 2017-10-05 DIAGNOSIS — F0390 Unspecified dementia without behavioral disturbance: Secondary | ICD-10-CM | POA: Diagnosis present

## 2017-10-05 DIAGNOSIS — R4189 Other symptoms and signs involving cognitive functions and awareness: Secondary | ICD-10-CM

## 2017-10-05 DIAGNOSIS — Z66 Do not resuscitate: Secondary | ICD-10-CM | POA: Diagnosis present

## 2017-10-05 DIAGNOSIS — Z515 Encounter for palliative care: Secondary | ICD-10-CM

## 2017-10-05 DIAGNOSIS — R4182 Altered mental status, unspecified: Secondary | ICD-10-CM | POA: Diagnosis not present

## 2017-10-05 DIAGNOSIS — G309 Alzheimer's disease, unspecified: Secondary | ICD-10-CM | POA: Diagnosis present

## 2017-10-05 DIAGNOSIS — G301 Alzheimer's disease with late onset: Secondary | ICD-10-CM

## 2017-10-05 DIAGNOSIS — Z79899 Other long term (current) drug therapy: Secondary | ICD-10-CM

## 2017-10-05 DIAGNOSIS — K219 Gastro-esophageal reflux disease without esophagitis: Secondary | ICD-10-CM | POA: Diagnosis present

## 2017-10-05 DIAGNOSIS — Z96641 Presence of right artificial hip joint: Secondary | ICD-10-CM | POA: Diagnosis present

## 2017-10-05 DIAGNOSIS — R6251 Failure to thrive (child): Secondary | ICD-10-CM | POA: Diagnosis present

## 2017-10-05 DIAGNOSIS — Z7401 Bed confinement status: Secondary | ICD-10-CM

## 2017-10-05 DIAGNOSIS — F209 Schizophrenia, unspecified: Secondary | ICD-10-CM | POA: Diagnosis present

## 2017-10-05 DIAGNOSIS — R748 Abnormal levels of other serum enzymes: Secondary | ICD-10-CM | POA: Diagnosis present

## 2017-10-05 LAB — I-STAT CHEM 8, ED
BUN: 33 mg/dL — AB (ref 6–20)
CHLORIDE: 104 mmol/L (ref 101–111)
CREATININE: 0.7 mg/dL (ref 0.44–1.00)
Calcium, Ion: 1.26 mmol/L (ref 1.15–1.40)
GLUCOSE: 102 mg/dL — AB (ref 65–99)
HCT: 43 % (ref 36.0–46.0)
Hemoglobin: 14.6 g/dL (ref 12.0–15.0)
POTASSIUM: 3.6 mmol/L (ref 3.5–5.1)
Sodium: 143 mmol/L (ref 135–145)
TCO2: 29 mmol/L (ref 22–32)

## 2017-10-05 LAB — APTT: APTT: 24 s (ref 24–36)

## 2017-10-05 LAB — CBC
HEMATOCRIT: 44.4 % (ref 36.0–46.0)
Hemoglobin: 14.4 g/dL (ref 12.0–15.0)
MCH: 31.2 pg (ref 26.0–34.0)
MCHC: 32.4 g/dL (ref 30.0–36.0)
MCV: 96.3 fL (ref 78.0–100.0)
Platelets: 139 10*3/uL — ABNORMAL LOW (ref 150–400)
RBC: 4.61 MIL/uL (ref 3.87–5.11)
RDW: 12.9 % (ref 11.5–15.5)
WBC: 6.2 10*3/uL (ref 4.0–10.5)

## 2017-10-05 LAB — I-STAT CG4 LACTIC ACID, ED: Lactic Acid, Venous: 2.87 mmol/L (ref 0.5–1.9)

## 2017-10-05 LAB — DIFFERENTIAL
BASOS ABS: 0 10*3/uL (ref 0.0–0.1)
Basophils Relative: 0 %
Eosinophils Absolute: 0.2 10*3/uL (ref 0.0–0.7)
Eosinophils Relative: 3 %
LYMPHS ABS: 2 10*3/uL (ref 0.7–4.0)
Lymphocytes Relative: 33 %
MONOS PCT: 5 %
Monocytes Absolute: 0.3 10*3/uL (ref 0.1–1.0)
NEUTROS ABS: 3.6 10*3/uL (ref 1.7–7.7)
Neutrophils Relative %: 59 %

## 2017-10-05 LAB — I-STAT TROPONIN, ED: TROPONIN I, POC: 0 ng/mL (ref 0.00–0.08)

## 2017-10-05 LAB — COMPREHENSIVE METABOLIC PANEL
ALBUMIN: 3.5 g/dL (ref 3.5–5.0)
ALT: 11 U/L — AB (ref 14–54)
AST: 20 U/L (ref 15–41)
Alkaline Phosphatase: 66 U/L (ref 38–126)
Anion gap: 11 (ref 5–15)
BILIRUBIN TOTAL: 1.1 mg/dL (ref 0.3–1.2)
BUN: 34 mg/dL — AB (ref 6–20)
CHLORIDE: 104 mmol/L (ref 101–111)
CO2: 27 mmol/L (ref 22–32)
CREATININE: 0.68 mg/dL (ref 0.44–1.00)
Calcium: 9.7 mg/dL (ref 8.9–10.3)
GFR calc Af Amer: >60 mL/min (ref >60)
GLUCOSE: 106 mg/dL — AB (ref 65–99)
Potassium: 3.6 mmol/L (ref 3.5–5.1)
Sodium: 142 mmol/L (ref 135–145)
TOTAL PROTEIN: 6.4 g/dL — AB (ref 6.5–8.1)

## 2017-10-05 LAB — LACTIC ACID, PLASMA
LACTIC ACID, VENOUS: 2.8 mmol/L — AB (ref 0.5–1.9)
Lactic Acid, Venous: 1 mmol/L (ref 0.5–1.9)

## 2017-10-05 LAB — ETHANOL: Alcohol, Ethyl (B): <10 mg/dL (ref <10)

## 2017-10-05 LAB — PROTIME-INR
INR: 0.92
Prothrombin Time: 12.3 seconds (ref 11.4–15.2)

## 2017-10-05 LAB — TROPONIN I: Troponin I: 0.06 ng/mL (ref <0.03)

## 2017-10-05 MED ORDER — SODIUM CHLORIDE 0.9 % IV SOLN
INTRAVENOUS | Status: DC
  Administered 2017-10-05: 15:00:00 via INTRAVENOUS

## 2017-10-05 MED ORDER — SODIUM CHLORIDE 0.9 % IV BOLUS (SEPSIS)
500.0000 mL | Freq: Once | INTRAVENOUS | Status: AC
  Administered 2017-10-05: 500 mL via INTRAVENOUS

## 2017-10-05 MED ORDER — MORPHINE SULFATE (PF) 4 MG/ML IV SOLN
1.0000 mg | INTRAVENOUS | Status: DC | PRN
Start: 2017-10-05 — End: 2017-10-05

## 2017-10-05 MED ORDER — HYDRALAZINE HCL 20 MG/ML IJ SOLN
20.0000 mg | INTRAMUSCULAR | Status: DC | PRN
  Administered 2017-10-05: 20 mg via INTRAVENOUS
  Filled 2017-10-05: qty 1

## 2017-10-05 MED ORDER — MORPHINE SULFATE (PF) 4 MG/ML IV SOLN
0.5000 mg | INTRAVENOUS | Status: DC | PRN
  Administered 2017-10-06: 1 mg via INTRAVENOUS
  Administered 2017-10-06: 0.52 mg via INTRAVENOUS
  Administered 2017-10-06: 1 mg via INTRAVENOUS
  Administered 2017-10-06: 0.52 mg via INTRAVENOUS
  Administered 2017-10-07 (×2): 1 mg via INTRAVENOUS
  Filled 2017-10-05 (×5): qty 1

## 2017-10-05 NOTE — ED Notes (Signed)
Pt sat up slowly with daughter assistance. Pt's daughter requested to feed pt slowly with yogurt and pudding. Advised daughter to let this RN know if she needed anything. Will continue to monitor and will give morphine once daughter is ready.

## 2017-10-05 NOTE — ED Notes (Signed)
Family requesting labs be drawn from the pt's IV

## 2017-10-05 NOTE — ED Notes (Signed)
Daughter made aware that tray was ordered for pt. Daughter would like to wait until the pt has had something to eat before we give pt pain medication.  Daughter would also like to wait until the food arrives to set pt up in bed.

## 2017-10-05 NOTE — ED Notes (Signed)
Pt's HR 24-30 for approximately 2 minutes. Pt's daughter states, "Her eyes were rolled back in her head." Dr. Hyacinth MeekerMiller came to bedside. Pt's HR increased to 80. MD Onalee Huaavid made aware of situation.

## 2017-10-05 NOTE — ED Notes (Signed)
MD Onalee Huaavid aware pt fails her swallow screen and has OT eval ordered. Per MD "order pt a tray anyways per daughter request."

## 2017-10-05 NOTE — ED Provider Notes (Signed)
[facility] EMERGENCY DEPARTMENT Provider Note   CSN: [id number] Arrival date & time:        History   Chief Complaint No chief complaint on file.   HPI [name] is a 84 y.o. female.  The history is provided by the EMS personnel. No language interpreter was used.   [name] is a 84 y.o. female who presents to the Emergency Department complaining of AMS.  Of all 5 caveat due to unresponsiveness.  History is provided by EMS.  Per EMS she lives at home with caretakers and has advanced dementia.  EMS was called for unresponsiveness.  She was sitting when she began grasping at her neck and became unresponsive.  For EMS they noted that she was unresponsive with constricted pupils. Additional history available from the daughter after patient's initial evaluation.  Daughter states that the patient had a slight cough and then reached her hands up to her throat and clot at her throat.  Her mouth was open wide and looks like she was trying to screen but could not let out any noise.  This lasted about a minute.  There was some shaking activity during this episode.  After she relaxed and stopped reaching for her neck she was breathing heavily with her tongue protruding from her mouth with her eyes are gazing to the right and up. Past Medical History:  Diagnosis Date  . Alzheimer disease   . Chronic back pain   . Clostridium difficile colitis   . Hiatal hernia   . Hypertension   . Reflux   . Schizophrenia (HCC)   . Vertebral compression fracture [facility])     Patient Active Problem List   Diagnosis Date Noted  . Episode of abnormal behavior 10/05/2017  . Hyperglycemia 03/23/2016  . Dysphagia 03/23/2016  . Fall from wheelchair 03/23/2016  . Hip fracture (HCC) 03/22/2016  . Failure to thrive in adult 02/10/2015  . Dehydration 02/10/2015  . Chronic back pain   . UTI (lower urinary tract infection) 06/09/2014  . Altered mental status 06/09/2014  . Hypertension  06/09/2014  . Acute encephalopathy 06/09/2014  . Volume overload 12/25/2013  . Hyponatremia 12/18/2013  . UTI (urinary tract infection) 12/18/2013  . Viral syndrome 12/18/2013  . Diarrhea due to drug 12/18/2013  . Volume depletion 12/18/2013  . BRBPR (bright red blood per rectum) 10/03/2012  . Dementia 10/03/2012  . Schizophrenia (HCC) 10/03/2012    Past Surgical History:  Procedure Laterality Date  . CAROTID ENDARTERECTOMY    . CAROTID STENT    . HIP ARTHROPLASTY Right 03/24/2016   Procedure: ARTHROPLASTY BIPOLAR HIP (HEMIARTHROPLASTY);  Surgeon: Durene Romans, MD;  Location: [facility] OR;  Service: Orthopedics;  Laterality: Right;  . LUNG REMOVAL, PARTIAL    . tumor of lung -benign , and removed.      OB History    No data available       Home Medications    Prior to Admission medications   Medication Sig Start Date End Date Taking? Authorizing Provider  Cholecalciferol (VITAMIN D3) 5000 units CAPS Take 5,000 Units by mouth daily.    Yes [provider]  Coenzyme Q10 (COQ10 PO) Take 1 tablet by mouth daily.   Yes [provider]  Cranberry 1000 MG CAPS Take 1,000 capsules by mouth daily.   Yes [provider]  Multiple Vitamin (MULTIVITAMIN) capsule Take 1 capsule by mouth daily. Put in applesauce or shakes   Yes [provider]  NON  FORMULARY Take 1 capsule by mouth daily. Boswella for joint support, bone health   Yes [provider]  Wound Dressings (ALLANTOIN) gel Apply 1 application topically as needed for wound care.   Yes [provider]    Family History Family History  Family history unknown: Yes    Social History Social History   Tobacco Use  . Smoking status: Never Smoker  . Smokeless tobacco: Never Used  Substance Use Topics  . Alcohol use: No  . Drug use: No     Allergies   Lactose intolerance (gi) and Codeine   Review of Systems Review of Systems  Unable to perform ROS: Dementia     Physical  Exam Updated Vital Signs BP (!) 153/99   Pulse 98   Temp (!) 97.3 F (36.3 C) (Rectal)   Resp (!) 23   SpO2 97%   Physical Exam  Constitutional: She appears well-developed.  Frail  HENT:  Head: Normocephalic and atraumatic.  Neck:  Neck is hyperextended there is a skin tear to the lower anterior neck that is hemostatic  Cardiovascular: Normal rate and regular rhythm.  No murmur heard. Pulmonary/Chest: Effort normal and breath sounds normal. No respiratory distress.  Abdominal: Soft. There is no tenderness. There is no rebound and no guarding.  Musculoskeletal: She exhibits no edema or tenderness.  Neurological:  Eyes are closed but she actively resists eye opening bilaterally.  She does occasionally open the right eye.  She does not follow commands and has mumbling speech.  She does move bilateral upper extremities purposefully.  Skin: Skin is warm and dry. There is pallor.  Psychiatric:  Unable to assess  Nursing note and vitals reviewed.    ED Treatments / Results  Labs (all labs ordered are listed, but only abnormal results are displayed) Labs Reviewed  CBC - Abnormal; Notable for the following components:      Result Value   Platelets 139 (*)    All other components within normal limits  COMPREHENSIVE METABOLIC PANEL - Abnormal; Notable for the following components:   Glucose, Bld 106 (*)    BUN 34 (*)    Total Protein 6.4 (*)    ALT 11 (*)    All other components within normal limits  I-STAT CHEM 8, ED - Abnormal; Notable for the following components:   BUN 33 (*)    Glucose, Bld 102 (*)    All other components within normal limits  I-STAT CG4 LACTIC ACID, ED - Abnormal; Notable for the following components:   Lactic Acid, Venous 2.87 (*)    All other components within normal limits  ETHANOL  PROTIME-INR  APTT  DIFFERENTIAL  TROPONIN I  LACTIC ACID, PLASMA  LACTIC ACID, PLASMA  TROPONIN I  TROPONIN I  I-STAT TROPONIN, ED    EKG  EKG  Interpretation None       Radiology Ct Head Wo Contrast  Result Date: 10/05/2017 CLINICAL DATA:  Altered mental status with episode of unresponsiveness EXAM: CT HEAD WITHOUT CONTRAST TECHNIQUE: Contiguous axial images were obtained from the base of the skull through the vertex without intravenous contrast. COMPARISON:  June 09, 2014 FINDINGS: Brain: Motion artifact makes this study considerably less than optimal. There is moderate diffuse atrophy. There is no intracranial mass, hemorrhage, extra-axial fluid collection, or midline shift. There is extensive small vessel disease throughout the centra semiovale bilaterally. No obvious acute infarct is evident with the admitted limitations due to motion artifact. Vascular: No hyperdense vessel is demonstrable on  this study. There is calcification in each carotid siphon region. Skull: Bony calvarium appears intact with limitations due to patient motion. Sinuses/Orbits: There is opacification in the posterior sphenoid sinus region as well as in the posterior right maxillary antrum. There is opacification and thickening in multiple ethmoid air cells as well as in the right frontal sinus. Orbits appear symmetric bilaterally. Other: Mastoids appear grossly clear with limitations of motion. IMPRESSION: Study limited due to patient motion. Atrophy with supratentorial small vessel disease. No acute infarct evident with patient motion making assessment less than optimal. No hemorrhage or mass. Foci of arterial vascular calcification noted. Areas of paranasal sinus disease noted at several sites. Electronically Signed   By: Bretta BangWilliam  Woodruff III M.D.   On: 10/05/2017 11:47   Dg Chest Port 1 View  Result Date: 10/05/2017 CLINICAL DATA:  84 year old female with history of altered mental status. EXAM: PORTABLE CHEST 1 VIEW COMPARISON:  Chest x-ray 03/22/2016. FINDINGS: Lung volumes are low. No consolidative airspace disease. Mild diffuse interstitial prominence,  chronic and similar to numerous prior examinations. No pleural effusions. No pneumothorax. No pulmonary nodule or mass noted. Pulmonary vasculature and the cardiomediastinal silhouette are within normal limits. Suture line projecting over the lower left hemithorax. IMPRESSION: 1. Low lung volumes without radiographic evidence of acute cardiopulmonary disease. 2. Aortic atherosclerosis. Electronically Signed   By: Trudie Reedaniel  Entrikin M.D.   On: 10/05/2017 11:04    Procedures Procedures (including critical care time)  Medications Ordered in ED Medications  0.9 %  sodium chloride infusion ( Intravenous New Bag/Given 10/05/17 1446)  hydrALAZINE (APRESOLINE) injection 20 mg (20 mg Intravenous Given 10/05/17 1457)  sodium chloride 0.9 % bolus 500 mL (0 mLs Intravenous Stopped 10/05/17 1425)     Initial Impression / Assessment and Plan / ED Course  I have reviewed the triage vital signs and the nursing notes.  Pertinent labs & imaging results that were available during my care of the patient were reviewed by me and considered in my medical decision making (see chart for details).     Patient with advanced dementia here following episode of unresponsiveness.  When daughter arrived to the emergency department for additional history she did state that her mother appeared to be at her baseline.  Concern for possible seizure versus choking episode resulting in syncopal episode.  Patient does have risk factors for both to occur.  Medicine consulted for observation for syncope versus jerking versus seizure.  Final Clinical Impressions(s) / ED Diagnoses   Final diagnoses:  None    ED Discharge Orders    None       Tilden Fossaees, Shiara Mcgough, MD 10/05/17 [phone]

## 2017-10-05 NOTE — ED Triage Notes (Signed)
[facility] for evaluation of sudden onset unresponsiveness 45 min prior to arrival. Reportedly became unresponsiveness while caregiver was bathing her, states began "clawing herself" and then lost consciousness. Mouth smacking noted on arrival, patient mumbling, resisting exam. Known hx of alzheimer's dementia. Hypertensive at 235/83, bradycardia per EMS when pulling into the bay, at this time rate is 96.

## 2017-10-05 NOTE — H&P (Addendum)
History and Physical    Deborah Barrelma C Wortley ZOX:[id number] DOB: 10/24/1923 DOA: 10/05/2017  PCP: [facility]  Patient coming from: Home  Chief Complaint: Strange unusual episode  HPI: [name] is a 84 y.o. female with medical history significant of advanced dementia who is bedbound and nonverbal, schizophrenia, chronic back pain, hypertension, GERD, lives with her daughter at home and is fully taking care of by her daughter at home.  Earlier  this morning when the daughter went to go check on her she seemed like she was unable to clear her throat.  Her daughter sat her up in the bed and all of a sudden the patient clutched her chest and scratched her neck causing herself to bleed and appeared like she was in extreme pain.  This lasted for several minutes.  The daughter says that her arms were crossed across her chest and she was clutching at her throat and was shaking.  Her eyes remained open during this whole entire episode.  But her face looked like she was in pain.  There was no vomiting.  This happened for several minutes and then seemed to subside.  911 was called and by the time 911 got there the episode had resolved.  She is currently back into her normal mental status which is extremely poor.  She has not had any recent illnesses.  She has not had any fevers, nausea, vomiting, diarrhea.  During this episode she did manage to scratch her neck and she now has a skin tear there.  Daughter says she thickened her liquids at home think thickens all of her food at home and if she is still able to eat without choking.  She has been recently evaluated by Dr. Dionicia Ableraman and read to feel who is evaluating her and planning on doing a PEG tube placement in the next week or so.  She has been evaluated by IR here who did not think she was an appropriate feeding tube candidate.  Patient is currently now appearing comfortable laying in bed in her normal state.  She cannot answer questions due to her advanced  dementia.  She is being referred for admission for workup of this unusual episode that occurred this morning.  Review of Systems: As per HPI otherwise 10 point review of systems negative per daughter  Past Medical History:  Diagnosis Date  . Alzheimer disease   . Chronic back pain   . Clostridium difficile colitis   . Hiatal hernia   . Hypertension   . Reflux   . Schizophrenia (HCC)   . Vertebral compression fracture [facility](HCC)     Past Surgical History:  Procedure Laterality Date  . CAROTID ENDARTERECTOMY    . CAROTID STENT    . HIP ARTHROPLASTY Right 03/24/2016   Procedure: ARTHROPLASTY BIPOLAR HIP (HEMIARTHROPLASTY);  Surgeon: Durene RomansMatthew Olin, MD;  [facility];  Service: Orthopedics;  Laterality: Right;  . LUNG REMOVAL, PARTIAL    . tumor of lung -benign , and removed.       reports that  has never smoked. she has never used smokeless tobacco. She reports that she does not drink alcohol or use drugs.  Allergies  Allergen Reactions  . Lactose Intolerance (Gi) Other (See Comments) and Cough    EYES WATERING, SNEEZING, FLU LIKE SYMPTOMS  . Codeine Other (See Comments)    UNKNOWN REACTION    Family History  Family history unknown: Yes  No premature coronary artery disease  Prior to Admission medications  Medication Sig Start Date End Date Taking? Authorizing Provider  Cholecalciferol (VITAMIN D3) 3000 units TABS Take 3,000 Units by mouth daily.    [provider]  Coenzyme Q10 (COQ10 PO) Take 1 tablet by mouth daily.    [provider]  Multiple Vitamin (MULTIVITAMIN) capsule Take 1 capsule by mouth daily. Put in applesauce or shakes    [provider]  NON FORMULARY Take 1 capsule by mouth daily. Boswella for joint support, bone health    [provider]    Physical Exam: Vitals:   10/05/17 1056 10/05/17 1130 10/05/17 1145 10/05/17 1245  BP:  (!) 208/88 (!) 222/77 (!) 206/90  Pulse:  77 74 89  Resp:  (!) 27 (!) 21 (!) 27  Temp: (!)  97.3 F (36.3 C)     TempSrc: Rectal     SpO2:  96% 94% 97%    Constitutional: NAD, calm, comfortable, advanced dementia nonverbal Vitals:   10/05/17 1056 10/05/17 1130 10/05/17 1145 10/05/17 1245  BP:  (!) 208/88 (!) 222/77 (!) 206/90  Pulse:  77 74 89  Resp:  (!) 27 (!) 21 (!) 27  Temp: (!) 97.3 F (36.3 C)     TempSrc: Rectal     SpO2:  96% 94% 97%   Eyes: PERRL, lids and conjunctivae normal ENMT: Mucous membranes are dry. Posterior pharynx clear of any exudate or lesions.Normal dentition.  Neck: normal, supple, no masses, no thyromegaly Respiratory: clear to auscultation bilaterally, no wheezing, no crackles. Normal respiratory effort. No accessory muscle use.  Cardiovascular: Regular rate and rhythm, no murmurs / rubs / gallops. No extremity edema. 2+ pedal pulses. No carotid bruits.  Abdomen: no tenderness, no masses palpated. No hepatosplenomegaly. Bowel sounds positive.  Musculoskeletal: no clubbing / cyanosis. No joint deformity upper and lower extremities. Good ROM, no contractures. Normal muscle tone.  Skin: no rashes, lesions, ulcers. No induration Neurologic: CN 2-12 grossly intact.  No focal deficits noted  psychiatric: Not agitated advanced dementia  Labs on Admission: I have personally reviewed following labs and imaging studies  CBC: Recent Labs  Lab 10/05/17 1046 10/05/17 1113  WBC 6.2  --   NEUTROABS 3.6  --   HGB 14.4 14.6  HCT 44.4 43.0  MCV 96.3  --   PLT 139*  --    Basic Metabolic Panel: Recent Labs  Lab 10/05/17 1046 10/05/17 1113  NA 142 143  K 3.6 3.6  CL 104 104  CO2 27  --   GLUCOSE 106* 102*  BUN 34* 33*  CREATININE 0.68 0.70  CALCIUM 9.7  --    GFR: CrCl cannot be calculated (Unknown ideal weight.). Liver Function Tests: Recent Labs  Lab 10/05/17 1046  AST 20  ALT 11*  ALKPHOS 66  BILITOT 1.1  PROT 6.4*  ALBUMIN 3.5   No results for input(s): LIPASE, AMYLASE in the last 168 hours. No results for input(s): AMMONIA in  the last 168 hours. Coagulation Profile: Recent Labs  Lab 10/05/17 1046  INR 0.92   Cardiac Enzymes: No results for input(s): CKTOTAL, CKMB, CKMBINDEX, TROPONINI in the last 168 hours. BNP (last 3 results) No results for input(s): PROBNP in the last 8760 hours. HbA1C: No results for input(s): HGBA1C in the last 72 hours. CBG: No results for input(s): GLUCAP in the last 168 hours. Lipid Profile: No results for input(s): CHOL, HDL, LDLCALC, TRIG, CHOLHDL, LDLDIRECT in the last 72 hours. Thyroid Function Tests: No results for input(s): TSH, T4TOTAL, FREET4, T3FREE, THYROIDAB in  the last 72 hours. Anemia Panel: No results for input(s): VITAMINB12, FOLATE, FERRITIN, TIBC, IRON, RETICCTPCT in the last 72 hours. Urine analysis:    Component Value Date/Time   COLORURINE YELLOW 02/10/2015 1948   APPEARANCEUR CLEAR 02/10/2015 1948   LABSPEC <1.005 (L) 02/10/2015 1948   PHURINE 5.5 02/10/2015 1948   GLUCOSEU NEGATIVE 02/10/2015 1948   HGBUR TRACE (A) 02/10/2015 1948   BILIRUBINUR NEGATIVE 02/10/2015 1948   KETONESUR NEGATIVE 02/10/2015 1948   PROTEINUR NEGATIVE 02/10/2015 1948   UROBILINOGEN 0.2 02/10/2015 1948   NITRITE NEGATIVE 02/10/2015 1948   LEUKOCYTESUR NEGATIVE 02/10/2015 1948   Sepsis Labs: !!!!!!!!!!!!!!!!!!!!!!!!!!!!!!!!!!!!!!!!!!!! @LABRCNTIP (procalcitonin:4,lacticidven:4) )No results found for this or any previous visit (from the past 240 hour(s)).   Radiological Exams on Admission: Ct Head Wo Contrast  Result Date: 10/05/2017 CLINICAL DATA:  Altered mental status with episode of unresponsiveness EXAM: CT HEAD WITHOUT CONTRAST TECHNIQUE: Contiguous axial images were obtained from the base of the skull through the vertex without intravenous contrast. COMPARISON:  June 09, 2014 FINDINGS: Brain: Motion artifact makes this study considerably less than optimal. There is moderate diffuse atrophy. There is no intracranial mass, hemorrhage, extra-axial fluid collection, or  midline shift. There is extensive small vessel disease throughout the centra semiovale bilaterally. No obvious acute infarct is evident with the admitted limitations due to motion artifact. Vascular: No hyperdense vessel is demonstrable on this study. There is calcification in each carotid siphon region. Skull: Bony calvarium appears intact with limitations due to patient motion. Sinuses/Orbits: There is opacification in the posterior sphenoid sinus region as well as in the posterior right maxillary antrum. There is opacification and thickening in multiple ethmoid air cells as well as in the right frontal sinus. Orbits appear symmetric bilaterally. Other: Mastoids appear grossly clear with limitations of motion. IMPRESSION: Study limited due to patient motion. Atrophy with supratentorial small vessel disease. No acute infarct evident with patient motion making assessment less than optimal. No hemorrhage or mass. Foci of arterial vascular calcification noted. Areas of paranasal sinus disease noted at several sites. Electronically Signed   By: Bretta Bang III M.D.   On: 10/05/2017 11:47   Dg Chest Port 1 View  Result Date: 10/05/2017 CLINICAL DATA:  84 year old female with history of altered mental status. EXAM: PORTABLE CHEST 1 VIEW COMPARISON:  Chest x-ray 03/22/2016. FINDINGS: Lung volumes are low. No consolidative airspace disease. Mild diffuse interstitial prominence, chronic and similar to numerous prior examinations. No pleural effusions. No pneumothorax. No pulmonary nodule or mass noted. Pulmonary vasculature and the cardiomediastinal silhouette are within normal limits. Suture line projecting over the lower left hemithorax. IMPRESSION: 1. Low lung volumes without radiographic evidence of acute cardiopulmonary disease. 2. Aortic atherosclerosis. Electronically Signed   By: Trudie Reed M.D.   On: 10/05/2017 11:04    EKG: Independently reviewed.  Normal sinus rhythm Case discussed with Dr.  Pecola Leisure in the ED Old chart reviewed Chest x-ray reviewed no edema or infiltrate   Assessment/Plan 84 year old female with advanced dementia who had an episode this morning of unclear etiology  Principal Problem:   Episode of abnormal behavior-very difficult to tell if patient was in pain due to chest pain or various number of reasons.  The daughter does not think she was choking.  Not sound like seizure activity.  Could have been a possible stroke however she is back to her baseline at this time.  Could have been an arrhythmia.  We will monitor overnight placed on a cardiac telemetry monitor to monitor for  any significant arrhythmias.  Will check frequent neurological checks.  Place her on seizure and aspiration precautions.  We will serial her troponin.  EKG is nonischemic at this time and her initial troponin is 0.  I have explained to the daughter that we may not get any definitive answers as to what exactly happened.  I do not think the patient will be able to undergo any further neurological imaging.  Active Problems:   Dementia-noted    Schizophrenia (HCC)-noted    Hypertension-uncontrolled her systolics are consistently around 200 and above.   Placed on hydralazine 20 mg IV every 4 hours as needed at this time for hypertension    Chronic back pain-noted    Failure to thrive in adult-noted    Dysphagia-patient's daughter has specific requests about what to feed her.  I have discussed this with the emergency room nurse to try to abide by her diet rules.  She wishes to move forward and is still feed her by mouth understanding her high aspiration risk.  I have also discussed the role of PEG tube and advanced demented patients.  Dr. Dionicia Abler to place a PEG tube in her has agreed possibly later in the week.  His office is supposed to call her tomorrow to possibly arrange this.  I have discussed that a feeding tube or not alter the course of her disease.  She has a very poor insight into feeding  issues in her mother at this point.  Placed on aspiration precautions.   Patient daughter also has made it very clear that she does not want palliative care or hospice involved.  She feels that they  will overly drug her mother and sedate her.  The plan is for her to go home tomorrow sounds like she is doing a very good job of taking care of her mom at home with the help of her husband.   DVT prophylaxis: SCDs Code Status: DNR Family Communication: Daughter Disposition Plan: Per day team Consults called: None Admission status: Observation   Jermanie Minshall A MD [facility]  If 7PM-7AM, please contact night-coverage [email or web address] Password [facility]  10/05/2017, 1:32 PM  patient had another spell in the emergency department at that time she bradycardia down into the 20s.  Witnessed by nursing staff and said to not look like seizure activity.  Daughter is very anxious and has been yelling at staff in the hallways.  I had another discussion with her about the likely etiology of she having bradycardic spells.  Patient is not a pacemaker candidate and daughter does not wish to have a pacemaker.  I have discussed with the ED nurse in the room patient's natural evolution of disease progression with dementia.  Patient has advanced stages of disease.  She is afebrile.  Daughter is also requesting continuous EEG monitoring which is not medically necessary.  She is also requesting neurology consultation.  I have called Dr. Otelia Limes who recommends no further neurological inpatient consultation needs at this time.  Daughter is very pessimistic about hospice and palliative care.  After a very long discussion she is agreeable to palliative consult.  She is mostly concerned about hospice over sedating her mother with morphine to kill her.  I have educated the daughter that this is not the intention of hospice care at home.  And that she can have full control over the sedatives her mother is receiving at home with  hospice also involved.  She is agreeable to speak to palliative care in the morning.  Daughter is also very upset because they have not been given any food for her mother to eat while waiting in the emergency department.  I have asked nursing staffing once again to order thickened diet for her to feed her mother as she wishes.  Palliative care consult placed in chart.  Daughter also wishes for her to be video monitor which we will order.  Reassurance provided to the daughter.  I think most of her acting out is underlying anxiety issues and stress from having to take care of her mom for 8 years at home.  We will try to set up as much help at home at this time.  She has no home health agency was arranged for the last 8 years.

## 2017-10-05 NOTE — ED Notes (Signed)
MD Onalee Huaavid and this RN in room, discussed pt plan of care. Daughter to make RN aware if she feels pt is in pain.

## 2017-10-05 NOTE — ED Notes (Signed)
Per daughter/care taker... The patient is back to her baseline, irregular movements with mouth are baseline, garbled language is her baseline.

## 2017-10-05 NOTE — ED Notes (Signed)
Ordered soft food diet: yogurt and pudding per daughter.

## 2017-10-06 ENCOUNTER — Telehealth (INDEPENDENT_AMBULATORY_CARE_PROVIDER_SITE_OTHER): Payer: Self-pay | Admitting: *Deleted

## 2017-10-06 ENCOUNTER — Other Ambulatory Visit: Payer: Self-pay

## 2017-10-06 DIAGNOSIS — M549 Dorsalgia, unspecified: Secondary | ICD-10-CM | POA: Diagnosis present

## 2017-10-06 DIAGNOSIS — R1310 Dysphagia, unspecified: Secondary | ICD-10-CM | POA: Diagnosis present

## 2017-10-06 DIAGNOSIS — E739 Lactose intolerance, unspecified: Secondary | ICD-10-CM | POA: Diagnosis present

## 2017-10-06 DIAGNOSIS — F209 Schizophrenia, unspecified: Secondary | ICD-10-CM | POA: Diagnosis present

## 2017-10-06 DIAGNOSIS — F0150 Vascular dementia without behavioral disturbance: Secondary | ICD-10-CM | POA: Diagnosis not present

## 2017-10-06 DIAGNOSIS — R4182 Altered mental status, unspecified: Secondary | ICD-10-CM | POA: Diagnosis present

## 2017-10-06 DIAGNOSIS — R4689 Other symptoms and signs involving appearance and behavior: Secondary | ICD-10-CM | POA: Diagnosis not present

## 2017-10-06 DIAGNOSIS — R001 Bradycardia, unspecified: Secondary | ICD-10-CM | POA: Diagnosis not present

## 2017-10-06 DIAGNOSIS — G309 Alzheimer's disease, unspecified: Secondary | ICD-10-CM | POA: Diagnosis present

## 2017-10-06 DIAGNOSIS — G8929 Other chronic pain: Secondary | ICD-10-CM | POA: Diagnosis present

## 2017-10-06 DIAGNOSIS — Z66 Do not resuscitate: Secondary | ICD-10-CM | POA: Diagnosis present

## 2017-10-06 DIAGNOSIS — Z96641 Presence of right artificial hip joint: Secondary | ICD-10-CM | POA: Diagnosis present

## 2017-10-06 DIAGNOSIS — Z7401 Bed confinement status: Secondary | ICD-10-CM | POA: Diagnosis not present

## 2017-10-06 DIAGNOSIS — R627 Adult failure to thrive: Secondary | ICD-10-CM | POA: Diagnosis present

## 2017-10-06 DIAGNOSIS — G301 Alzheimer's disease with late onset: Secondary | ICD-10-CM | POA: Diagnosis not present

## 2017-10-06 DIAGNOSIS — R6251 Failure to thrive (child): Secondary | ICD-10-CM | POA: Diagnosis present

## 2017-10-06 DIAGNOSIS — I340 Nonrheumatic mitral (valve) insufficiency: Secondary | ICD-10-CM | POA: Diagnosis not present

## 2017-10-06 DIAGNOSIS — F0280 Dementia in other diseases classified elsewhere without behavioral disturbance: Secondary | ICD-10-CM | POA: Diagnosis not present

## 2017-10-06 DIAGNOSIS — Z515 Encounter for palliative care: Secondary | ICD-10-CM | POA: Diagnosis not present

## 2017-10-06 DIAGNOSIS — I10 Essential (primary) hypertension: Secondary | ICD-10-CM | POA: Diagnosis present

## 2017-10-06 DIAGNOSIS — R748 Abnormal levels of other serum enzymes: Secondary | ICD-10-CM | POA: Diagnosis present

## 2017-10-06 DIAGNOSIS — Z79899 Other long term (current) drug therapy: Secondary | ICD-10-CM | POA: Diagnosis not present

## 2017-10-06 DIAGNOSIS — K219 Gastro-esophageal reflux disease without esophagitis: Secondary | ICD-10-CM | POA: Diagnosis present

## 2017-10-06 LAB — BASIC METABOLIC PANEL
ANION GAP: 9 (ref 5–15)
BUN: 20 mg/dL (ref 6–20)
CALCIUM: 9.2 mg/dL (ref 8.9–10.3)
CO2: 25 mmol/L (ref 22–32)
CREATININE: 0.57 mg/dL (ref 0.44–1.00)
Chloride: 110 mmol/L (ref 101–111)
Glucose, Bld: 107 mg/dL — ABNORMAL HIGH (ref 65–99)
Potassium: 3.7 mmol/L (ref 3.5–5.1)
Sodium: 144 mmol/L (ref 135–145)

## 2017-10-06 LAB — CBC
HCT: 41.5 % (ref 36.0–46.0)
HEMOGLOBIN: 13.4 g/dL (ref 12.0–15.0)
MCH: 31 pg (ref 26.0–34.0)
MCHC: 32.3 g/dL (ref 30.0–36.0)
MCV: 96.1 fL (ref 78.0–100.0)
PLATELETS: 136 10*3/uL — AB (ref 150–400)
RBC: 4.32 MIL/uL (ref 3.87–5.11)
RDW: 12.7 % (ref 11.5–15.5)
WBC: 8.1 10*3/uL (ref 4.0–10.5)

## 2017-10-06 LAB — TROPONIN I
TROPONIN I: 0.15 ng/mL — AB (ref <0.03)
Troponin I: 0.09 ng/mL (ref <0.03)
Troponin I: 0.1 ng/mL (ref <0.03)
Troponin I: 0.07 ng/mL (ref <0.03)

## 2017-10-06 MED ORDER — HYDRALAZINE HCL 20 MG/ML IJ SOLN
10.0000 mg | Freq: Four times a day (QID) | INTRAMUSCULAR | Status: DC | PRN
  Administered 2017-10-06: 10 mg via INTRAVENOUS
  Filled 2017-10-06: qty 1

## 2017-10-06 MED ORDER — SODIUM CHLORIDE 0.9 % IV SOLN
INTRAVENOUS | Status: DC
  Administered 2017-10-06: 10:00:00 via INTRAVENOUS
  Administered 2017-10-07: 60 mL/h via INTRAVENOUS

## 2017-10-06 NOTE — ED Notes (Signed)
Bedside report given to Ryland RN

## 2017-10-06 NOTE — Progress Notes (Signed)
[facility]                                                                              Patient Demographics  [name], is a 84 y.o. female, DOB - May 01, 1924, ONG:[id number]  Admit date - 10/05/2017   Admitting Physician No admitting provider for patient encounter.  Outpatient Primary MD for the patient is Karna Dupes, MD  Outpatient specialists:   LOS - 0  days   Medical records reviewed and are as summarized below:    No chief complaint on file.      Brief summary   Deborah SUTCLIFFE is a 84 y.o. female with medical history significant of advanced dementia who is bedbound and nonverbal, schizophrenia, chronic back pain, hypertension, GERD, lives with her daughter at home and is fully taking care of by her daughter at home.    On the day of admission, when daughter checked on the patient, seems like patient was unable to clear her throat and then all of a sudden, patient clutched her chest and scratched her neck causing herself to bleed and seemed to be in pain. This lasted for several minutes.  No vomiting.  Patient's symptoms had resolved at the time of EMS arrival.  Patient also has history of aspiration, recently evaluated by GI at [facility], Dr Karilyn Cota, planning PEG tube placement in the next week or so. She has been evaluated by IR here who did not think she was an appropriate feeding tube candidate.    Assessment & Plan    Principal Problem:   Episode of abnormal behavior : Unclear what the episode was -Patient had another episode in ED, witnessed by the staff, did not appear to be seizure activity.  However with episode, patient had bradycardia spell with heart rate in 20s.  Patient daughter did not want any pacemaker or invasive procedures -The admitting physician also discussed with neurology, Dr. Georg Ruddle who did not feel patient needs any neurology workup at this time and recommended outpatient follow-up if needed - Discussed in detail with patient's  daughter given-positive troponins, she wants to have an echocardiogram to see her heart function.  I have discussed in detail that patient is not a candidate for any invasive procedure including cardiac cath.  -She is agreeable to have palliative consult although does not want residential hospice, she wants to assess if home health nurse vs hospice nurse would be more feasible for her care  - palliative care consult placed   Active Problems:   Dementia with Schizophrenia (HCC) -Currently appears at baseline    Hypertension Continue IV hydralazine as needed  with parameters    Chronic back pain, Failure to thrive in adult  - continue pain control as needed, palliative care consult placed    Dysphagia - Patient's daughter wants to continue dysphagia diet, she will address PEG tube with Dr Karilyn [facility]ota at [facility]    Code Status:  DNR  DVT Prophylaxis:  LSCD's Family Communication: Discussed in detail with the patient, all imaging results, lab results explained to the patient's daughter    Disposition Plan:   Time Spent in minutes  35  minutes  Procedures:    Consultants:   Palliative   Antimicrobials:      Medications  Scheduled Meds: Continuous Infusions: . sodium chloride 60 mL/hr at 10/06/17 0953   PRN Meds:.hydrALAZINE, morphine injection   Antibiotics   Anti-infectives (From admission, onward)   None        Subjective:   Sherrice Creekmore was seen and examined today.  Unable to obtain review of system from the patient due to her mental status, daughter at the bedside.    Objective:   Vitals:   10/06/17 0930 10/06/17 1000 10/06/17 1030 10/06/17 1100  BP: 135/60 (!) 170/76 (!) 167/75 (!) 170/68  Pulse: 63 81 80 73  Resp: 20 (!) 30 (!) 24 (!) 22  Temp:      TempSrc:      SpO2: 95% 100% 95% 96%    Intake/Output Summary (Last 24 hours) at 10/06/2017 1206 Last data filed at 10/06/2017 0152 Gross per 24 hour  Intake 1700 ml  Output -  Net 1700 ml     Wt  Readings from Last 3 Encounters:  03/15/17 49.9 kg (110 lb)  02/10/15 49.1 kg (108 lb 3.9 oz)  06/12/14 48 kg (105 lb 13.1 oz)     Exam  General: Somnolent, NAD  Eyes: Dry mucosal membranes  HEENT:,   Cardiovascular: S1 S2 auscultated, Regular rate and rhythm.  Respiratory: Clear to auscultation bilaterally, no wheezing, rales or rhonchi  Gastrointestinal: Soft, nontender, nondistended, + bowel sounds  Ext: no pedal edema bilaterally  Neuro: unable to assess  Musculoskeletal: No digital cyanosis, clubbing  Skin: No rashes  Psych: somnolent, dementia   Data Reviewed:  I have personally reviewed following labs and imaging studies  Micro Results No results found for this or any previous visit (from the past 240 hour(s)).  Radiology Reports Ct Head Wo Contrast  Result Date: 10/05/2017 CLINICAL DATA:  Altered mental status with episode of unresponsiveness EXAM: CT HEAD WITHOUT CONTRAST TECHNIQUE: Contiguous axial images were obtained from the base of the skull through the vertex without intravenous contrast. COMPARISON:  June 09, 2014 FINDINGS: Brain: Motion artifact makes this study considerably less than optimal. There is moderate diffuse atrophy. There is no intracranial mass, hemorrhage, extra-axial fluid collection, or midline shift. There is extensive small vessel disease throughout the centra semiovale bilaterally. No obvious acute infarct is evident with the admitted limitations due to motion artifact. Vascular: No hyperdense vessel is demonstrable on this study. There is calcification in each carotid siphon region. Skull: Bony calvarium appears intact with limitations due to patient motion. Sinuses/Orbits: There is opacification in the posterior sphenoid sinus region as well as in the posterior right maxillary antrum. There is opacification and thickening in multiple ethmoid air cells as well as in the right frontal sinus. Orbits appear symmetric bilaterally. Other:  Mastoids appear grossly clear with limitations of motion. IMPRESSION: Study limited due to patient motion. Atrophy with supratentorial small vessel disease. No acute infarct evident with patient motion making assessment less than optimal. No hemorrhage or mass. Foci of arterial vascular calcification noted. Areas of paranasal sinus disease noted at several sites. Electronically Signed   By: Bretta Bang III M.D.   On: 10/05/2017 11:47   Dg Chest Port 1 View  Result Date: 10/05/2017 CLINICAL DATA:  84 year old female with history of altered mental status. EXAM: PORTABLE CHEST 1 VIEW COMPARISON:  Chest x-ray 03/22/2016. FINDINGS: Lung volumes are low. No consolidative airspace disease. Mild diffuse interstitial prominence, chronic and similar to  numerous prior examinations. No pleural effusions. No pneumothorax. No pulmonary nodule or mass noted. Pulmonary vasculature and the cardiomediastinal silhouette are within normal limits. Suture line projecting over the lower left hemithorax. IMPRESSION: 1. Low lung volumes without radiographic evidence of acute cardiopulmonary disease. 2. Aortic atherosclerosis. Electronically Signed   By: Trudie Reedaniel  Entrikin M.D.   On: 10/05/2017 11:04    Lab Data:  CBC: Recent Labs  Lab 10/05/17 1046 10/05/17 1113 10/06/17 0725  WBC 6.2  --  8.1  NEUTROABS 3.6  --   --   HGB 14.4 14.6 13.4  HCT 44.4 43.0 41.5  MCV 96.3  --  96.1  PLT 139*  --  136*   Basic Metabolic Panel: Recent Labs  Lab 10/05/17 1046 10/05/17 1113 10/06/17 0725  NA 142 143 144  K 3.6 3.6 3.7  CL 104 104 110  CO2 27  --  25  GLUCOSE 106* 102* 107*  BUN 34* 33* 20  CREATININE 0.68 0.70 0.57  CALCIUM 9.7  --  9.2   GFR: CrCl cannot be calculated (Unknown ideal weight.). Liver Function Tests: Recent Labs  Lab 10/05/17 1046  AST 20  ALT 11*  ALKPHOS 66  BILITOT 1.1  PROT 6.4*  ALBUMIN 3.5   No results for input(s): LIPASE, AMYLASE in the last 168 hours. No results for  input(s): AMMONIA in the last 168 hours. Coagulation Profile: Recent Labs  Lab 10/05/17 1046  INR 0.92   Cardiac Enzymes: Recent Labs  Lab 10/05/17 1446 10/05/17 2043 10/06/17 0203 10/06/17 0725  TROPONINI <0.03 0.06* 0.15* 0.09*   BNP (last 3 results) No results for input(s): PROBNP in the last 8760 hours. HbA1C: No results for input(s): HGBA1C in the last 72 hours. CBG: No results for input(s): GLUCAP in the last 168 hours. Lipid Profile: No results for input(s): CHOL, HDL, LDLCALC, TRIG, CHOLHDL, LDLDIRECT in the last 72 hours. Thyroid Function Tests: No results for input(s): TSH, T4TOTAL, FREET4, T3FREE, THYROIDAB in the last 72 hours. Anemia Panel: No results for input(s): VITAMINB12, FOLATE, FERRITIN, TIBC, IRON, RETICCTPCT in the last 72 hours. Urine analysis:    Component Value Date/Time   COLORURINE YELLOW 02/10/2015 1948   APPEARANCEUR CLEAR 02/10/2015 1948   LABSPEC <1.005 (L) 02/10/2015 1948   PHURINE 5.5 02/10/2015 1948   GLUCOSEU NEGATIVE 02/10/2015 1948   HGBUR TRACE (A) 02/10/2015 1948   BILIRUBINUR NEGATIVE 02/10/2015 1948   KETONESUR NEGATIVE 02/10/2015 1948   PROTEINUR NEGATIVE 02/10/2015 1948   UROBILINOGEN 0.2 02/10/2015 1948   NITRITE NEGATIVE 02/10/2015 1948   LEUKOCYTESUR NEGATIVE 02/10/2015 1948     Guage Efferson M.D. [facility] 10/06/2017, 12:06 PM  Pager: [phone] Between 7am to 7pm - call Pager - [phone]  After 7pm go to [email or web address] - password [email or web address]  Call night coverage person covering after 7pm

## 2017-10-06 NOTE — Telephone Encounter (Signed)
Daughter had called Friday at 4:00 to check on her moms PEG.  She called back today and states her [facility] was [facility] with some kind of episode and they are uncertain as to what at this time.  She said they told her that they did not put PEG tubes in patient with her moms background of Alzheimers.    They are doing an ECHO today to see if heart related.  Please call her at [phone] cell she would like to talk to you and bring you up to date on her condition.

## 2017-10-06 NOTE — ED Notes (Signed)
Attempted to call report x 1  

## 2017-10-06 NOTE — Telephone Encounter (Signed)
Dr.Rehman is talking to patient's daughter this was forwarded to him to document.

## 2017-10-06 NOTE — Telephone Encounter (Signed)
Call returned. PEG on hold for now.

## 2017-10-06 NOTE — ED Notes (Signed)
Pt HR dropped from 104bpm to 35bpm, episode lasted for approx 10 seconds. Pt HR slowly increased to 94bpm. Pt mentation continues to be at baseline.

## 2017-10-06 NOTE — Telephone Encounter (Signed)
Call returned. PEG on hold for now. 

## 2017-10-06 NOTE — ED Notes (Signed)
Attempted to call report x2

## 2017-10-07 ENCOUNTER — Inpatient Hospital Stay (HOSPITAL_COMMUNITY): Payer: Medicare Other

## 2017-10-07 DIAGNOSIS — R4689 Other symptoms and signs involving appearance and behavior: Secondary | ICD-10-CM

## 2017-10-07 DIAGNOSIS — I340 Nonrheumatic mitral (valve) insufficiency: Secondary | ICD-10-CM

## 2017-10-07 DIAGNOSIS — Z515 Encounter for palliative care: Secondary | ICD-10-CM

## 2017-10-07 DIAGNOSIS — F0150 Vascular dementia without behavioral disturbance: Secondary | ICD-10-CM

## 2017-10-07 DIAGNOSIS — R1310 Dysphagia, unspecified: Secondary | ICD-10-CM

## 2017-10-07 DIAGNOSIS — R001 Bradycardia, unspecified: Secondary | ICD-10-CM

## 2017-10-07 LAB — ECHOCARDIOGRAM COMPLETE
Height: 62 in
Weight: 1760 oz

## 2017-10-07 MED ORDER — MORPHINE SULFATE 20 MG/5ML PO SOLN
2.5000 mg | ORAL | 0 refills | Status: AC | PRN
Start: 2017-10-07 — End: ?

## 2017-10-07 MED ORDER — ORAL CARE MOUTH RINSE
15.0000 mL | Freq: Two times a day (BID) | OROMUCOSAL | Status: DC
  Administered 2017-10-07: 15 mL via OROMUCOSAL

## 2017-10-07 MED ORDER — RESOURCE THICKENUP CLEAR PO POWD
ORAL | Status: DC | PRN
  Filled 2017-10-07: qty 125

## 2017-10-07 MED ORDER — HYDRALAZINE HCL 10 MG PO TABS: 10.0000 mg | ORAL_TABLET | Freq: Two times a day (BID) | ORAL | 3 refills | Status: AC

## 2017-10-07 MED ORDER — HYDRALAZINE HCL 10 MG PO TABS
10.0000 mg | ORAL_TABLET | Freq: Two times a day (BID) | ORAL | Status: DC
  Administered 2017-10-07: 10 mg via ORAL
  Filled 2017-10-07: qty 1

## 2017-10-07 NOTE — Social Work (Signed)
CSW consulted for transportation needs. CSW requested RN to call RN Case Manager to set up PTAR home.   CSW signing off. Please consult if any additional needs arise.  Deborah HutchingIsabel H Marguerette Sheller, LCSWA [facility] Clinical Social Work [phone]

## 2017-10-07 NOTE — Consult Note (Signed)
Consultation Note Date: 10/07/2017   Patient Name: [name]  DOB: August 05, 1924  MRN: [id number]  Age / Sex: 84 y.o., female  PCP: Raymondo Band, MD Referring Physician: Mendel Corning, MD  Reason for Consultation: Establishing goals of care  HPI/Patient Profile: 84 y.o. female  with past medical history of advanced dementia (bedbound and minimally verbal), schizophrenia, dysphagia who was admitted on 10/05/2017 with an episode of severe distress and shaking.  She was found to have mildly elevated troponin and episodes of severe bradycardia (20s).    Clinical Assessment and Goals of Care:  I have reviewed medical records including EPIC notes, labs and imaging, received report from [facility] attending MD, assessed the patient and then met at the bedside along with her daughter, Precious Bard  to discuss diagnosis prognosis, GOC, EOL wishes, disposition and options.  I introduced Palliative Medicine as specialized medical care for people living with serious illness. It focuses on providing relief from the symptoms and stress of a serious illness. The goal is to improve quality of life for both the patient and the family.  We discussed a brief life review of the patient.  Per Precious Bard prior to the dementia her mother was a very loving giving person who would give you the shirt off her back.  She did work in [employer] and she had 4? Children.  Her son has passed away, but her 3 daughters are alive.  She lives with Precious Bard as she has for the past 8 years.  They have always had a very close relationship.  Mrs. Pizzuto and Precious Bard are spiritual but no longer are able to go to church.  As far as functional and nutritional status Precious Bard takes most of the day to feed her mother.  She is extremely careful (and successful) with feeding her.  She eats pureed foods.  It seems she eats well for [facility] but not for others. Consequently the few times  Precious Bard has tried to go on a short vacation - the patient stops eating and Precious Bard has to quickly return from vacation.  Precious Bard describes at least two very bad experiences when she attempted to utilize respite care for her mother and her mother was unable to eat - or was not feed appropriately.  The patient has been bedbound since last February when she fell and had a hip fracture.  Precious Bard does an amazing job taking care of her mother.  She receives very little help from her sisters.   We discussed her current illness and what it means in the larger context of her on-going co-morbidities.  Natural disease trajectory and expectations at EOL were discussed.  Precious Bard understands her mother is drawing closer to end of life.  She understands that her mother's heart is likely weakening and she just wants her to be comfortable and cared for in the best way possible.  The difference between aggressive medical intervention and comfort care was considered in light of the patient's goals of care.   Precious Bard does not want aggressive  care.  She wants her mother to return to the hospital only if she can not be kept comfortable At home.  Hospice and Palliative Care services outpatient were explained and offered.  Patti elects Palliative care at home at this time.    Questions and concerns were addressed.  The family was encouraged to call with questions or concerns.    Primary Decision Maker:  NEXT OF KIN daughter Precious Bard    [place]    D/C home when medically ready with Palliative Care to follow outpatient.   Case Management order placed to offer choice of Palliative Care options in [facility].  NO PEG TUBE.  No invasive procedures (pacemaker)  Code Status/Advance Care Planning:  DNR   Symptom Management:   Morphine sublingual PRN for pain, SOB, agitation.  Patti and I discussed not using the morphine daily or on a regular basis, but rather only using it for symptomatic  emergencies.   Palliative Prophylaxis:   Aspiration and Frequent Pain Assessment  Prognosis:  Less than 6 months is reasonable given end stage dementia, dysphagia, and new episodes of severe bradycardia.   Discharge Planning: Home with Palliative Services      Primary Diagnoses: Present on Admission: . Episode of abnormal behavior . Chronic back pain . Failure to thrive in adult . Dementia . Hypertension . Schizophrenia ([place]) . Failure to thrive (0-17)   I have reviewed the medical record, interviewed the patient and family, and examined the patient. The following aspects are pertinent.  Past Medical History:  Diagnosis Date  . Alzheimer disease   . Chronic back pain   . Clostridium difficile colitis   . Hiatal hernia   . Hypertension   . Reflux   . Schizophrenia ([place])   . Vertebral compression fracture [facility])    Social History   Socioeconomic History  . Marital status: Divorced    Spouse name: None  . Number of children: None  . Years of education: None  . Highest education level: None  Social Needs  . Financial resource strain: None  . Food insecurity - worry: None  . Food insecurity - inability: None  . Transportation needs - medical: None  . Transportation needs - non-medical: None  Occupational History  . None  Tobacco Use  . Smoking status: Never Smoker  . Smokeless tobacco: Never Used  Substance and Sexual Activity  . Alcohol use: No  . Drug use: No  . Sexual activity: None  Other Topics Concern  . None  Social History Narrative  . None   Family History  Family history unknown: Yes   Scheduled Meds: . hydrALAZINE  10 mg Oral BID  . mouth rinse  15 mL Mouth Rinse BID   Continuous Infusions: . sodium chloride 60 mL/hr (10/07/17 0250)   PRN Meds:.hydrALAZINE, morphine injection, RESOURCE THICKENUP CLEAR Allergies  Allergen Reactions  . Lactose Intolerance (Gi) Other (See Comments) and Cough    EYES WATERING, SNEEZING, FLU LIKE  SYMPTOMS  . Codeine Other (See Comments)    UNKNOWN REACTION   Review of Systems patient is non-verbal.  Physical Exam  Well developed very elderly female, lying comfortably in bed, opens eyes CV faint sounds Resp no distress, lightly snoring. Abdomen soft, nt Extremities trace edema, warm to touch.  Vital Signs: BP (!) 154/57 (BP Location: Right Arm)   Pulse 61   Temp 98 F (36.7 C) (Axillary)   Resp 17   Ht 5' 2"  (1.575 m)   Wt 49.9  kg (110 lb)   SpO2 97%   BMI 20.12 kg/m  Pain Assessment: Faces   Pain Score: Asleep   SpO2: SpO2: 97 % O2 Device:SpO2: 97 % O2 Flow Rate: .   IO: Intake/output summary:   Intake/Output Summary (Last 24 hours) at 10/07/2017 1023 Last data filed at 10/07/2017 0700 Gross per 24 hour  Intake 1267 ml  Output 850 ml  Net 417 ml    LBM: Last BM Date: 10/04/17 Baseline Weight: Weight: 49.9 kg (110 lb) Most recent weight: Weight: 49.9 kg (110 lb)     Palliative Assessment/Data: 20%     Time In: 9:00 Time Out: 10:40 Time Total: 100 min. Greater than 50%  of this time was spent counseling and coordinating care related to the above assessment and plan.  Signed by: Florentina Jenny, PA-C Palliative Medicine Pager: [phone]  Please contact Palliative Medicine Team phone at [phone] for questions and concerns.  For individual provider: See Shea Evans

## 2017-10-07 NOTE — Evaluation (Signed)
Clinical/Bedside Swallow Evaluation Patient Details  Name: LINDEN MIKES MRN: [id number] Date of Birth: 1923-09-28  Today's Date: 10/07/2017 Time: SLP Start Time (ACUTE ONLY): 0850 SLP Stop Time (ACUTE ONLY): 0920 SLP Time Calculation (min) (ACUTE ONLY): 30 min  Past Medical History:  Past Medical History:  Diagnosis Date  . Alzheimer disease   . Chronic back pain   . Clostridium difficile colitis   . Hiatal hernia   . Hypertension   . Reflux   . Schizophrenia (HCC)   . Vertebral compression fracture [facility])    Past Surgical History:  Past Surgical History:  Procedure Laterality Date  . CAROTID ENDARTERECTOMY    . CAROTID STENT    . HIP ARTHROPLASTY Right 03/24/2016   Procedure: ARTHROPLASTY BIPOLAR HIP (HEMIARTHROPLASTY);  Surgeon: Durene Romans, MD;  Location: [facility] OR;  Service: Orthopedics;  Laterality: Right;  . LUNG REMOVAL, PARTIAL    . tumor of lung -benign , and removed.     HPI:  84 y.o.femalewith past medical history of advanced dementia (bedbound and minimally verbal), schizophrenia, dysphagiawho was admitted on 1/20/2019with an episode of severe distress and shaking. She was found to have mildly elevated troponin and episodes of severe bradycardia (20s). Pt has been seen in prior admissions, has consumed nectar thick liquids and pureed textures for many years. Careful hand feeding, primarily from family, has been successful due to cognitive dysphagia secondary to dementia. Daughter reports pt continues to tolerate this texture as best as can be expected though they had been recently considering a PEG tube for improved access to nutrition and hydration.   Assessment / Plan / Recommendation Clinical Impression  Pt demosntrates swallow function appearing consistent with baseline. Pt awakened easily, allowed repositioning with pillows and towels for head support, responded to careful hand feeding with tactile cues and gentle pressure with spoon for bolus acceptance. Daughter  verbalized these methods as well and awareness of abilty to eat and drink with the challenges that advanced dementia poses.  Lingual pumping present, delayed swallow, occasional throat clear or cough indicating probable oral and ororpharyngeal residuals. Discussed precautions and reinforced daughters methods. Will resume nectar thick liquids and puree with assist from daughter. No SLP f/u needed.  SLP Visit Diagnosis: Dysphagia, oral phase (R13.11);Dysphagia, oropharyngeal phase (R13.12)    Aspiration Risk  Moderate aspiration risk;Risk for inadequate nutrition/hydration    Diet Recommendation Dysphagia 1 (Puree);Nectar-thick liquid   Liquid Administration via: Spoon;Cup Medication Administration: Crushed with puree Supervision: Full supervision/cueing for compensatory strategies Compensations: Slow rate;Small sips/bites;Minimize environmental distractions Postural Changes: Remain upright for at least 30 minutes after po intake;Seated upright at 90 degrees    Other  Recommendations Oral Care Recommendations: Staff/trained caregiver to provide oral care   Follow up Recommendations        Frequency and Duration            Prognosis        Swallow Study   General HPI: 84 y.o.femalewith past medical history of advanced dementia (bedbound and minimally verbal), schizophrenia, dysphagiawho was admitted on 1/20/2019with an episode of severe distress and shaking. She was found to have mildly elevated troponin and episodes of severe bradycardia (20s). Pt has been seen in prior admissions, has consumed nectar thick liquids and pureed textures for many years. Careful hand feeding, primarily from family, has been successful due to cognitive dysphagia secondary to dementia. Daughter reports pt continues to tolerate this texture as best as can be expected though they had been recently considering a PEG tube  for improved access to nutrition and hydration. Type of Study: Bedside Swallow  Evaluation Previous Swallow Assessment: see HPI Diet Prior to this Study: Dysphagia 3 (soft);Pudding-thick liquids Temperature Spikes Noted: No Respiratory Status: Room air History of Recent Intubation: No Behavior/Cognition: Alert;Cooperative;Requires cueing Oral Cavity Assessment: Dry Oral Care Completed by SLP: No Oral Cavity - Dentition: Edentulous Self-Feeding Abilities: Total assist Patient Positioning: Postural control adequate for testing(repositioned upright with head support) Baseline Vocal Quality: Low vocal intensity Volitional Cough: Cognitively unable to elicit Volitional Swallow: Unable to elicit    Oral/Motor/Sensory Function Overall Oral Motor/Sensory Function: Other (comment)(unable to follow commands, strength adequate)   Ice Chips Ice chips: Not tested   Thin Liquid Thin Liquid: Not tested    Nectar Thick Nectar Thick Liquid: Impaired Presentation: Spoon Oral Phase Impairments: Reduced lingual movement/coordination;Poor awareness of bolus Oral phase functional implications: Right anterior spillage;Left anterior spillage;Prolonged oral transit Pharyngeal Phase Impairments: Throat Clearing - Immediate   Honey Thick Honey Thick Liquid: Not tested   Puree Puree: Impaired Presentation: Spoon Oral Phase Impairments: Reduced lingual movement/coordination Oral Phase Functional Implications: Prolonged oral transit   Solid   GO   Solid: Not tested        Larz Mark, Riley NearingBonnie Caroline 10/07/2017,11:17 AM

## 2017-10-07 NOTE — Progress Notes (Signed)
  Echocardiogram 2D Echocardiogram has been performed.  Leta JunglingCooper, Gage Treiber M 10/07/2017, 12:18 PM

## 2017-10-07 NOTE — Discharge Summary (Signed)
Physician Discharge Summary   Patient ID: [name] MRN: [id number] DOB/AGE: Dec 15, 1923 84 y.o.  Admit date: 10/05/2017 Discharge date: 10/07/2017  Primary Care Physician:  Karna Dupes, MD  Discharge Diagnoses:    . Episode of abnormal behavior   Bradycardia  . Chronic back pain . Failure to thrive in adult . Dementia . Hypertension . Schizophrenia (HCC) .  Elevated troponins   Consults:  Palliative medicine  Recommendations for Outpatient Follow-up:  1. Please repeat CBC/BMET at next visit   DIET: Dysphagia 1 diet with nectar thick liquids    Allergies:   Allergies  Allergen Reactions  . Lactose Intolerance (Gi) Other (See Comments) and Cough    EYES WATERING, SNEEZING, FLU LIKE SYMPTOMS  . Codeine Other (See Comments)    UNKNOWN REACTION     DISCHARGE MEDICATIONS: Allergies as of 10/07/2017      Reactions   Lactose Intolerance (gi) Other (See Comments), Cough   EYES WATERING, SNEEZING, FLU LIKE SYMPTOMS   Codeine Other (See Comments)   UNKNOWN REACTION      Medication List    TAKE these medications   allantoin gel Apply 1 application topically as needed for wound care.   COQ10 PO Take 1 tablet by mouth daily.   Cranberry 1000 MG Caps Take 1,000 mg by mouth daily.   hydrALAZINE 10 MG tablet Commonly known as:  APRESOLINE Take 1 tablet (10 mg total) by mouth 2 (two) times daily.   morphine 20 MG/5ML solution Take 0.6 mLs (2.4 mg total) by mouth every 4 (four) hours as needed for pain.   multivitamin capsule Take 1 capsule by mouth daily. Put in applesauce or shakes   NON FORMULARY Take 1 capsule by mouth daily. [place] for joint support, bone health   Vitamin D3 5000 units Caps Take 5,000 Units by mouth daily.        Brief H and P: For complete details please refer to admission H and P, but in brief [name] a 84 y.o.femalewith medical history significant ofadvanced dementia who is bedbound and nonverbal,  schizophrenia, chronic back pain, hypertension, GERD, lives with her daughter at home and is fully taking care of by her daughter at home.   On the day of admission, when daughter checked on the patient, seems like patient was unable to clear her throat and then all of a sudden, patient clutched her chest and scratched her neck causing herself to bleed and seemed to be in pain.This lasted for several minutes.  No vomiting.  Patient's symptoms had resolved at the time of EMS arrival. Patient also has history of aspiration, recently evaluated by GI at [facility], Dr Karilyn Cota, planning PEG tube placement in the next week or so. She has been evaluated by IR here who did not think she was an appropriate feeding tube candidate.   Hospital Course:    Episode of abnormal behavior : Unclear etiology -Patient had another episode in ED, witnessed by the staff, did not appear to be seizure activity.  However with episode, patient had bradycardia spell with heart rate in 20s.  Patient daughter did not want any pacemaker or invasive procedures.  -The admitting physician also discussed with neurology, Dr. Georg Ruddle who did not feel patient needs any neurology workup at this time and recommended outpatient follow-up if needed - Discussed in detail with patient's daughter given-positive troponins, she wanted to have an echocardiogram to see her heart function.  I have discussed in detail that patient is not  a candidate for any invasive procedure including cardiac cath.  -2-D echo showed EF of 60-65% with grade 2 diastolic dysfunction - Patient's daughter was agreeable to palliative consult, seen by palliative medicine, [name]. Goals of care were addressed, patient will be followed by hospice. Also recommend PT aid of care to follow outpatient - At this time patient's daughter does not wish to have any invasive procedures, pacemaker, PEG tube etc. DO NOT RESUSCITATE status.     Dementia with Schizophrenia  (HCC) -Currently appears at baseline    Hypertension Placed on low-dose hydralazine 10 mg twice a day    Chronic back pain, Failure to thrive in adult  - continue pain control as needed, given prescription for low-dose sublingual morphine for severe pain, shortness of breath - Patient will be followed by hospice    Dysphagia - Patient's daughter wants to continue dysphagia 1 diet with nectar thin liquids. She does not want to pursue PEG tube at this time     Day of Discharge BP (!) 154/57 (BP Location: Right Arm)   Pulse 61   Temp 98 F (36.7 C) (Axillary)   Resp 17   Ht 5\' 2"  (1.575 m)   Wt 49.9 kg (110 lb)   SpO2 97%   BMI 20.12 kg/m   Physical Exam: General: Alert and awake, eating with assistance HEENT: anicteric sclera, pupils reactive to light and accommodation CVS: S1-S2 clear no murmur rubs or gallops Chest: clear to auscultation bilaterally, no wheezing rales or rhonchi Abdomen: soft nontender, nondistended, normal bowel sounds Extremities: no cyanosis, clubbing or edema noted bilaterally    The results of significant diagnostics from this hospitalization (including imaging, microbiology, ancillary and laboratory) are listed below for reference.    LAB RESULTS: Basic Metabolic Panel: Recent Labs  Lab 10/05/17 1046 10/05/17 1113 10/06/17 0725  NA 142 143 144  K 3.6 3.6 3.7  CL 104 104 110  CO2 27  --  25  GLUCOSE 106* 102* 107*  BUN 34* 33* 20  CREATININE 0.68 0.70 0.57  CALCIUM 9.7  --  9.2   Liver Function Tests: Recent Labs  Lab 10/05/17 1046  AST 20  ALT 11*  ALKPHOS 66  BILITOT 1.1  PROT 6.4*  ALBUMIN 3.5   No results for input(s): LIPASE, AMYLASE in the last 168 hours. No results for input(s): AMMONIA in the last 168 hours. CBC: Recent Labs  Lab 10/05/17 1046 10/05/17 1113 10/06/17 0725  WBC 6.2  --  8.1  NEUTROABS 3.6  --   --   HGB 14.4 14.6 13.4  HCT 44.4 43.0 41.5  MCV 96.3  --  96.1  PLT 139*  --  136*   Cardiac  Enzymes: Recent Labs  Lab 10/06/17 1238 10/06/17 1939  TROPONINI 0.10* 0.07*   BNP: Invalid input(s): POCBNP CBG: No results for input(s): GLUCAP in the last 168 hours.  Significant Diagnostic Studies:  Ct Head Wo Contrast  Result Date: 10/05/2017 CLINICAL DATA:  Altered mental status with episode of unresponsiveness EXAM: CT HEAD WITHOUT CONTRAST TECHNIQUE: Contiguous axial images were obtained from the base of the skull through the vertex without intravenous contrast. COMPARISON:  June 09, 2014 FINDINGS: Brain: Motion artifact makes this study considerably less than optimal. There is moderate diffuse atrophy. There is no intracranial mass, hemorrhage, extra-axial fluid collection, or midline shift. There is extensive small vessel disease throughout the centra semiovale bilaterally. No obvious acute infarct is evident with the admitted limitations due to motion artifact. Vascular:  No hyperdense vessel is demonstrable on this study. There is calcification in each carotid siphon region. Skull: Bony calvarium appears intact with limitations due to patient motion. Sinuses/Orbits: There is opacification in the posterior sphenoid sinus region as well as in the posterior right maxillary antrum. There is opacification and thickening in multiple ethmoid air cells as well as in the right frontal sinus. Orbits appear symmetric bilaterally. Other: Mastoids appear grossly clear with limitations of motion. IMPRESSION: Study limited due to patient motion. Atrophy with supratentorial small vessel disease. No acute infarct evident with patient motion making assessment less than optimal. No hemorrhage or mass. Foci of arterial vascular calcification noted. Areas of paranasal sinus disease noted at several sites. Electronically Signed   By: Bretta Bang III M.D.   On: 10/05/2017 11:47   Dg Chest Port 1 View  Result Date: 10/05/2017 CLINICAL DATA:  84 year old female with history of altered mental status.  EXAM: PORTABLE CHEST 1 VIEW COMPARISON:  Chest x-ray 03/22/2016. FINDINGS: Lung volumes are low. No consolidative airspace disease. Mild diffuse interstitial prominence, chronic and similar to numerous prior examinations. No pleural effusions. No pneumothorax. No pulmonary nodule or mass noted. Pulmonary vasculature and the cardiomediastinal silhouette are within normal limits. Suture line projecting over the lower left hemithorax. IMPRESSION: 1. Low lung volumes without radiographic evidence of acute cardiopulmonary disease. 2. Aortic atherosclerosis. Electronically Signed   By: Trudie Reed M.D.   On: 10/05/2017 11:04    2D ECHO:   Disposition and Follow-up: Discharge Instructions    Discharge instructions   Complete by:  As directed    Dysphagia 1 diet       DISPOSITION: Home with hospice DISCHARGE FOLLOW-UP Follow-up Information    Karna Dupes, MD. Schedule an appointment as soon as possible for a visit in 2 week(s).   Specialty:  Family Medicine Contact information: [address] [place] [address] [phone]            Time spent on Discharge: 25 minute  Signed:   Thad Ranger M.D. [facility] 10/07/2017, 12:40 PM Pager: [phone]

## 2017-10-08 NOTE — Consult Note (Signed)
           [facility] Primary Care Navigator  10/08/2017  Yetta Barrelma C Mano 04/16/1924 [id number]   Attempt to see patient at the bedside to identify possible discharge needs but she was alreadydischargedper staff report.  Per chart review, patient wasadmitted on 10/05/2017 with an episode of severe distress and shaking.  She was found to have mildly elevated troponin and episodes of severe bradycardia (20s). Patient has end stage dementia.  Patient was discharged home yesterday. Per MD note, patient lives with her daughter and is fully taken cared of by her daughter at home. Patient's daughter was agreeable to palliative consult, seen by palliative care (M. Dellinger) and goals of care were addressed-  with palliative care to follow outpatient.  Several attempts to call Dr. Blenda MountsKhashana Blake with [facility] but was unsuccessful.    Primary care provider's officecalled ([facility] and confirmed that patient has been under the service of Dr. Shaune PollackEd Hawkins and was last seen in 2017. Notified of patient's discharge and need for post hospital follow-up and transition of care (TOC). Informed of patient's health issues needing follow-up.  Made aware to [facility] if deemed necessary and appropriate for services.   For questions, please contact:  Wyatt HasteLorraine Syrenity Klepacki, BSN, RN- [facility] Primary Care Navigator  Telephone: [phone] [facility]

## 2017-10-09 ENCOUNTER — Telehealth: Payer: Self-pay

## 2017-10-09 NOTE — Telephone Encounter (Signed)
Palliative Medicine RN Note: Rec'd call from daughter [name] requesting information about echo that was performed while patient was admitted and requesting to talk to [facility]. [name] is very concerned about echo results and reports that she was never given the results; she is concerned about "blockages" and "other heart attacks." [name] verbalized frustration that no cardiologist called her to discuss the results. She went home with an outpatient palliative care referral (not hospice).  I referred her to medical records to request a copy of the test results, as our providers cannot provide copies of records. I suggested she ask her mother's physician explain the results at her follow up appointment, but she reports [name] does not go to the MD and that they only get house calls as needed. I also explained that we only see patients while they are admitted, but she is adamant that [name] told her that she would go over the echo results with her. [name] is not in the office today, so I left a message with [name]'s request. I explained that [name] will likely not be able to return her call until next week and that she can most likely point us in the right direction for results.   Margret ChanceMelanie G. Crist Kruszka, RN, BSN, [facility] Palliative Medicine Team 10/09/2017 10:32 AM Office [phone]

## 2018-04-24 IMAGING — CT CT HEAD W/O CM
5 of 9 series · 16 of 47 positions shown, 17 images · non-contrast
Comparison: June 09, 2014

CLINICAL DATA: Altered mental status with episode of
unresponsiveness

EXAM:
CT HEAD WITHOUT CONTRAST
TECHNIQUE: Contiguous axial images were obtained from the base of the skull
through the vertex without intravenous contrast.

[Series 3: head 5.0 h30s · axial · 0.41mm/px · z∈[-146,-51]mm · 3 of 39 slices shown, 4 images]
[im 10/39  brain]
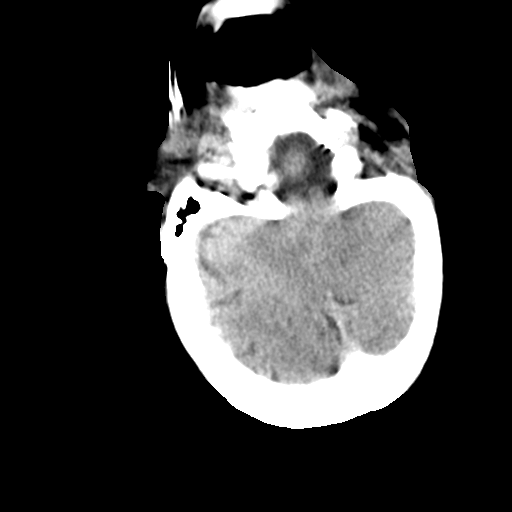
[im 10/39  bone]
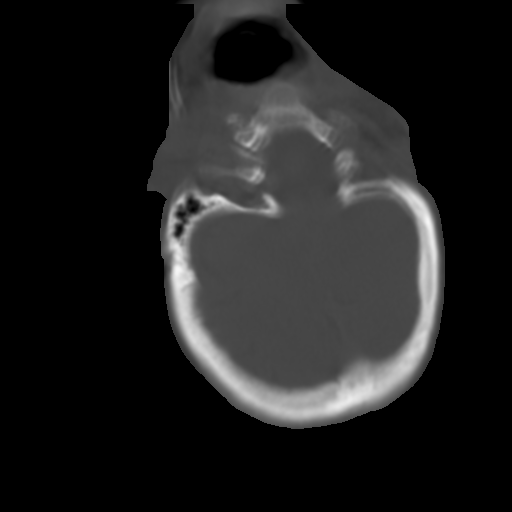
[im 20/39  brain]
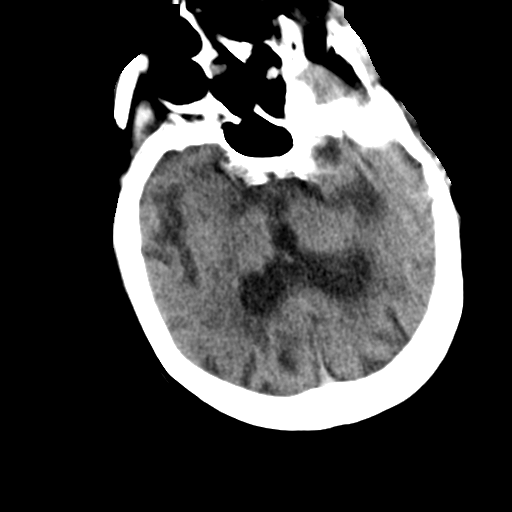
[im 29/39  brain]
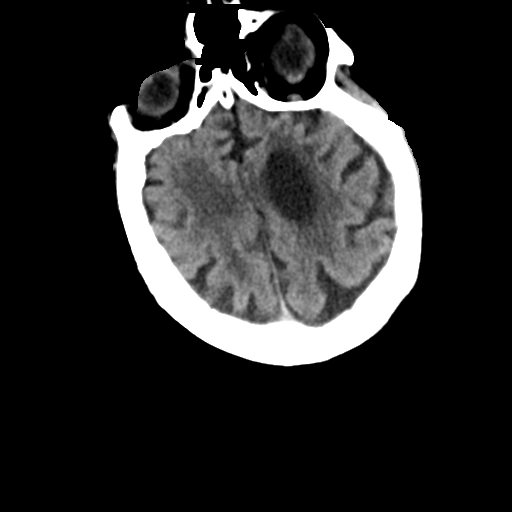

[Series 4: head 5.0 mpr ax · axial · 0.35mm/px · z∈[-135,-41]mm · 3 of 39 slices shown]
[im 10/39  brain]
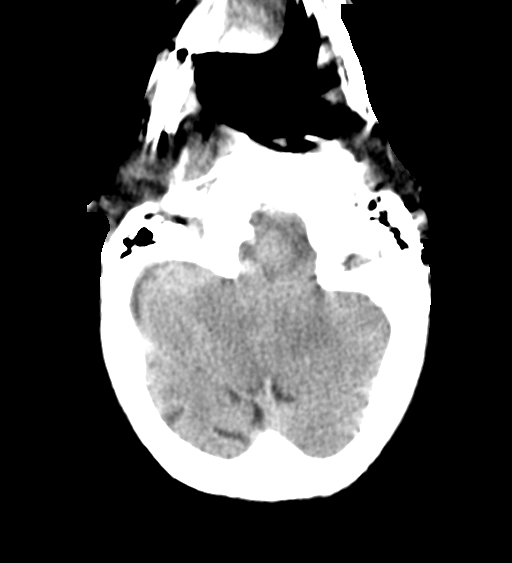
[im 20/39  brain]
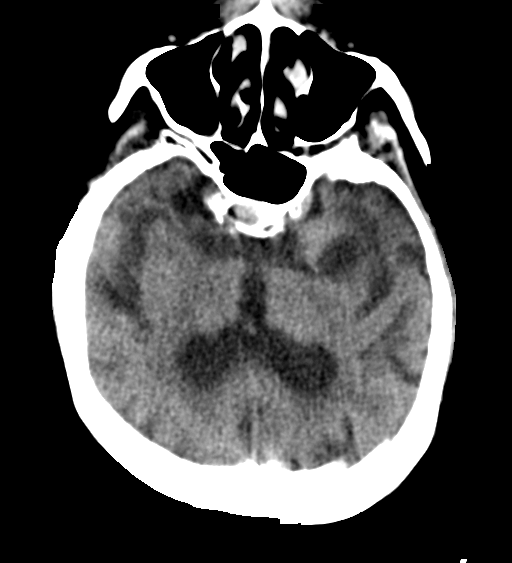
[im 29/39  brain]
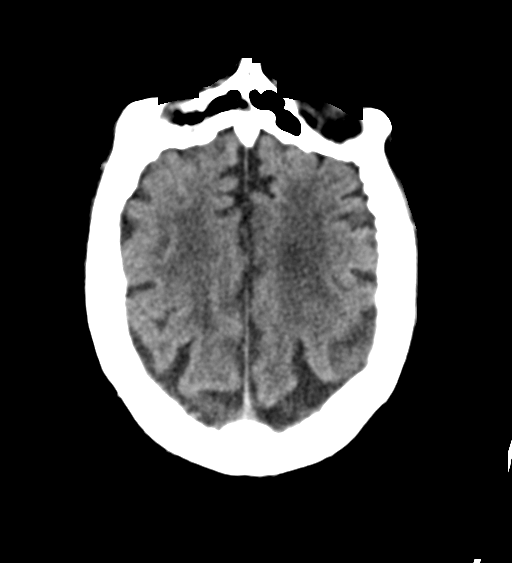

[Series 6: head 3.0 mpr cor · coronal · 0.40mm/px · 3 of 73 slices shown]
[im 19/73  brain]
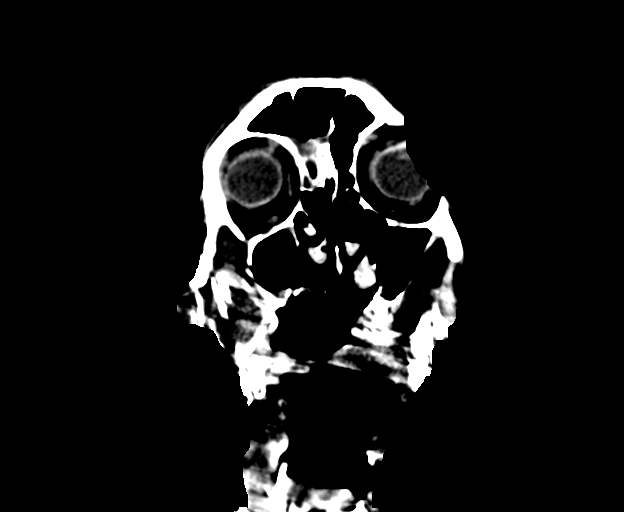
[im 37/73  brain]
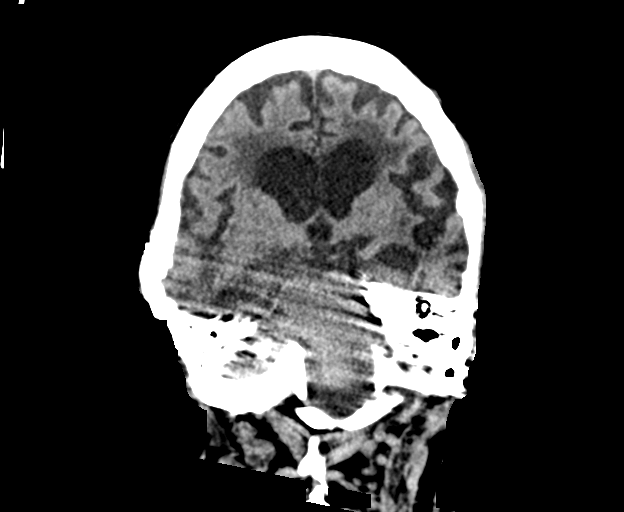
[im 55/73  brain]
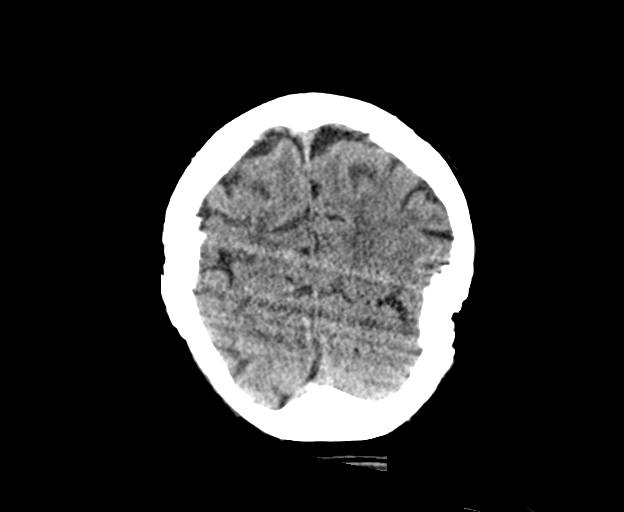

[Series 11: head 2.0 mpr ax · axial · 0.45mm/px · z∈[-157,-70]mm · 5 of 89 slices shown]
[im 9/89  brain]
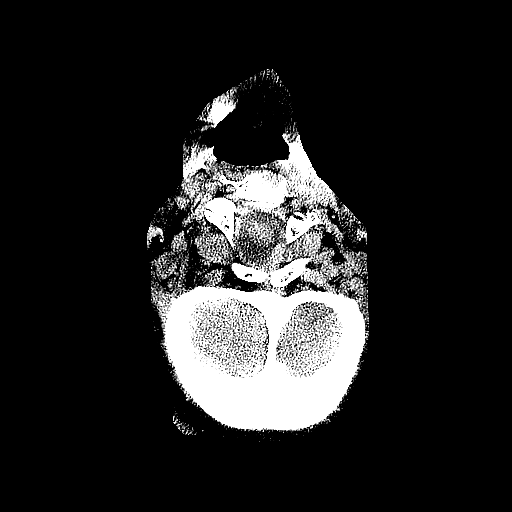
[im 18/89  brain]
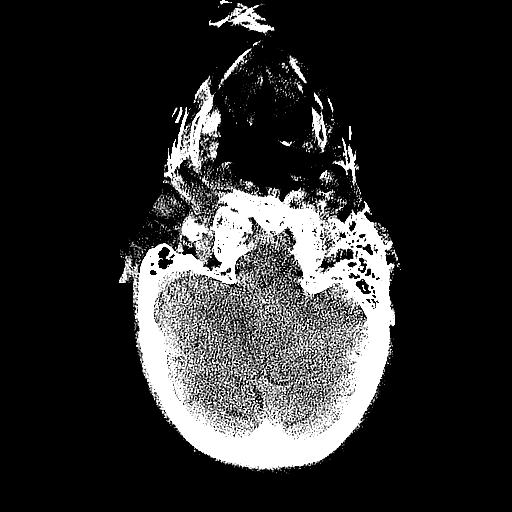
[im 27/89  brain]
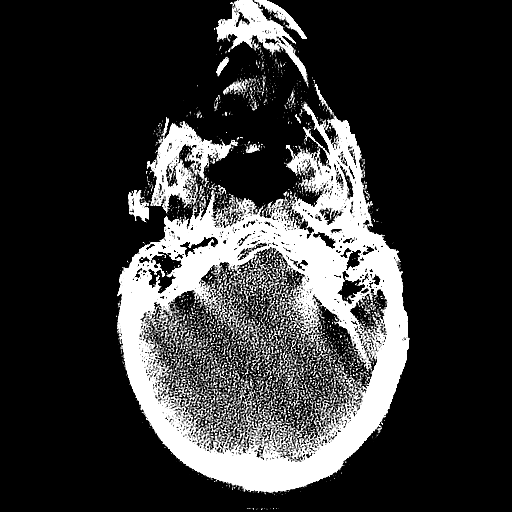
[im 36/89  brain]
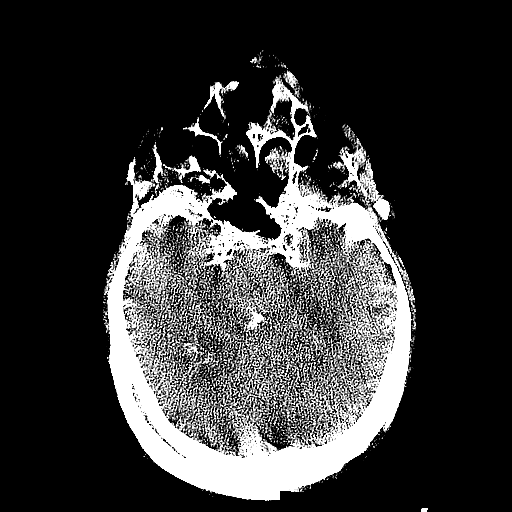
[im 53/89  brain]
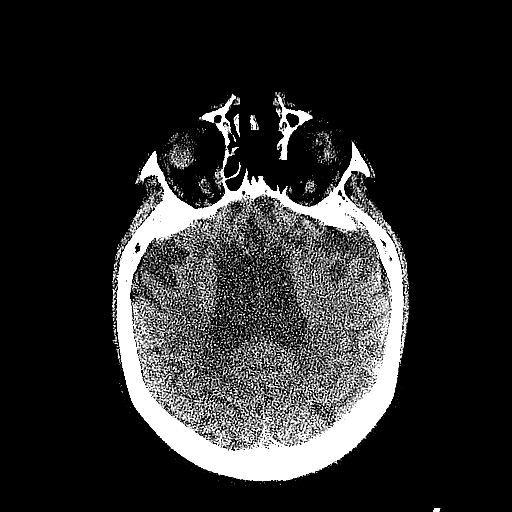

[Series 13: head 3.0 mpr sag · sagittal · 0.33mm/px · 2 of 67 slices shown]
[im 23/67  brain]
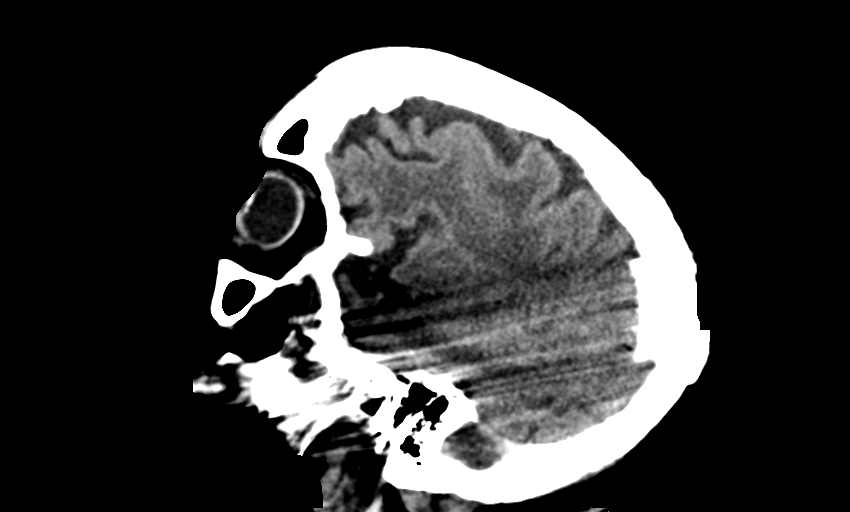
[im 45/67  brain]
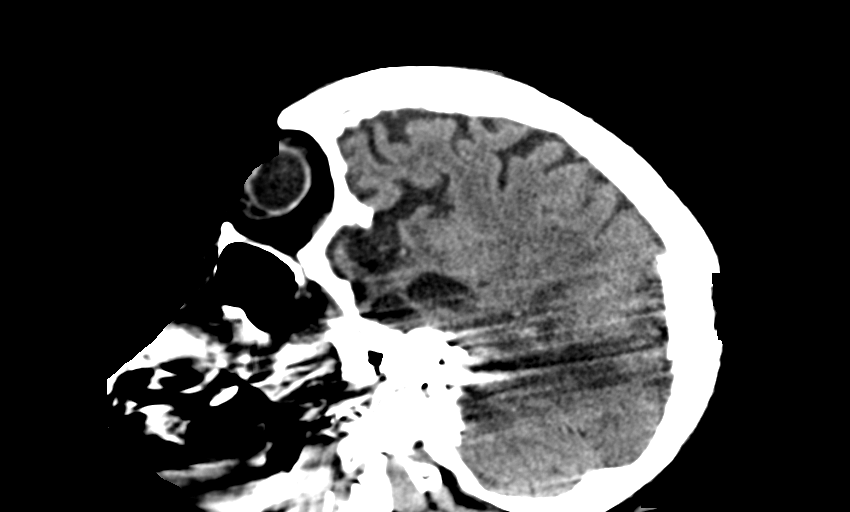

[16 of 47 positions shown; findings below may reference images not displayed]

FINDINGS: Brain: Motion artifact makes this study considerably less than
optimal. There is moderate diffuse atrophy. There is no intracranial
mass, hemorrhage, extra-axial fluid collection, or midline shift.
There is extensive small vessel disease throughout the centra
semiovale bilaterally. No obvious acute infarct is evident with the
admitted limitations due to motion artifact.

Vascular: No hyperdense vessel is demonstrable on this study. There
is calcification in each carotid siphon region.

Skull: Bony calvarium appears intact with limitations due to patient
motion.

Sinuses/Orbits: There is opacification in the posterior sphenoid
sinus region as well as in the posterior right maxillary antrum.
There is opacification and thickening in multiple ethmoid air cells
as well as in the right frontal sinus. Orbits appear symmetric
bilaterally.

Other: Mastoids appear grossly clear with limitations of motion.
IMPRESSION: Study limited due to patient motion.

Atrophy with supratentorial small vessel disease. No acute infarct
evident with patient motion making assessment less than optimal. No
hemorrhage or mass.

Foci of arterial vascular calcification noted. Areas of paranasal
sinus disease noted at several sites.

## 2019-08-17 DEATH — deceased
